# Patient Record
Sex: Female | Born: 1980 | Race: White | Hispanic: No | Smoking: Never smoker
Health system: Southern US, Community
[De-identification: ages and names within clinical notes are randomized; demographics above are authoritative.]

## PROBLEM LIST (undated history)

## (undated) DIAGNOSIS — F419 Anxiety disorder, unspecified: Secondary | ICD-10-CM

## (undated) DIAGNOSIS — F1020 Alcohol dependence, uncomplicated: Secondary | ICD-10-CM

## (undated) DIAGNOSIS — F319 Bipolar disorder, unspecified: Secondary | ICD-10-CM

## (undated) DIAGNOSIS — R569 Unspecified convulsions: Secondary | ICD-10-CM

## (undated) DIAGNOSIS — F4310 Post-traumatic stress disorder, unspecified: Secondary | ICD-10-CM

## (undated) DIAGNOSIS — F32A Depression, unspecified: Secondary | ICD-10-CM

## (undated) DIAGNOSIS — F329 Major depressive disorder, single episode, unspecified: Secondary | ICD-10-CM

## (undated) DIAGNOSIS — G44229 Chronic tension-type headache, not intractable: Secondary | ICD-10-CM

## (undated) HISTORY — PX: EXCISIONAL HEMORRHOIDECTOMY: SHX1541

## (undated) HISTORY — PX: OSTEOCHONDROMA EXCISION: SHX2137

## (undated) HISTORY — PX: REFRACTIVE SURGERY: SHX103

---

## 2013-12-31 ENCOUNTER — Inpatient Hospital Stay (HOSPITAL_COMMUNITY)
Admission: EM | Admit: 2013-12-31 | Discharge: 2014-01-02 | DRG: 641 | Disposition: A | Discharge status: Home / Self Care | Payer: Federal, State, Local not specified - PPO | Attending: Internal Medicine | Admitting: Internal Medicine

## 2013-12-31 ENCOUNTER — Encounter (HOSPITAL_COMMUNITY): Payer: Self-pay | Admitting: Oncology

## 2013-12-31 DIAGNOSIS — K219 Gastro-esophageal reflux disease without esophagitis: Secondary | ICD-10-CM | POA: Diagnosis present

## 2013-12-31 DIAGNOSIS — F1010 Alcohol abuse, uncomplicated: Secondary | ICD-10-CM | POA: Diagnosis present

## 2013-12-31 DIAGNOSIS — F10939 Alcohol use, unspecified with withdrawal, unspecified: Secondary | ICD-10-CM | POA: Diagnosis present

## 2013-12-31 DIAGNOSIS — E8729 Other acidosis: Secondary | ICD-10-CM

## 2013-12-31 DIAGNOSIS — E872 Acidosis, unspecified: Secondary | ICD-10-CM | POA: Diagnosis present

## 2013-12-31 DIAGNOSIS — F4310 Post-traumatic stress disorder, unspecified: Secondary | ICD-10-CM | POA: Diagnosis present

## 2013-12-31 DIAGNOSIS — G8929 Other chronic pain: Secondary | ICD-10-CM | POA: Diagnosis present

## 2013-12-31 DIAGNOSIS — G252 Other specified forms of tremor: Secondary | ICD-10-CM | POA: Diagnosis present

## 2013-12-31 DIAGNOSIS — F10239 Alcohol dependence with withdrawal, unspecified: Secondary | ICD-10-CM | POA: Diagnosis present

## 2013-12-31 DIAGNOSIS — F10980 Alcohol use, unspecified with alcohol-induced anxiety disorder: Secondary | ICD-10-CM | POA: Diagnosis present

## 2013-12-31 HISTORY — DX: Anxiety disorder, unspecified: F41.9

## 2013-12-31 HISTORY — DX: Post-traumatic stress disorder, unspecified: F43.10

## 2013-12-31 HISTORY — DX: Alcohol dependence, uncomplicated: F10.20

## 2013-12-31 LAB — CBC WITH DIFFERENTIAL/PLATELET
BASOS PCT: 1 % (ref 0–1)
Basophils Absolute: 0 10*3/uL (ref 0.0–0.1)
Eosinophils Absolute: 0 10*3/uL (ref 0.0–0.7)
Eosinophils Relative: 0 % (ref 0–5)
HCT: 40.6 % (ref 36.0–46.0)
HEMOGLOBIN: 14 g/dL (ref 12.0–15.0)
Lymphocytes Relative: 38 % (ref 12–46)
Lymphs Abs: 2.9 10*3/uL (ref 0.7–4.0)
MCH: 30.5 pg (ref 26.0–34.0)
MCHC: 34.5 g/dL (ref 30.0–36.0)
MCV: 88.5 fL (ref 78.0–100.0)
Monocytes Absolute: 0.5 10*3/uL (ref 0.1–1.0)
Monocytes Relative: 7 % (ref 3–12)
Neutro Abs: 4.2 10*3/uL (ref 1.7–7.7)
Neutrophils Relative %: 54 % (ref 43–77)
Platelets: 274 10*3/uL (ref 150–400)
RBC: 4.59 MIL/uL (ref 3.87–5.11)
RDW: 14.1 % (ref 11.5–15.5)
WBC: 7.7 10*3/uL (ref 4.0–10.5)

## 2013-12-31 LAB — URINE MICROSCOPIC-ADD ON

## 2013-12-31 LAB — BASIC METABOLIC PANEL
Anion gap: 30 — ABNORMAL HIGH (ref 5–15)
BUN: 18 mg/dL (ref 6–23)
CHLORIDE: 90 meq/L — AB (ref 96–112)
CO2: 16 mEq/L — ABNORMAL LOW (ref 19–32)
CREATININE: 0.51 mg/dL (ref 0.50–1.10)
Calcium: 9.8 mg/dL (ref 8.4–10.5)
GFR calc non Af Amer: >90 mL/min (ref >90)
Glucose, Bld: 82 mg/dL (ref 70–99)
Potassium: 4.7 mEq/L (ref 3.7–5.3)
Sodium: 136 mEq/L — ABNORMAL LOW (ref 137–147)

## 2013-12-31 LAB — RAPID URINE DRUG SCREEN, HOSP PERFORMED
AMPHETAMINES: NOT DETECTED
BENZODIAZEPINES: NOT DETECTED
Barbiturates: NOT DETECTED
Cocaine: NOT DETECTED
OPIATES: NOT DETECTED
Tetrahydrocannabinol: NOT DETECTED

## 2013-12-31 LAB — URINALYSIS, ROUTINE W REFLEX MICROSCOPIC
Bilirubin Urine: NEGATIVE
GLUCOSE, UA: NEGATIVE mg/dL
Ketones, ur: 40 mg/dL — AB
Leukocytes, UA: NEGATIVE
Nitrite: NEGATIVE
Protein, ur: >300 mg/dL — AB
SPECIFIC GRAVITY, URINE: 1.02 (ref 1.005–1.030)
UROBILINOGEN UA: 0.2 mg/dL (ref 0.0–1.0)
pH: 5 (ref 5.0–8.0)

## 2013-12-31 LAB — BLOOD GAS, ARTERIAL
Acid-base deficit: 6.1 mmol/L — ABNORMAL HIGH (ref 0.0–2.0)
Bicarbonate: 17 mEq/L — ABNORMAL LOW (ref 20.0–24.0)
DRAWN BY: 232811
FIO2: 0.21 %
O2 Saturation: 96.6 %
PCO2 ART: 28.3 mmHg — AB (ref 35.0–45.0)
PH ART: 7.398 (ref 7.350–7.450)
Patient temperature: 98.7
TCO2: 15.2 mmol/L (ref 0–100)
pO2, Arterial: 95.4 mmHg (ref 80.0–100.0)

## 2013-12-31 LAB — PREGNANCY, URINE: Preg Test, Ur: NEGATIVE

## 2013-12-31 LAB — I-STAT CG4 LACTIC ACID, ED: LACTIC ACID, VENOUS: 9.23 mmol/L — AB (ref 0.5–2.2)

## 2013-12-31 LAB — ETHANOL: Alcohol, Ethyl (B): 187 mg/dL — ABNORMAL HIGH (ref 0–11)

## 2013-12-31 MED ORDER — ONDANSETRON HCL 4 MG PO TABS: 4.0000 mg | ORAL_TABLET | Freq: Three times a day (TID) | ORAL | Status: DC | PRN

## 2013-12-31 MED ORDER — ALUM & MAG HYDROXIDE-SIMETH 200-200-20 MG/5ML PO SUSP: 30.0000 mL | ORAL | Status: DC | PRN

## 2013-12-31 MED ORDER — VITAMIN B-1 100 MG PO TABS
100.0000 mg | ORAL_TABLET | Freq: Every day | ORAL | Status: DC
  Administered 2013-12-31: 100 mg via ORAL
  Filled 2013-12-31: qty 1

## 2013-12-31 MED ORDER — SODIUM CHLORIDE 0.9 % IV BOLUS (SEPSIS)
1000.0000 mL | Freq: Once | INTRAVENOUS | Status: AC
  Administered 2013-12-31: 1000 mL via INTRAVENOUS

## 2013-12-31 MED ORDER — LORAZEPAM 1 MG PO TABS
1.0000 mg | ORAL_TABLET | Freq: Three times a day (TID) | ORAL | Status: DC | PRN
  Administered 2013-12-31: 1 mg via ORAL
  Filled 2013-12-31: qty 1

## 2013-12-31 MED ORDER — THIAMINE HCL 100 MG/ML IJ SOLN: 100.0000 mg | Freq: Every day | INTRAMUSCULAR | Status: DC

## 2013-12-31 MED ORDER — SODIUM CHLORIDE 0.9 % IV SOLN: INTRAVENOUS | Status: DC

## 2013-12-31 MED ORDER — NICOTINE 21 MG/24HR TD PT24: 21.0000 mg | MEDICATED_PATCH | Freq: Every day | TRANSDERMAL | Status: DC | PRN

## 2013-12-31 MED ORDER — LORAZEPAM 1 MG PO TABS: 0.0000 mg | ORAL_TABLET | Freq: Four times a day (QID) | ORAL | Status: DC

## 2013-12-31 MED ORDER — LORAZEPAM 1 MG PO TABS: 0.0000 mg | ORAL_TABLET | Freq: Two times a day (BID) | ORAL | Status: DC

## 2013-12-31 NOTE — ED Notes (Signed)
Per pt she is a recovering alcoholic and relapsed 2 weeks ago and has been drinking a fifth of vodka daily.  Pt denies any seizures with previous detox.  Pt reports that in the past she has had issues w/ tremors, high HR/BP and inability to ambulate.  Pt had her last drink today at 1400.  Pt is seeking help with detox.

## 2013-12-31 NOTE — ED Provider Notes (Signed)
CSN: [id number]     Arrival date & time 12/31/13  2044 History   First MD Initiated Contact with Patient 12/31/13 2107     Chief Complaint  Patient presents with  . Alcohol Problem    pt would like help w/ detox      HPI  Pt was seen at 2110. Per pt and her mother, c/o gradual onset and persistence of constant etoh abuse for the past 2 weeks. Pt states she has hx of alcohol abuse, but has been sober for the past 1 year. States she relapsed 2 weeks ago and has been drinking 1/5th of vodka per day and eating very little food. LD etoh approximately 1400 this afternoon. States she starts to withdraw from etoh approximately 4 hours after her last drink. Describes her withdrawal symptoms as "shaking," "nausea," "high HR/BP," "hallucinations," and "can't walk because I'm too shakey." States she is starting to feel all these symptoms now. Pt is requesting etoh detox. Denies hx of seizures related to her alcohol abuse. Denies CP/palpitations, no SOB/cough, no abd pain, no vomiting/diarrhea.      Past Medical History  Diagnosis Date  . Alcoholic   . Chronic back pain   . Anxiety   . Post traumatic stress disorder (PTSD)    Past Surgical History  Procedure Laterality Date  . Knee surgery Right     History  Substance Use Topics  . Smoking status: Never Smoker   . Smokeless tobacco: Not on file  . Alcohol Use: Yes     Comment: 1 fifth of vadka daily    Review of Systems ROS: Statement: All systems negative except as marked or noted in the HPI; Constitutional: Negative for fever and chills. +tremorous.; ; Eyes: Negative for eye pain, redness and discharge. ; ; ENMT: Negative for ear pain, hoarseness, nasal congestion, sinus pressure and sore throat. ; ; Cardiovascular: Negative for chest pain, palpitations, diaphoresis, dyspnea and peripheral edema. ; ; Respiratory: Negative for cough, wheezing and stridor. ; ; Gastrointestinal: +nausea. Negative for vomiting, diarrhea, abdominal pain, blood in  stool, hematemesis, jaundice and rectal bleeding. . ; ; Genitourinary: Negative for dysuria, flank pain and hematuria. ; ; Musculoskeletal: Negative for back pain and neck pain. Negative for swelling and trauma.; ; Skin: Negative for pruritus, rash, abrasions, blisters, bruising and skin lesion.; ; Neuro: Negative for headache, lightheadedness and neck stiffness. Negative for weakness, altered level of consciousness , altered mental status, extremity weakness, paresthesias, involuntary movement, seizure and syncope.; Psych:  +anxious. No SI, no SA, no HI, no hallucinations.   Allergies  Review of patient's allergies indicates no known allergies.  Home Medications   Prior to Admission medications   Medication Sig Start Date End Date Taking? Authorizing Provider  busPIRone (BUSPAR) 10 MG tablet Take 10 mg by mouth 3 (three) times daily.   Yes Historical Provider, MD  ibuprofen (ADVIL,MOTRIN) 200 MG tablet Take 400-600 mg by mouth every 6 (six) hours as needed for moderate pain.   Yes Historical Provider, MD  norethindrone-ethinyl estradiol (NECON,BREVICON,MODICON) 0.5-35 MG-MCG tablet Take 1 tablet by mouth daily.   Yes Historical Provider, MD  valACYclovir (VALTREX) 500 MG tablet Take 500 mg by mouth daily as needed (outbreaks).   Yes Historical Provider, MD   BP 130/86 mmHg  Pulse 113  Temp(Src) 98.6 F (37 C) (Oral)  Resp 16  Ht 5\' 4"  (1.626 m)  Wt 120 lb (54.432 kg)  BMI 20.59 kg/m2  SpO2 97%  LMP 12/17/2013 (Approximate) Physical Exam  2115: Physical examination:  Nursing notes reviewed; Vital signs and O2 SAT reviewed;  Constitutional: Well developed, Well nourished, Well hydrated, In no acute distress; Head:  Normocephalic, atraumatic; Eyes: EOMI, PERRL, No scleral icterus; ENMT: Mouth and pharynx normal, Mucous membranes moist; Neck: Supple, Full range of motion, No lymphadenopathy; Cardiovascular: Tachycardic rate and rhythm, No gallop; Respiratory: Breath sounds clear & equal  bilaterally, No rales, rhonchi, wheezes.  Speaking full sentences with ease, Normal respiratory effort/excursion; Chest: Nontender, Movement normal; Abdomen: Soft, Nontender, Nondistended, Normal bowel sounds; Genitourinary: No CVA tenderness; Extremities: Pulses normal, No tenderness, No edema, No calf edema or asymmetry.; Neuro: AA&Ox3, Major CN grossly intact.  Speech clear. No gross focal motor or sensory deficits in extremities.; Skin: Color normal, Warm, Dry.; Psych:  Anxious, shaking, tearful at times.     ED Course  Procedures     EKG Interpretation None      MDM  MDM Reviewed: nursing note and vitals Interpretation: labs   Results for orders placed or performed during the hospital encounter of 12/31/13  CBC with Differential  Result Value Ref Range   WBC 7.7 4.0 - 10.5 K/uL   RBC 4.59 3.87 - 5.11 MIL/uL   Hemoglobin 14.0 12.0 - 15.0 g/dL   HCT 16.1 09.6 - 04.5 %   MCV 88.5 78.0 - 100.0 fL   MCH 30.5 26.0 - 34.0 pg   MCHC 34.5 30.0 - 36.0 g/dL   RDW 40.9 81.1 - 91.4 %   Platelets 274 150 - 400 K/uL   Neutrophils Relative % 54 43 - 77 %   Neutro Abs 4.2 1.7 - 7.7 K/uL   Lymphocytes Relative 38 12 - 46 %   Lymphs Abs 2.9 0.7 - 4.0 K/uL   Monocytes Relative 7 3 - 12 %   Monocytes Absolute 0.5 0.1 - 1.0 K/uL   Eosinophils Relative 0 0 - 5 %   Eosinophils Absolute 0.0 0.0 - 0.7 K/uL   Basophils Relative 1 0 - 1 %   Basophils Absolute 0.0 0.0 - 0.1 K/uL  Basic metabolic panel  Result Value Ref Range   Sodium 136 (L) 137 - 147 mEq/L   Potassium 4.7 3.7 - 5.3 mEq/L   Chloride 90 (L) 96 - 112 mEq/L   CO2 16 (L) 19 - 32 mEq/L   Glucose, Bld 82 70 - 99 mg/dL   BUN 18 6 - 23 mg/dL   Creatinine, Ser 7.82 0.50 - 1.10 mg/dL   Calcium 9.8 8.4 - 95.6 mg/dL   GFR calc non Af Amer >90 >90 mL/min   GFR calc Af Amer >90 >90 mL/min   Anion gap 30 (H) 5 - 15  Urinalysis, Routine w reflex microscopic  Result Value Ref Range   Color, Urine YELLOW YELLOW   APPearance CLOUDY (A)  CLEAR   Specific Gravity, Urine 1.020 1.005 - 1.030   pH 5.0 5.0 - 8.0   Glucose, UA NEGATIVE NEGATIVE mg/dL   Hgb urine dipstick MODERATE (A) NEGATIVE   Bilirubin Urine NEGATIVE NEGATIVE   Ketones, ur 40 (A) NEGATIVE mg/dL   Protein, ur >213 (A) NEGATIVE mg/dL   Urobilinogen, UA 0.2 0.0 - 1.0 mg/dL   Nitrite NEGATIVE NEGATIVE   Leukocytes, UA NEGATIVE NEGATIVE  Pregnancy, urine  Result Value Ref Range   Preg Test, Ur NEGATIVE NEGATIVE  Urine rapid drug screen (hosp performed)  Result Value Ref Range   Opiates NONE DETECTED NONE DETECTED   Cocaine NONE DETECTED NONE DETECTED   Benzodiazepines NONE  DETECTED NONE DETECTED   Amphetamines NONE DETECTED NONE DETECTED   Tetrahydrocannabinol NONE DETECTED NONE DETECTED   Barbiturates NONE DETECTED NONE DETECTED  Ethanol  Result Value Ref Range   Alcohol, Ethyl (B) 187 (H) 0 - 11 mg/dL  Urine microscopic-add on  Result Value Ref Range   WBC, UA 3-6 <3 WBC/hpf   RBC / HPF 0-2 <3 RBC/hpf   Bacteria, UA RARE RARE   Casts GRANULAR CAST (A) NEGATIVE  Blood gas, arterial ([facility] ONLY)  Result Value Ref Range   FIO2 0.21 %   Delivery systems ROOM AIR    pH, Arterial 7.398 7.350 - 7.450   pCO2 arterial 28.3 (L) 35.0 - 45.0 mmHg   pO2, Arterial 95.4 80.0 - 100.0 mmHg   Bicarbonate 17.0 (L) 20.0 - 24.0 mEq/L   TCO2 15.2 0 - 100 mmol/L   Acid-base deficit 6.1 (H) 0.0 - 2.0 mmol/L   O2 Saturation 96.6 %   Patient temperature 98.7    Collection site LEFT BRACHIAL    Drawn by [id number]    Sample type ARTERIAL    Allens test (pass/fail) PASS PASS  Hepatic function panel  Result Value Ref Range   Total Protein 8.6 (H) 6.0 - 8.3 g/dL   Albumin 4.1 3.5 - 5.2 g/dL   AST 38 (H) 0 - 37 U/L   ALT 36 (H) 0 - 35 U/L   Alkaline Phosphatase 36 (L) 39 - 117 U/L   Total Bilirubin 0.4 0.3 - 1.2 mg/dL   Bilirubin, Direct <0.4<0.2 0.0 - 0.3 mg/dL   Indirect Bilirubin NOT CALCULATED 0.3 - 0.9 mg/dL  Lipase, blood  Result Value Ref Range   Lipase 44 11 -  59 U/L  Salicylate level  Result Value Ref Range   Salicylate Lvl <2.0 (L) 2.8 - 20.0 mg/dL  Acetaminophen level  Result Value Ref Range   Acetaminophen (Tylenol), Serum <15.0 10 - 30 ug/mL  I-Stat CG4 Lactic Acid, ED  Result Value Ref Range   Lactic Acid, Venous 9.23 (H) 0.5 - 2.2 mmol/L    2310:  TTS has evaluated pt and believes she will need inpt etoh detox, given her hx. Tachycardia improving from 130's to 110's after receiving ativan per CIWA protocol. Pt has also received thiamine. Labs have returned with elevated AG. Will medically admit. T/C to [facility] Dr. Allena KatzPatel, case discussed, including:  HPI, pertinent PM/SHx, VS/PE, dx testing, ED course and treatment:  Agreeable to come to ED for evaluation to admit.     Samuel JesterKathleen Dejay Kronk, DO 01/01/14 1336
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
## 2014-01-01 DIAGNOSIS — F10939 Alcohol use, unspecified with withdrawal, unspecified: Secondary | ICD-10-CM | POA: Diagnosis present

## 2014-01-01 DIAGNOSIS — G252 Other specified forms of tremor: Secondary | ICD-10-CM | POA: Diagnosis present

## 2014-01-01 DIAGNOSIS — F4310 Post-traumatic stress disorder, unspecified: Secondary | ICD-10-CM | POA: Diagnosis present

## 2014-01-01 DIAGNOSIS — F10239 Alcohol dependence with withdrawal, unspecified: Secondary | ICD-10-CM | POA: Diagnosis present

## 2014-01-01 DIAGNOSIS — F10980 Alcohol use, unspecified with alcohol-induced anxiety disorder: Secondary | ICD-10-CM | POA: Diagnosis present

## 2014-01-01 DIAGNOSIS — G8929 Other chronic pain: Secondary | ICD-10-CM | POA: Diagnosis present

## 2014-01-01 DIAGNOSIS — K219 Gastro-esophageal reflux disease without esophagitis: Secondary | ICD-10-CM | POA: Diagnosis present

## 2014-01-01 DIAGNOSIS — E872 Acidosis, unspecified: Secondary | ICD-10-CM | POA: Diagnosis present

## 2014-01-01 DIAGNOSIS — F1010 Alcohol abuse, uncomplicated: Secondary | ICD-10-CM

## 2014-01-01 LAB — CBC
HCT: 34.2 % — ABNORMAL LOW (ref 36.0–46.0)
Hemoglobin: 11.8 g/dL — ABNORMAL LOW (ref 12.0–15.0)
MCH: 30.6 pg (ref 26.0–34.0)
MCHC: 34.5 g/dL (ref 30.0–36.0)
MCV: 88.8 fL (ref 78.0–100.0)
Platelets: 202 10*3/uL (ref 150–400)
RBC: 3.85 MIL/uL — ABNORMAL LOW (ref 3.87–5.11)
RDW: 14.2 % (ref 11.5–15.5)
WBC: 7.2 10*3/uL (ref 4.0–10.5)

## 2014-01-01 LAB — HEPATIC FUNCTION PANEL
ALT: 36 U/L — ABNORMAL HIGH (ref 0–35)
AST: 38 U/L — AB (ref 0–37)
Albumin: 4.1 g/dL (ref 3.5–5.2)
Alkaline Phosphatase: 36 U/L — ABNORMAL LOW (ref 39–117)
BILIRUBIN TOTAL: 0.4 mg/dL (ref 0.3–1.2)
Bilirubin, Direct: <0.2 mg/dL (ref 0.0–0.3)
Total Protein: 8.6 g/dL — ABNORMAL HIGH (ref 6.0–8.3)

## 2014-01-01 LAB — COMPREHENSIVE METABOLIC PANEL
ALBUMIN: 3.2 g/dL — AB (ref 3.5–5.2)
ALK PHOS: 29 U/L — AB (ref 39–117)
ALT: 29 U/L (ref 0–35)
ANION GAP: 19 — AB (ref 5–15)
AST: 31 U/L (ref 0–37)
BILIRUBIN TOTAL: 0.6 mg/dL (ref 0.3–1.2)
BUN: 15 mg/dL (ref 6–23)
CALCIUM: 8.4 mg/dL (ref 8.4–10.5)
CO2: 20 meq/L (ref 19–32)
CREATININE: 0.46 mg/dL — AB (ref 0.50–1.10)
Chloride: 94 mEq/L — ABNORMAL LOW (ref 96–112)
GFR calc non Af Amer: >90 mL/min (ref >90)
Glucose, Bld: 73 mg/dL (ref 70–99)
Potassium: 4.1 mEq/L (ref 3.7–5.3)
SODIUM: 133 meq/L — AB (ref 137–147)
TOTAL PROTEIN: 6.7 g/dL (ref 6.0–8.3)

## 2014-01-01 LAB — PROTIME-INR
INR: 1.02 (ref 0.00–1.49)
Prothrombin Time: 13.5 seconds (ref 11.6–15.2)

## 2014-01-01 LAB — VOLATILES,BLD-ACETONE,ETHANOL,ISOPROP,METHANOL
Acetone, blood: NOT DETECTED
ETHANOL, BLOOD: 0.082
Isopropanol, blood: NOT DETECTED
Methanol, blood: NOT DETECTED

## 2014-01-01 LAB — SALICYLATE LEVEL: Salicylate Lvl: <2 mg/dL — ABNORMAL LOW (ref 2.8–20.0)

## 2014-01-01 LAB — LIPASE, BLOOD
Lipase: 44 U/L (ref 11–59)
Lipase: 57 U/L (ref 11–59)

## 2014-01-01 LAB — ACETAMINOPHEN LEVEL: Acetaminophen (Tylenol), Serum: <15 ug/mL (ref 10–30)

## 2014-01-01 LAB — OSMOLALITY: Osmolality: 338 mOsm/kg — ABNORMAL HIGH (ref 275–300)

## 2014-01-01 LAB — LACTIC ACID, PLASMA: Lactic Acid, Venous: 2.3 mmol/L — ABNORMAL HIGH (ref 0.5–2.2)

## 2014-01-01 MED ORDER — BUSPIRONE HCL 10 MG PO TABS
10.0000 mg | ORAL_TABLET | Freq: Three times a day (TID) | ORAL | Status: DC
  Administered 2014-01-01 – 2014-01-02 (×4): 10 mg via ORAL
  Filled 2014-01-01 (×5): qty 1

## 2014-01-01 MED ORDER — ONDANSETRON HCL 4 MG/2ML IJ SOLN: 4.0000 mg | Freq: Four times a day (QID) | INTRAMUSCULAR | Status: DC | PRN

## 2014-01-01 MED ORDER — VITAMIN B-1 100 MG PO TABS
100.0000 mg | ORAL_TABLET | Freq: Every day | ORAL | Status: DC
  Administered 2014-01-01 – 2014-01-02 (×2): 100 mg via ORAL
  Filled 2014-01-01 (×2): qty 1

## 2014-01-01 MED ORDER — ACETAMINOPHEN 650 MG RE SUPP: 650.0000 mg | Freq: Four times a day (QID) | RECTAL | Status: DC | PRN

## 2014-01-01 MED ORDER — SODIUM CHLORIDE 0.9 % IJ SOLN
3.0000 mL | Freq: Two times a day (BID) | INTRAMUSCULAR | Status: DC
  Administered 2014-01-01 – 2014-01-02 (×3): 3 mL via INTRAVENOUS

## 2014-01-01 MED ORDER — ONDANSETRON HCL 4 MG PO TABS: 4.0000 mg | ORAL_TABLET | Freq: Four times a day (QID) | ORAL | Status: DC | PRN

## 2014-01-01 MED ORDER — LORAZEPAM 2 MG/ML IJ SOLN: 0.0000 mg | Freq: Two times a day (BID) | INTRAMUSCULAR | Status: DC

## 2014-01-01 MED ORDER — PANTOPRAZOLE SODIUM 40 MG IV SOLR
40.0000 mg | Freq: Once | INTRAVENOUS | Status: AC
  Administered 2014-01-01: 40 mg via INTRAVENOUS
  Filled 2014-01-01: qty 40

## 2014-01-01 MED ORDER — SODIUM CHLORIDE 0.9 % IV BOLUS (SEPSIS)
1000.0000 mL | Freq: Once | INTRAVENOUS | Status: AC
  Administered 2014-01-01: 1000 mL via INTRAVENOUS

## 2014-01-01 MED ORDER — TRAMADOL HCL 50 MG PO TABS
50.0000 mg | ORAL_TABLET | Freq: Four times a day (QID) | ORAL | Status: DC | PRN
  Administered 2014-01-01: 50 mg via ORAL
  Filled 2014-01-01: qty 1

## 2014-01-01 MED ORDER — HEPARIN SODIUM (PORCINE) 5000 UNIT/ML IJ SOLN
5000.0000 [IU] | Freq: Three times a day (TID) | INTRAMUSCULAR | Status: DC
  Administered 2014-01-01 – 2014-01-02 (×4): 5000 [IU] via SUBCUTANEOUS
  Filled 2014-01-01 (×5): qty 1

## 2014-01-01 MED ORDER — FOLIC ACID 1 MG PO TABS
1.0000 mg | ORAL_TABLET | Freq: Every day | ORAL | Status: DC
  Administered 2014-01-01 – 2014-01-02 (×2): 1 mg via ORAL
  Filled 2014-01-01 (×2): qty 1

## 2014-01-01 MED ORDER — THIAMINE HCL 100 MG/ML IJ SOLN
Freq: Every day | INTRAVENOUS | Status: DC
  Administered 2014-01-01 – 2014-01-02 (×2): via INTRAVENOUS
  Filled 2014-01-01 (×2): qty 1000

## 2014-01-01 MED ORDER — LORAZEPAM 2 MG/ML IJ SOLN
1.0000 mg | Freq: Once | INTRAMUSCULAR | Status: AC
  Administered 2014-01-01: 1 mg via INTRAVENOUS

## 2014-01-01 MED ORDER — PANTOPRAZOLE SODIUM 40 MG PO TBEC
40.0000 mg | DELAYED_RELEASE_TABLET | Freq: Every day | ORAL | Status: DC
  Administered 2014-01-01 – 2014-01-02 (×2): 40 mg via ORAL
  Filled 2014-01-01 (×2): qty 1

## 2014-01-01 MED ORDER — ADULT MULTIVITAMIN W/MINERALS CH
1.0000 | ORAL_TABLET | Freq: Every day | ORAL | Status: DC
  Administered 2014-01-01 – 2014-01-02 (×2): 1 via ORAL
  Filled 2014-01-01 (×2): qty 1

## 2014-01-01 MED ORDER — ACETAMINOPHEN 325 MG PO TABS: 650.0000 mg | ORAL_TABLET | Freq: Four times a day (QID) | ORAL | Status: DC | PRN

## 2014-01-01 MED ORDER — LORAZEPAM 1 MG PO TABS: 1.0000 mg | ORAL_TABLET | Freq: Four times a day (QID) | ORAL | Status: DC | PRN

## 2014-01-01 MED ORDER — LORAZEPAM 2 MG/ML IJ SOLN
0.0000 mg | Freq: Four times a day (QID) | INTRAMUSCULAR | Status: DC
  Administered 2014-01-01 – 2014-01-02 (×3): 2 mg via INTRAVENOUS
  Filled 2014-01-01 (×5): qty 1

## 2014-01-01 MED ORDER — LORAZEPAM 2 MG/ML IJ SOLN
1.0000 mg | Freq: Four times a day (QID) | INTRAMUSCULAR | Status: DC | PRN
  Administered 2014-01-01 (×3): 1 mg via INTRAVENOUS
  Filled 2014-01-01 (×3): qty 1

## 2014-01-01 NOTE — Plan of Care (Signed)
Problem: Phase I Progression Outcomes Goal: Pain controlled with appropriate interventions Outcome: Completed/Met Date Met:  01/01/14     

## 2014-01-01 NOTE — H&P (Signed)
[facility] History and Physical  Patient: [name] Stewart  ZOX:[id number]  DOB: 05/24/1980  DOS: the patient was seen and examined on 01/01/2014 PCP: No primary care provider on file.  Chief Complaint: tremors and withdrawal  HPI: [name] Lollingrin Evon is a 33 y.o. female with Past medical history of alcohol abuse, anxiety, alcohol-induced pancreatitis history. The patient presented with alcohol withdrawal. She mentions that she was not taking any alcohol for last 1 year but she has recently moved and had a lot of stress and started drinking alcohol 2 weeks ago. She was drinking 1/5 of bottle of vodka on a daily basis. She denies any drug abuse or any smoking abuse. After drinking significant alcohol for a week she gradually decreased her alcohol intake and started having symptoms of withdrawal. She called her mother to visit her and in the meantime she was continuing to drink less alcohol. She started having symptoms of tremor and anxiety. She also complains of generalized abdominal pain as well as acid reflux. She had an episode of nausea as well as vomiting. She denies any shortness of breath cough or fever. She complains of some diarrhea and does not have any active bleeding. She denies any burning urination. She occasionally complains of dizziness.  The patient is coming from home And at her baseline independent for most of her ADL.  Review of Systems: as mentioned in the history of present illness.  A Comprehensive review of the other systems is negative.  Past Medical History  Diagnosis Date  . Alcoholic   . Chronic back pain   . Anxiety   . Post traumatic stress disorder (PTSD)    Past Surgical History  Procedure Laterality Date  . Knee surgery Right    Social History:  reports that she has never smoked. She does not have any smokeless tobacco history on file. She reports that she drinks alcohol. She reports that she does not use illicit drugs.  No Known Allergies  History reviewed. No  pertinent family history.  Prior to Admission medications   Medication Sig Start Date End Date Taking? Authorizing Provider  busPIRone (BUSPAR) 10 MG tablet Take 10 mg by mouth 3 (three) times daily.   Yes Historical Provider, MD  ibuprofen (ADVIL,MOTRIN) 200 MG tablet Take 400-600 mg by mouth every 6 (six) hours as needed for moderate pain.   Yes Historical Provider, MD  norethindrone-ethinyl estradiol (NECON,BREVICON,MODICON) 0.5-35 MG-MCG tablet Take 1 tablet by mouth daily.   Yes Historical Provider, MD  valACYclovir (VALTREX) 500 MG tablet Take 500 mg by mouth daily as needed (outbreaks).   Yes Historical Provider, MD    Physical Exam: Filed Vitals:   12/31/13 2127 01/01/14 0039 01/01/14 0052 01/01/14 0144  BP: 130/86 146/87 146/87 126/75  Pulse: 113 124 78 120  Temp:    98.5 F (36.9 C)  TempSrc:    Oral  Resp:  16  18  Height:    5\' 4"  (1.626 m)  Weight:    53.7 kg (118 lb 6.2 oz)  SpO2:  98%  98%    General: Alert, Awake and Oriented to Time, Place and Person. Appear in mild distress Eyes: PERRL ENT: Oral Mucosa clear moist. Neck: no JVD Cardiovascular: S1 and S2 Present, no Murmur, Peripheral Pulses Present Respiratory: Bilateral Air entry equal and Decreased, Clear to Auscultation, noCrackles, no wheezes Abdomen: Bowel Sound present, Soft and no tender Skin: no Rash Extremities: no Pedal edema, no calf tenderness Neurologic: Grossly no focal neuro deficit other than tremors  Labs on Admission:  CBC:  Recent Labs Lab 12/31/13 2137  WBC 7.7  NEUTROABS 4.2  HGB 14.0  HCT 40.6  MCV 88.5  PLT 274    CMP     Component Value Date/Time   NA 136* 12/31/2013 2137   K 4.7 12/31/2013 2137   CL 90* 12/31/2013 2137   CO2 16* 12/31/2013 2137   GLUCOSE 82 12/31/2013 2137   BUN 18 12/31/2013 2137   CREATININE 0.51 12/31/2013 2137   CALCIUM 9.8 12/31/2013 2137   PROT 8.6* 12/31/2013 2313   ALBUMIN 4.1 12/31/2013 2313   AST 38* 12/31/2013 2313   ALT 36*  12/31/2013 2313   ALKPHOS 36* 12/31/2013 2313   BILITOT 0.4 12/31/2013 2313   GFRNONAA >90 12/31/2013 2137   GFRAA >90 12/31/2013 2137     Recent Labs Lab 12/31/13 2313  LIPASE 44   No results for input(s): AMMONIA in the last 168 hours.  No results for input(s): CKTOTAL, CKMB, CKMBINDEX, TROPONINI in the last 168 hours. BNP (last 3 results) No results for input(s): PROBNP in the last 8760 hours.  Radiological Exams on Admission: No results found.  EKG: Independently reviewed. sinus tachycardia.  Assessment/Plan Principal Problem:   Metabolic acidosis Active Problems:   Alcohol withdrawal   Alcohol abuse   GERD (gastroesophageal reflux disease)   Coarse tremors   1. Metabolic acidosis The patient is presenting with complaints of alcohol withdrawal symptoms with tremors and anxiety. She requested detoxification in the ER. On further workup she was found to have metabolic acidosis with increased lactic acid level and sinus tachycardia. With this the patient is currently being admitted in the hospital on telemetry. I would continue hydrating her aggressively. Placing her on banana bag with thiamine and multivitamin as well as folic acid. Recheck lactic acid levels in the morning. Most likely etiology of her lactic acidosis is alcohol intake. Her LFTs are within normal limits suggesting no acute liver failure. Continue close monitoring.  2. Alcohol withdrawal. Placing the patient on CIWA protocol for alcohol withdrawal.  3. GERD. Protonix one dose IV and continuation of Protonix oral. Zofran as needed.  Advance goals of care discussion: Full code    DVT Prophylaxis: subcutaneous Heparin Nutrition: Liquid diet  Family Communication: Mother was present at bedside, opportunity was given to ask question and all questions were answered satisfactorily at the time of interview. Disposition: Admitted to inpatient in telemetry unit.  Author: Lynden OxfordPranav Patel, MD [facility] Pager: [phone] 01/01/2014,     If 7PM-7AM, please contact night-coverage [email or web address] Password [email or web address]

## 2014-01-01 NOTE — ED Notes (Signed)
Hospitalist at bedside 

## 2014-01-01 NOTE — Plan of Care (Signed)
Problem: Phase III Progression Outcomes Goal: Voiding independently Outcome: Completed/Met Date Met:  01/01/14     

## 2014-01-01 NOTE — Clinical Social Work Note (Signed)
   CSW met with pt and her mother at bedside.  Pt's mother flew in from [place] last night when pt was admitted to the ER  Pt recently moved to [place] from [place], got a new job and bought a new house.  Pt lives alone for the first time and stated that the stress of all the new things caused her fatigue and she did not work out which helps with her stress and anxiety.  Pt stated that she was taking medication for anxiety and stopped it when she began drinking about a week ago.  Pt likes her new job and home in [place] and wants to begin to work with a Social worker for her anxiety  CSW presented pt with a list of resources and outpatient treatment and pt agreed to call for an appointment before leaving the hospital  CSW will follow up if needed  .Dede Query, LCSW [facility] Clinical Social Worker - Weekend Coverage cell #: [phone]

## 2014-01-01 NOTE — Plan of Care (Signed)
Problem: Phase I Progression Outcomes Goal: Voiding-avoid urinary catheter unless indicated Outcome: Completed/Met Date Met:  01/01/14     

## 2014-01-01 NOTE — Plan of Care (Signed)
Problem: Phase III Progression Outcomes Goal: Foley discontinued Outcome: Not Applicable Date Met:  01/01/14     

## 2014-01-01 NOTE — Plan of Care (Signed)
Problem: Phase I Progression Outcomes Goal: OOB as tolerated unless otherwise ordered Outcome: Completed/Met Date Met:  01/01/14

## 2014-01-01 NOTE — Plan of Care (Signed)
Problem: Phase II Progression Outcomes Goal: Obtain order to discontinue catheter if appropriate Outcome: Not Applicable Date Met:  92/76/39

## 2014-01-01 NOTE — Progress Notes (Signed)
Pt admitted after midnight for alcohol withdrawal. Pt seen and examined at bedside this AM. She is hemodynamically stable with mild tremors on exam this AM. Will continue to keep on CIWA protocol. Repeat CMET and CBC in AM.  Debbora PrestoMAGICK-Lesbia Ottaway, MD  [facility] Pager [phone]  If 7PM-7AM, please contact night-coverage [email or web address] Password [email or web address]

## 2014-01-01 NOTE — Plan of Care (Signed)
Problem: Phase II Progression Outcomes Goal: Vital signs remain stable Outcome: Completed/Met Date Met:  01/01/14     

## 2014-01-01 NOTE — Progress Notes (Signed)
Patient reports feeling miserable. Feeling sweaty and having hallucinations. "Thought I was dreaming when I woke up and saw dolls all around in the room.". + tremors/restless. C/o numbness/itching on face. Paged MD oncall Dr Alvester MorinNewton and order received for additional ativan.

## 2014-01-01 NOTE — Plan of Care (Signed)
Problem: Phase II Progression Outcomes Goal: IV changed to normal saline lock Outcome: Completed/Met Date Met:  01/01/14     

## 2014-01-01 NOTE — Clinical Social Work Psychosocial (Signed)
  Clinical Social Work Department BRIEF PSYCHOSOCIAL ASSESSMENT 01/01/2014  Patient:  [name] Stewart, [name] Stewart     Account Number:  [id number]     Admit date:  12/31/2013  Clinical Social Worker:  Dede Query, CLINICAL SOCIAL WORKER  Date/Time:  01/01/2014 04:17 PM  Referred by:  Physician  Date Referred:  01/01/2014 Referred for  Substance Abuse   Other Referral:   Interview type:  Patient Other interview type:   and her mother Dorian Pod    PSYCHOSOCIAL DATA Living Status:  ALONE Admitted from facility:   Level of care:   Primary support name:  ellen barry Primary support relationship to patient:  PARENT Degree of support available:   high    CURRENT CONCERNS  Other Concerns:    SOCIAL WORK ASSESSMENT / PLAN CSW met with pt and her mother at bedside.  Pt discussed her moving from [place] to [place] about a month ago to a new job and also buying a new house.  Pt admits that she used to have problems with alcohol but had not done any drinking for a year and with the move, new job and new house her stress caused her anxiety and she stopped working out and began drinking.  Pt stated that she had been taking a medication for anxiety but when she began to drink she stopped her medication as well.  Pt stated that she loves her new job and home and she wants to "get myself straight" so I don't loose the house or the job.  Pt also went through a recent breakup with her boyfriend who she left in [place].  Pt's mother discussed her daughter's abilities and praised her for all that she does.  Pt's mother discussed having her own panic attacks in the past and stated that she "knows how they feel".  Pt is open to outpatient counseling for mental health and alcohol but stated that her anxiety is what she really needs help with. CSW provided pt with a list of outpatient treatment programs that she can attend when she is discharged.  Pt  and her mother stated that she would call these programs tomorrow so that she could  get an appointment upon discharge.  Pt's mother flew in from [place] and plans to stay for another day hoping her daughter will be discharged tomorrow.   Assessment/plan status:   Other assessment/ plan:   Information/referral to community resources:   provided pt with a list of outpatient substance/alcholol treatment services    PATIENT'S/FAMILY'S RESPONSE TO PLAN OF CARE: Pt was curled up in a ball when CSW first entered the room. Pt was open to talking about what was going on in her life and appeared to recognize her stressors and the fact that she does not have a support system in place here since she just moved here a month ago.  Pt appears open to seeking treatment and continuing her course of anxiety medication. Pt stated that she was doing too much and exhausted herself which left her unable to work out and this is usually her stress relief and helps with her anxiety.  Pt was sitting up and appeared more alert at end of interview.  Pt and her mother stated they appreciated the help with resources and would follow up with scheduling outpatient appointment tomorrow so that pt could have an appoiintment upon discharge. Marland Kitchen   Dede Query, LCSW [place] Worker - Weekend Coverage cell #: [phone]

## 2014-01-02 LAB — COMPREHENSIVE METABOLIC PANEL
ALT: 46 U/L — ABNORMAL HIGH (ref 0–35)
ANION GAP: 14 (ref 5–15)
AST: 54 U/L — ABNORMAL HIGH (ref 0–37)
Albumin: 3.5 g/dL (ref 3.5–5.2)
Alkaline Phosphatase: 40 U/L (ref 39–117)
BILIRUBIN TOTAL: 1 mg/dL (ref 0.3–1.2)
BUN: 16 mg/dL (ref 6–23)
CHLORIDE: 97 meq/L (ref 96–112)
CO2: 26 mEq/L (ref 19–32)
CREATININE: 0.51 mg/dL (ref 0.50–1.10)
Calcium: 9.7 mg/dL (ref 8.4–10.5)
GFR calc Af Amer: >90 mL/min (ref >90)
GLUCOSE: 127 mg/dL — AB (ref 70–99)
Potassium: 3.8 mEq/L (ref 3.7–5.3)
Sodium: 137 mEq/L (ref 137–147)
Total Protein: 7.5 g/dL (ref 6.0–8.3)

## 2014-01-02 LAB — CBC
HCT: 39.6 % (ref 36.0–46.0)
Hemoglobin: 13.3 g/dL (ref 12.0–15.0)
MCH: 30.4 pg (ref 26.0–34.0)
MCHC: 33.6 g/dL (ref 30.0–36.0)
MCV: 90.4 fL (ref 78.0–100.0)
Platelets: 203 10*3/uL (ref 150–400)
RBC: 4.38 MIL/uL (ref 3.87–5.11)
RDW: 13.8 % (ref 11.5–15.5)
WBC: 5.2 10*3/uL (ref 4.0–10.5)

## 2014-01-02 MED ORDER — LORAZEPAM 1 MG PO TABS: 1.0000 mg | ORAL_TABLET | Freq: Three times a day (TID) | ORAL | Status: DC

## 2014-01-02 MED ORDER — ONDANSETRON HCL 4 MG PO TABS: 4.0000 mg | ORAL_TABLET | Freq: Four times a day (QID) | ORAL | Status: DC | PRN

## 2014-01-02 MED ORDER — TRAMADOL HCL 50 MG PO TABS: 50.0000 mg | ORAL_TABLET | Freq: Four times a day (QID) | ORAL | Status: DC | PRN

## 2014-01-02 NOTE — Care Management Note (Signed)
    Page 1 of 1   01/02/2014     12:20:26 PM CARE MANAGEMENT NOTE 01/02/2014  Patient:  [name] Stewart Stewart,[name] Stewart   Account Number:  [id number]  Date Initiated:  01/02/2014  Documentation initiated by:  Lanier ClamMAHABIR,Kimani Hovis  Subjective/Objective Assessment:   33 Y/O F ADMITTED W/ETOH.ZO:XWRUHX:ETOH ABUSE.     Action/Plan:   FROM HOME.STATES SHE HAS HEALTH INSURANCE-BCBS.   Anticipated DC Date:  01/02/2014   Anticipated DC Plan:  HOME/SELF CARE  In-house referral  PCP / [facility]  Clinical Social Worker      DC Planning Services  CM consult      Choice offered to / List presented to:             Status of service:  Completed, signed off Medicare Important Message given?   (If response is "NO", the following Medicare IM given date fields will be blank) Date Medicare IM given:   Medicare IM given by:   Date Additional Medicare IM given:   Additional Medicare IM given by:    Discharge Disposition:  HOME/SELF CARE  Per UR Regulation:  Reviewed for med. necessity/level of care/duration of stay  If discussed at Long Length of Stay Meetings, dates discussed:    Comments:  01/02/14 Giacomo Valone RN,BSN NCM [phone] SPOKE TO PATIENT/MOTHER IN RM. STATED THAT SHE HAS HEALTH INSURANCE-BCBS-ENCOURAGED TO CONTACT ADMITTING TO INFORM OF HEALTH INSURANCE & TO PROVIDE THEM W/INFO NEEDED.MOTHER WILL F/U.PROVIDED W/PCP LISTING, $[facility] MED LIST.CSW FOLLOWING FOR OTPT RESOURCES. NO FURTHER D/C NEEDS.

## 2014-01-02 NOTE — Discharge Instructions (Signed)
Alcohol and Nutrition °Nutrition serves two purposes. It provides energy. It also maintains body structure and function. Food supplies energy. It also provides the building blocks needed to replace worn or damaged cells. Alcoholics often eat poorly. This limits their supply of essential nutrients. This affects energy supply and structure maintenance. Alcohol also affects the body's nutrients in: °· Digestion. °· Storage. °· Using and getting rid of waste products. °IMPAIRMENT OF NUTRIENT DIGESTION AND UTILIZATION  °· Once ingested, food must be broken down into small components (digested). Then it is available for energy. It helps maintain body structure and function. Digestion begins in the mouth. It continues in the stomach and intestines, with help from the pancreas. The nutrients from digested food are absorbed from the intestines into the blood. Then they are carried to the liver. The liver prepares nutrients for: °¨ Immediate use. °¨ Storage and future use. °· Alcohol inhibits the breakdown of nutrients into usable molecules. °¨ It decreases secretion of digestive enzymes from the pancreas. °· Alcohol impairs nutrient absorption by damaging the cells lining the stomach and intestines. °· It also interferes with moving some nutrients into the blood. °· In addition, nutritional deficiencies themselves may lead to further absorption problems. °¨ For example, folate deficiency changes the cells that line the small intestine. This impairs how water is absorbed. It also affects absorbed nutrients. These include glucose, sodium, and additional folate. °· Even if nutrients are digested and absorbed, alcohol can prevent them from being fully used. It changes their transport, storage, and excretion. Impaired utilization of nutrients by alcoholics is indicated by: °¨ Decreased liver stores of vitamins, such as vitamin A. °¨ Increased excretion of nutrients such as fat. °ALCOHOL AND ENERGY SUPPLY  °· Three basic  nutritional components found in food are: °¨ Carbohydrates. °¨ Proteins. °¨ Fats. °· These are used as energy. Some alcoholics take in as much as 50% of their total daily calories from alcohol. They often neglect important foods. °· Even when enough food is eaten, alcohol can impair the ways the body controls blood sugar (glucose) levels. It may either increase or decrease blood sugar. °¨ In non-diabetic alcoholics, increased blood sugar (hyperglycemia) is caused by poor insulin secretion. It is usually temporary. °¨ Decreased blood sugar (hypoglycemia) can cause serious injury even if this condition is short-lived. Low blood sugar can happen when a fasting or malnourished person drinks alcohol. When there is no food to supply energy, stored sugar is used up. The products of alcohol inhibit forming glucose from other compounds such as amino acids. As a result, alcohol causes the brain and other body tissue to lack glucose. It is needed for energy and function. °· Alcohol is an energy source. But how the body processes and uses the energy from alcohol is complex. Also, when alcohol is substituted for carbohydrates, subjects tend to lose weight. This indicates that they get less energy from alcohol than from food. °ALCOHOL - MAINTAINING CELL STRUCTURE AND FUNCTION  °Structure °Cells are made mostly of protein. So an adequate protein diet is important for maintaining cell structure. This is especially true if cells are being damaged. Research indicates that alcohol affects protein nutrition by causing impaired: °· Digestion of proteins to amino acids. °· Processing of amino acids by the small intestine and liver. °· Synthesis of proteins from amino acids. °· Protein secretion by the liver. °Function °Nutrients are essential for the body to function well. They provide the tools that the body needs to work well:  °·   Proteins. °· Vitamins. °· Minerals. °Alcohol can disrupt body function. It may cause nutrient  deficiencies. And it may interfere with the way nutrients are processed. °Vitamins °· Vitamins are essential to maintain growth and normal metabolism. They regulate many of the body`s processes. Chronic heavy drinking causes deficiencies in many vitamins. This is caused by eating less. And, in some cases, vitamins may be poorly absorbed. For example, alcohol inhibits fat absorption. It impairs how the vitamins A, E, and D are normally absorbed along with dietary fats. Not enough vitamin A may cause night blindness. Not enough vitamin D may cause softening of the bones. °· Some alcoholics lack vitamins A, C, D, E, K, and the B vitamins. These are all involved in wound healing and cell maintenance. In particular, because vitamin K is necessary for blood clotting, lacking that vitamin can cause delayed clotting. The result is excess bleeding. Lacking other vitamins involved in brain function may cause severe neurological damage. °Minerals °Deficiencies of minerals such as calcium, magnesium, iron, and zinc are common in alcoholics. The alcohol itself does not seem to affect how these minerals are absorbed. Rather, they seem to occur secondary to other alcohol-related problems, such as: °· Less calcium absorbed. °· Not enough magnesium. °· More urinary excretion. °· Vomiting. °· Diarrhea. °· Not enough iron due to gastrointestinal bleeding. °· Not enough zinc or losses related to other nutrient deficiencies. °· Mineral deficiencies can cause a variety of medical consequences. These range from calcium-related bone disease to zinc-related night blindness and skin lesions. °ALCOHOL, MALNUTRITION, AND MEDICAL COMPLICATIONS  °Liver Disease  °· Alcoholic liver damage is caused primarily by alcohol itself. But poor nutrition may increase the risk of alcohol-related liver damage. For example, nutrients normally found in the liver are known to be affected by drinking alcohol. These include carotenoids, which are the major  sources of vitamin A, and vitamin E compounds. Decreases in such nutrients may play some role in alcohol-related liver damage. °Pancreatitis °· Research suggests that malnutrition may increase the risk of developing alcoholic pancreatitis. Research suggests that a diet lacking in protein may increase alcohol's damaging effect on the pancreas. °Brain °· Nutritional deficiencies may have severe effects on brain function. These may be permanent. Specifically, thiamine deficiencies are often seen in alcoholics. They can cause severe neurological problems. These include: °¨ Impaired movement. °¨ Memory loss seen in Wernicke-Korsakoff syndrome. °Pregnancy °· Alcohol has toxic effects on fetal development. It causes alcohol-related birth defects. They include fetal alcohol syndrome. Alcohol itself is toxic to the fetus. Also, the nutritional deficiency can affect how the fetus develops. That may compound the risk of developmental damage. °· Nutritional needs during pregnancy are 10% to 30% greater than normal. Food intake can increase by as much as 140% to cover the needs of both mother and fetus. An alcoholic mother`s nutritional problems may adversely affect the nutrition of the fetus. And alcohol itself can also restrict nutrition flow to the fetus. °NUTRITIONAL STATUS OF ALCOHOLICS  °Techniques for assessing nutritional status include: °· Taking body measurements to estimate fat reserves. They include: °¨ Weight. °¨ Height. °¨ Mass. °¨ Skin fold thickness. °· Performing blood analysis to provide measurements of circulating: °¨ Proteins. °¨ Vitamins. °¨ Minerals. °· These techniques tend to be imprecise. For many nutrients, there is no clear "cut-off" point that would allow an accurate definition of deficiency. So assessing the nutritional status of alcoholics is limited by these techniques. Dietary status may provide information about the risk of developing nutritional problems.   Dietary status is assessed by: °¨ Taking  patients' dietary histories. °¨ Evaluating the amount and types of food they are eating. °· It is difficult to determine what exact amount of alcohol begins to have damaging effects on nutrition. In general, moderate drinkers have 2 drinks or less per day. They seem to be at little risk for nutritional problems. Various medical disorders begin to appear at greater levels. °· Research indicates that the majority of even the heaviest drinkers have few obvious nutritional deficiencies. Many alcoholics who are hospitalized for medical complications of their disease do have severe malnutrition. Alcoholics tend to eat poorly. Often they eat less than the amounts of food necessary to provide enough: °¨ Carbohydrates. °¨ Protein. °¨ Fat. °¨ Vitamins A and C. °¨ B vitamins. °¨ Minerals like calcium and iron. °Of major concern is alcohol's effect on digesting food and use of nutrients. It may shift a mildly malnourished person toward severe malnutrition. °Document Released: 11/07/2004 Document Revised: 04/07/2011 Document Reviewed: 04/23/2005 °ExitCare® Patient Information ©2015 [employer]. This information is not intended to replace advice given to you by your health care provider. Make sure you discuss any questions you have with your health care provider. ° °

## 2014-01-02 NOTE — Discharge Summary (Signed)
Physician Discharge Summary  [name] Stewart ZOX:[id number] DOB: 01/20/1981 DOA: 12/31/2013  PCP: No primary care provider on file.  Admit date: 12/31/2013 Discharge date: 01/02/2014  Recommendations for Outpatient Follow-up:  1. Pt will need to follow up with PCP in 2-3 weeks post discharge 2. Please obtain CMP to evaluate electrolytes and kidney function 3. Please also check CBC to evaluate Hg and Hct levels  Discharge Diagnoses:  Principal Problem:   Metabolic acidosis Active Problems:   Alcohol withdrawal   Alcohol abuse   GERD (gastroesophageal reflux disease)   Coarse tremors    Discharge Condition: Stable  Diet recommendation: Heart healthy diet discussed in details   History of present illness:  33 y.o. female with Past medical history of alcohol abuse, anxiety, alcohol-induced pancreatitis history. The patient presented with alcohol withdrawal. She was drinking 1/5 of bottle of vodka on a daily basis. She denies any drug abuse or any smoking abuse.  Hospital Course:  Principal Problem:   Metabolic acidosis - secondary to alcohol induced dehydration, starvation ketoacidosis  - resolved with IVF and oral intake  Active Problems:   Alcohol withdrawal - resolved    Alcohol abuse - pt willing to continue outpatient rehab    GERD (gastroesophageal reflux disease) - stable   Procedures/Studies:  None  Consultations:  None Antibiotics:  None   Discharge Exam: Filed Vitals:   01/02/14 0455  BP: 129/80  Pulse: 84  Temp: 98 F (36.7 C)  Resp: 20   Filed Vitals:   01/01/14 2040 01/01/14 2148 01/02/14 0455 01/02/14 0700  BP:  109/71 129/80   Pulse:  86 84   Temp:  98.1 F (36.7 C) 98 F (36.7 C)   TempSrc:  Oral Oral   Resp:  20 20   Height:      Weight:    52.1 kg (114 lb 13.8 oz)  SpO2: 96% 98% 99%     General: Pt is alert, follows commands appropriately, not in acute distress Cardiovascular: Regular rate and rhythm, S1/S2 +, no murmurs, no rubs, no  gallops Respiratory: Clear to auscultation bilaterally, no wheezing, no crackles, no rhonchi Abdominal: Soft, non tender, non distended, bowel sounds +, no guarding Extremities: no edema, no cyanosis, pulses palpable bilaterally DP and PT Neuro: Grossly nonfocal  Discharge Instructions  Discharge Instructions    Diet - low sodium heart healthy    Complete by:  As directed      Increase activity slowly    Complete by:  As directed             Medication List    TAKE these medications        busPIRone 10 MG tablet  Commonly known as:  BUSPAR  Take 10 mg by mouth 3 (three) times daily.     ibuprofen 200 MG tablet  Commonly known as:  ADVIL,MOTRIN  Take 400-600 mg by mouth every 6 (six) hours as needed for moderate pain.     LORazepam 1 MG tablet  Commonly known as:  ATIVAN  Take 1 tablet (1 mg total) by mouth every 8 (eight) hours.     norethindrone-ethinyl estradiol 0.5-35 MG-MCG tablet  Commonly known as:  NECON,BREVICON,MODICON  Take 1 tablet by mouth daily.     ondansetron 4 MG tablet  Commonly known as:  ZOFRAN  Take 1 tablet (4 mg total) by mouth every 6 (six) hours as needed for nausea.     traMADol 50 MG tablet  Commonly known as:  Janean SarkULTRAM  Take 1 tablet (50 mg total) by mouth every 6 (six) hours as needed for moderate pain.     valACYclovir 500 MG tablet  Commonly known as:  VALTREX  Take 500 mg by mouth daily as needed (outbreaks).          The results of significant diagnostics from this hospitalization (including imaging, microbiology, ancillary and laboratory) are listed below for reference.     Microbiology: No results found for this or any previous visit (from the past 240 hour(s)).   Labs: Basic Metabolic Panel:  Recent Labs Lab 12/31/13 2137 01/01/14 0507 01/02/14 0435  NA 136* 133* 137  K 4.7 4.1 3.8  CL 90* 94* 97  CO2 16* 20 26  GLUCOSE 82 73 127*  BUN 18 15 16   CREATININE 0.51 0.46* 0.51  CALCIUM 9.8 8.4 9.7   Liver  Function Tests:  Recent Labs Lab 12/31/13 2313 01/01/14 0507 01/02/14 0435  AST 38* 31 54*  ALT 36* 29 46*  ALKPHOS 36* 29* 40  BILITOT 0.4 0.6 1.0  PROT 8.6* 6.7 7.5  ALBUMIN 4.1 3.2* 3.5    Recent Labs Lab 12/31/13 2313 01/01/14 0507  LIPASE 44 57   No results for input(s): AMMONIA in the last 168 hours. CBC:  Recent Labs Lab 12/31/13 2137 01/01/14 0507 01/02/14 0435  WBC 7.7 7.2 5.2  NEUTROABS 4.2  --   --   HGB 14.0 11.8* 13.3  HCT 40.6 34.2* 39.6  MCV 88.5 88.8 90.4  PLT 274 202 203   Cardiac Enzymes: No results for input(s): CKTOTAL, CKMB, CKMBINDEX, TROPONINI in the last 168 hours. BNP: BNP (last 3 results) No results for input(s): PROBNP in the last 8760 hours. CBG: No results for input(s): GLUCAP in the last 168 hours.   SIGNED: Time coordinating discharge: Over 30 minutes  Debbora PrestoMAGICK-Kellianne Ek, MD  [facility] 01/02/2014, 11:18 AM Pager [phone]  If 7PM-7AM, please contact night-coverage [email or web address] Password [email or web address]

## 2014-06-16 ENCOUNTER — Encounter (HOSPITAL_COMMUNITY): Payer: Self-pay | Admitting: Emergency Medicine

## 2014-06-16 ENCOUNTER — Emergency Department (HOSPITAL_COMMUNITY)
Admission: EM | Admit: 2014-06-16 | Discharge: 2014-06-16 | Disposition: A | Discharge status: Home / Self Care | Payer: Federal, State, Local not specified - PPO | Attending: Emergency Medicine | Admitting: Emergency Medicine

## 2014-06-16 ENCOUNTER — Emergency Department (HOSPITAL_COMMUNITY): Payer: Federal, State, Local not specified - PPO

## 2014-06-16 DIAGNOSIS — F419 Anxiety disorder, unspecified: Secondary | ICD-10-CM | POA: Insufficient documentation

## 2014-06-16 DIAGNOSIS — Z793 Long term (current) use of hormonal contraceptives: Secondary | ICD-10-CM | POA: Diagnosis not present

## 2014-06-16 DIAGNOSIS — F4310 Post-traumatic stress disorder, unspecified: Secondary | ICD-10-CM | POA: Diagnosis not present

## 2014-06-16 DIAGNOSIS — R000 Tachycardia, unspecified: Secondary | ICD-10-CM | POA: Insufficient documentation

## 2014-06-16 DIAGNOSIS — F10129 Alcohol abuse with intoxication, unspecified: Secondary | ICD-10-CM | POA: Diagnosis present

## 2014-06-16 DIAGNOSIS — G8929 Other chronic pain: Secondary | ICD-10-CM | POA: Diagnosis not present

## 2014-06-16 DIAGNOSIS — F1010 Alcohol abuse, uncomplicated: Secondary | ICD-10-CM

## 2014-06-16 DIAGNOSIS — R079 Chest pain, unspecified: Secondary | ICD-10-CM | POA: Insufficient documentation

## 2014-06-16 DIAGNOSIS — Z79899 Other long term (current) drug therapy: Secondary | ICD-10-CM | POA: Insufficient documentation

## 2014-06-16 LAB — BASIC METABOLIC PANEL
ANION GAP: 12 (ref 5–15)
BUN: 14 mg/dL (ref 6–20)
CALCIUM: 8.5 mg/dL — AB (ref 8.9–10.3)
CO2: 23 mmol/L (ref 22–32)
CREATININE: 0.41 mg/dL — AB (ref 0.44–1.00)
Chloride: 103 mmol/L (ref 101–111)
GFR calc Af Amer: >60 mL/min (ref >60)
Glucose, Bld: 133 mg/dL — ABNORMAL HIGH (ref 65–99)
Potassium: 3.5 mmol/L (ref 3.5–5.1)
Sodium: 138 mmol/L (ref 135–145)

## 2014-06-16 LAB — CBC
HCT: 37.6 % (ref 36.0–46.0)
Hemoglobin: 12.8 g/dL (ref 12.0–15.0)
MCH: 30.8 pg (ref 26.0–34.0)
MCHC: 34 g/dL (ref 30.0–36.0)
MCV: 90.6 fL (ref 78.0–100.0)
PLATELETS: 270 10*3/uL (ref 150–400)
RBC: 4.15 MIL/uL (ref 3.87–5.11)
RDW: 16.4 % — ABNORMAL HIGH (ref 11.5–15.5)
WBC: 5.6 10*3/uL (ref 4.0–10.5)

## 2014-06-16 LAB — I-STAT TROPONIN, ED: Troponin i, poc: 0 ng/mL (ref 0.00–0.08)

## 2014-06-16 MED ORDER — CHLORDIAZEPOXIDE HCL 25 MG PO CAPS: ORAL_CAPSULE | ORAL | Status: DC

## 2014-06-16 MED ORDER — LORAZEPAM 2 MG/ML IJ SOLN
1.0000 mg | Freq: Once | INTRAMUSCULAR | Status: AC
  Administered 2014-06-16: 1 mg via INTRAMUSCULAR
  Filled 2014-06-16: qty 1

## 2014-06-16 NOTE — ED Notes (Signed)
Pt from home c/o  Central chest pain and requesting detox form alcohol.  She reports last drink was about 1  1/2 hours hours.  Pt reports that she feels as if she is having palpitations. Pt states I was trying to detox at home but " I couldn't"

## 2014-06-16 NOTE — ED Provider Notes (Signed)
CSN: [id number]     Arrival date & time 06/16/14  2040 History   First MD Initiated Contact with Patient 06/16/14 2210     Chief Complaint  Patient presents with  . Alcohol Intoxication  . Chest Pain     (Consider location/radiation/quality/duration/timing/severity/associated sxs/prior Treatment) HPI Comments: Patient here requesting detox from alcohol. Last drink was just prior to arrival. She admits to having between 4 or 5 shots of vodka. Denies any suicidal or homicidal ideations. Does note increased anxiety which has resulted in some chest tightness. Denies any anginal features to this. Has been in alcohol rehabilitation twice before in the past. Symptoms have been progressively worse over the past few days. She has had emesis 1 which was nonbilious or bloody. Denies any severe abdominal pain.  Patient is a 34 y.o. female presenting with intoxication and chest pain. The history is provided by the patient.  Alcohol Intoxication Associated symptoms include chest pain.  Chest Pain   Past Medical History  Diagnosis Date  . Alcoholic   . Chronic back pain   . Anxiety   . Post traumatic stress disorder (PTSD)    Past Surgical History  Procedure Laterality Date  . Knee surgery Right    No family history on file. History  Substance Use Topics  . Smoking status: Never Smoker   . Smokeless tobacco: Not on file  . Alcohol Use: Yes     Comment: 1 fifth of vadka daily   OB History    No data available     Review of Systems  Cardiovascular: Positive for chest pain.  All other systems reviewed and are negative.     Allergies  Naltrexone and Baclofen  Home Medications   Prior to Admission medications   Medication Sig Start Date End Date Taking? Authorizing Provider  busPIRone (BUSPAR) 10 MG tablet Take 10 mg by mouth 3 (three) times daily.   Yes Historical Provider, MD  busPIRone (BUSPAR) 15 MG tablet Take 15 mg by mouth 3 (three) times daily.   Yes Historical Provider,  MD  norethindrone-ethinyl estradiol (NECON,BREVICON,MODICON) 0.5-35 MG-MCG tablet Take 1 tablet by mouth daily.   Yes Historical Provider, MD  sertraline (ZOLOFT) 100 MG tablet Take 100 mg by mouth daily.   Yes Historical Provider, MD  traZODone (DESYREL) 100 MG tablet Take 100 mg by mouth at bedtime.   Yes Historical Provider, MD  ibuprofen (ADVIL,MOTRIN) 200 MG tablet Take 400-600 mg by mouth every 6 (six) hours as needed for moderate pain.    Historical Provider, MD  LORazepam (ATIVAN) 1 MG tablet Take 1 tablet (1 mg total) by mouth every 8 (eight) hours. Patient not taking: Reported on 06/16/2014 01/02/14   Dorothea OgleIskra M Myers, MD  ondansetron (ZOFRAN) 4 MG tablet Take 1 tablet (4 mg total) by mouth every 6 (six) hours as needed for nausea. Patient not taking: Reported on 06/16/2014 01/02/14   Dorothea OgleIskra M Myers, MD  traMADol (ULTRAM) 50 MG tablet Take 1 tablet (50 mg total) by mouth every 6 (six) hours as needed for moderate pain. Patient not taking: Reported on 06/16/2014 01/02/14   Dorothea OgleIskra M Myers, MD  valACYclovir (VALTREX) 500 MG tablet Take 500 mg by mouth daily as needed (outbreaks).    Historical Provider, MD   BP 135/87 mmHg  Pulse 120  Resp 20  SpO2 98% Physical Exam  Constitutional: She is oriented to person, place, and time. She appears well-developed and well-nourished.  Non-toxic appearance. No distress.  HENT:  Head: Normocephalic  and atraumatic.  Eyes: Conjunctivae, EOM and lids are normal. Pupils are equal, round, and reactive to light.  Neck: Normal range of motion. Neck supple. No tracheal deviation present. No thyroid mass present.  Cardiovascular: Regular rhythm and normal heart sounds.  Tachycardia present.  Exam reveals no gallop.   No murmur heard. Pulmonary/Chest: Effort normal and breath sounds normal. No stridor. No respiratory distress. She has no decreased breath sounds. She has no wheezes. She has no rhonchi. She has no rales.  Abdominal: Soft. Normal appearance and bowel  sounds are normal. She exhibits no distension. There is no tenderness. There is no rebound and no CVA tenderness.  Musculoskeletal: Normal range of motion. She exhibits no edema or tenderness.  Neurological: She is alert and oriented to person, place, and time. She has normal strength. No cranial nerve deficit or sensory deficit. GCS eye subscore is 4. GCS verbal subscore is 5. GCS motor subscore is 6.  Skin: Skin is warm and dry. No abrasion and no rash noted.  Psychiatric: Her speech is normal and behavior is normal. Her affect is blunt. She expresses no suicidal plans and no homicidal plans.  Nursing note and vitals reviewed.   ED Course  Procedures (including critical care time) Labs Review Labs Reviewed  CBC - Abnormal; Notable for the following:    RDW 16.4 (*)    All other components within normal limits  BASIC METABOLIC PANEL - Abnormal; Notable for the following:    Glucose, Bld 133 (*)    Creatinine, Ser 0.41 (*)    Calcium 8.5 (*)    All other components within normal limits  Rosezena SensorI-STAT TROPOININ, ED    Imaging Review Dg Chest Port 1 View  06/16/2014   CLINICAL DATA:  Chest pain.  Alcohol intoxication.  EXAM: PORTABLE CHEST - 1 VIEW  COMPARISON:  None.  FINDINGS: The cardiomediastinal contours are normal. The lungs are clear. Pulmonary vasculature is normal. No consolidation, pleural effusion, or pneumothorax. No acute osseous abnormalities are seen.  IMPRESSION: No acute pulmonary process.   Electronically Signed   By: Rubye OaksMelanie  Ehinger M.D.   On: 06/16/2014 21:32     EKG Interpretation   Date/Time:  Friday Jun 16 2014 20:49:27 EDT Ventricular Rate:  119 PR Interval:  145 QRS Duration: 76 QT Interval:  298 QTC Calculation: 419 R Axis:   75 Text Interpretation:  Sinus tachycardia Borderline T abnormalities,  inferior leads Baseline wander in lead(s) V5 Confirmed by Jolie Strohecker  MD,  Yoshiaki Kreuser ([id number]) on 06/16/2014 10:10:36 PM      MDM   Final diagnoses:  None    Ration  has no resting tremor at this time. She is not hallucinating. She is not going to active alcohol draw. Mild tachycardia noted likely from recent alcohol use. She was given Ativan 1 mg here IM. We given prescription for Librium as well as follow-up instructions. She is in the company of her neighbor who is an alcohol counselor and will be with her tonight. She has no acute psychiatric emergency at this time. Return precautions given    Lorre NickAnthony Olukemi Panchal, MD 06/16/14 2244

## 2014-06-16 NOTE — ED Notes (Signed)
Called patient to triage no answer

## 2014-06-16 NOTE — Discharge Instructions (Signed)
Alcohol Use Disorder °Alcohol use disorder is a mental disorder. It is not a one-time incident of heavy drinking. Alcohol use disorder is the excessive and uncontrollable use of alcohol over time that leads to problems with functioning in one or more areas of daily living. People with this disorder risk harming themselves and others when they drink to excess. Alcohol use disorder also can cause other mental disorders, such as mood and anxiety disorders, and serious physical problems. People with alcohol use disorder often misuse other drugs.  °Alcohol use disorder is common and widespread. Some people with this disorder drink alcohol to cope with or escape from negative life events. Others drink to relieve chronic pain or symptoms of mental illness. People with a family history of alcohol use disorder are at higher risk of losing control and using alcohol to excess.  °SYMPTOMS  °Signs and symptoms of alcohol use disorder may include the following:  °· Consumption of alcohol in larger amounts or over a longer period of time than intended. °· Multiple unsuccessful attempts to cut down or control alcohol use.   °· A great deal of time spent obtaining alcohol, using alcohol, or recovering from the effects of alcohol (hangover). °· A strong desire or urge to use alcohol (cravings).   °· Continued use of alcohol despite problems at work, school, or home because of alcohol use.   °· Continued use of alcohol despite problems in relationships because of alcohol use. °· Continued use of alcohol in situations when it is physically hazardous, such as driving a car. °· Continued use of alcohol despite awareness of a physical or psychological problem that is likely related to alcohol use. Physical problems related to alcohol use can involve the brain, heart, liver, stomach, and intestines. Psychological problems related to alcohol use include intoxication, depression, anxiety, psychosis, delirium, and dementia.   °· The need for  increased amounts of alcohol to achieve the same desired effect, or a decreased effect from the consumption of the same amount of alcohol (tolerance). °· Withdrawal symptoms upon reducing or stopping alcohol use, or alcohol use to reduce or avoid withdrawal symptoms. Withdrawal symptoms include: °¨ Racing heart. °¨ Hand tremor. °¨ Difficulty sleeping. °¨ Nausea. °¨ Vomiting. °¨ Hallucinations. °¨ Restlessness. °¨ Seizures. °DIAGNOSIS °Alcohol use disorder is diagnosed through an assessment by your health care provider. Your health care provider may start by asking three or four questions to screen for excessive or problematic alcohol use. To confirm a diagnosis of alcohol use disorder, at least two symptoms must be present within a 12-month period. The severity of alcohol use disorder depends on the number of symptoms: °· Mild--two or three. °· Moderate--four or five. °· Severe--six or more. °Your health care provider may perform a physical exam or use results from lab tests to see if you have physical problems resulting from alcohol use. Your health care provider may refer you to a mental health professional for evaluation. °TREATMENT  °Some people with alcohol use disorder are able to reduce their alcohol use to low-risk levels. Some people with alcohol use disorder need to quit drinking alcohol. When necessary, mental health professionals with specialized training in substance use treatment can help. Your health care provider can help you decide how severe your alcohol use disorder is and what type of treatment you need. The following forms of treatment are available:  °· Detoxification. Detoxification involves the use of prescription medicines to prevent alcohol withdrawal symptoms in the first week after quitting. This is important for people with a history of symptoms   of withdrawal and for heavy drinkers who are likely to have withdrawal symptoms. Alcohol withdrawal can be dangerous and, in severe cases, cause  death. Detoxification is usually provided in a hospital or in-patient substance use treatment facility. °· Counseling or talk therapy. Talk therapy is provided by substance use treatment counselors. It addresses the reasons people use alcohol and ways to keep them from drinking again. The goals of talk therapy are to help people with alcohol use disorder find healthy activities and ways to cope with life stress, to identify and avoid triggers for alcohol use, and to handle cravings, which can cause relapse. °· Medicines. Different medicines can help treat alcohol use disorder through the following actions: °¨ Decrease alcohol cravings. °¨ Decrease the positive reward response felt from alcohol use. °¨ Produce an uncomfortable physical reaction when alcohol is used (aversion therapy). °· Support groups. Support groups are run by people who have quit drinking. They provide emotional support, advice, and guidance. °These forms of treatment are often combined. Some people with alcohol use disorder benefit from intensive combination treatment provided by specialized substance use treatment centers. Both inpatient and outpatient treatment programs are available. °Document Released: 02/21/2004 Document Revised: 05/30/2013 Document Reviewed: 04/22/2012 °ExitCare® Patient Information ©2015 [employer]. This information is not intended to replace advice given to you by your health care provider. Make sure you discuss any questions you have with your health care provider. °Substance Abuse Treatment Programs ° °Intensive Outpatient Programs °[facility]     °[address]      °[place], [place]                   °[phone]      ° °[facility] °[address] °[place], [place] °[phone] ° °[facility]     °(Inpatient and outpatient)     °[address]           °[phone]   ° °[facility] °336-288-1484 (Suboxone and  Methadone) ° °[address]      °[place], [place] [address]      °336-882-2125      ° °[address] °[place], [place] °852-3033 ° °[facility] (Outpatient/Inpatient, [facility])    °(insurance only) [phone]      °       °[facility]Groups & Residential) °[place], [place] °336-389-1413 ° °   °[facility]     °[address]     °[place], [place]      °[phone]      ° °[facility] (for caregivers and family) °[address] °[place], [place] °336-299-4655 ° ° ° ° ° °Residential Treatment Programs °[facility]      °[address], [place], [place] [address]  °([phone]      ° °[facility] °[address] [place], [place] [address] °[phone] ° °[facility]        °336-248-8914      ° °[facility] °1-800-659-3381 ° °[facility]             °[address]                                         °[place], [place]                                                °  877-615-2722 or 336-784-9470                              ° °[facility] [place] °[address] °[place], [address] °1.877.941.8954 ° °[facility]    °[address]      °[place], [place]     °[phone]      ° °[facility] °[address] °[place], [place] °336-285-9073 ° °[facility]   °[address]     °[place], [place] [address]     °336-899-1550      °Admissions: 8am-3pm M-F ° °[facility] °[address] °, [place] °336-227-7417 ° °[facility]: Residential Program (90 Days)   °[place], [place]      °336-725-8389 or 800-758-6077    ° °[facility] °[place], [place] °(Walk in Hours over the weekend or by referral) ° °[facility] °[address], [place], [place] [address] °(336) [phone] ° °C[facility]is Mobile: [facility]:  1-877-626-1772 (for crisis response 24 hours a day) °[facility] Hotline:       1-800-256-2452 °Outpatient Psychiatry and Counseling ° °[facility]: Mobile Crisis Management 24 hours:  1-877-626-1772 ° °[facility] sliding scale fee and walk in schedule: M-F 8am-12pm/1pm-3pm °[address]  °[place], [place] [address] °[phone] ° °[facility] °[address] °[place], [place] [address] °336-703-9650 ° °[facility] ([facility] known [facility])- new patient walk-in appointments available Monday - Friday 8am -3pm.          °[address] °[place], [place] [address] °336-676-6840 or crisis line- 336-676-6905 ° °[facility] Intensive Outpatient Therapy Program °[address] °[place], [place] [address] °336-832-9804 ° °[facility]                  °Crisis Services      °[phone]      °[address]     °[place], [place] [address]                ° °[facility]   °[facility] °[phone] °[address] °[place], [place] [address] ° ° °[facility]          °[address]  °[place], [place] [address]       °(336) [phone] ° °[facility] °[address] °[place], [place] [address] °336-292-1510 ° °[facility]    °[address]    °[place], [place] [address]     °[phone]      ° °Parish McKinney, MD     °[address]     °[place] [address]     °336-282-1251     °  °[facility] °[address] °[place] [address] ° °[facility]     °[address]     °[place], [place]      °336-542-2076      ° °[facility] °Shamsher Ahluwalia, MD °[address] °[place], [place] [address] °[phone] ° °[facility]     °[address]     °[place], [place] [address]     °336-274-1237      ° °[facility] °[address] °[place],  [address] °336-854-4450 °Resources  for   Temporary Residential Assistance/Crisis Centers ° °DAY CENTERS °[facility] °M-F 8am-3pm   °[address] [place], [place] [address]   336-332-0824 °Services include: laundry, barbering, support groups, case management, phone  & computer access, showers, AA/NA mtgs, mental health/substance abuse nurse, job skills class, disability information, VA assistance, spiritual classes, etc.  ° °HOMELESS SHELTERS ° °[facility]     °[facility]   °[address], [place]     °336.271.5959       °       °[facility] (women and children)       °[address] °[place], [place] [address] °336-275-0820 °[email or web address] for application and process °Application Required ° °[facility]   °[address]    °[place] [address]     °[phone]       °             °[facility] °[address] °[place], [place] [address] °336.273.5572 °336-235-0363(schedule application appt.) °Application Required ° °[facility] (women only)    °[address]     °[place], [place] [address]     °336-884-1039      °Intake starts 6pm daily °Need valid ID, SSC, & Police report °[facility] [place] °[address] °[place], [place] °336-881-5420 °Application Required ° °[facility] (men only)     °[address]      °[place], [place]     °[phone]      ° °[facility] °(Pregnant women only) °[address] °[place], [place] °336-275-0206 ° °[facility]      °[address]      °[place], [place] [address]     °[phone]      °       °[facility] °[address] °[place], [place] °336-723-1848 °90 day commitment/SA/Application process ° °[facility](men only)     °[address]     °[place], [place]     °[phone]       °Check-in at 7pm     °       °[facility] °[address] °[place], [place]  [address] °336-248-6684 °Men/Women/Women and Children must be there by 7 pm ° °[facility] °[place], [place] °336-722-8721                ° °

## 2014-12-12 ENCOUNTER — Emergency Department (HOSPITAL_COMMUNITY)
Admission: EM | Admit: 2014-12-12 | Discharge: 2014-12-12 | Disposition: A | Discharge status: Home / Self Care | Payer: Federal, State, Local not specified - PPO | Attending: Emergency Medicine | Admitting: Emergency Medicine

## 2014-12-12 ENCOUNTER — Encounter (HOSPITAL_COMMUNITY): Payer: Self-pay | Admitting: Emergency Medicine

## 2014-12-12 DIAGNOSIS — F10120 Alcohol abuse with intoxication, uncomplicated: Secondary | ICD-10-CM | POA: Diagnosis not present

## 2014-12-12 DIAGNOSIS — F419 Anxiety disorder, unspecified: Secondary | ICD-10-CM | POA: Insufficient documentation

## 2014-12-12 DIAGNOSIS — G8929 Other chronic pain: Secondary | ICD-10-CM | POA: Insufficient documentation

## 2014-12-12 DIAGNOSIS — Z79899 Other long term (current) drug therapy: Secondary | ICD-10-CM | POA: Diagnosis not present

## 2014-12-12 DIAGNOSIS — F4310 Post-traumatic stress disorder, unspecified: Secondary | ICD-10-CM | POA: Insufficient documentation

## 2014-12-12 DIAGNOSIS — F10129 Alcohol abuse with intoxication, unspecified: Secondary | ICD-10-CM | POA: Diagnosis present

## 2014-12-12 DIAGNOSIS — F10920 Alcohol use, unspecified with intoxication, uncomplicated: Secondary | ICD-10-CM

## 2014-12-12 NOTE — ED Notes (Signed)
Per EMS: pt intoxicated, has had 1/2 bottle of vodka. Pt wants rehab, last rehab 8/25 and started drinking right after that.

## 2014-12-12 NOTE — Discharge Instructions (Signed)
Continue going to your AA meetings.  Try to refrain from drinking.  If you decide you would like to do rehab again, i have attached resources and contact information for you.   Emergency Department Resource Guide 1) Find a Doctor and Pay Out of Pocket Although you won't have to find out who is covered by your insurance plan, it is a good idea to ask around and get recommendations. You will then need to call the office and see if the doctor you have chosen will accept you as a new patient and what types of options they offer for patients who are self-pay. Some doctors offer discounts or will set up payment plans for their patients who do not have insurance, but you will need to ask so you aren't surprised when you get to your appointment.  2) Contact Your Local Health Department Not all health departments have doctors that can see patients for sick visits, but many do, so it is worth a call to see if yours does. If you don't know where your local health department is, you can check in your phone book. The CDC also has a tool to help you locate your state's health department, and many state websites also have listings of all of their local health departments.  3) Find a Walk-in Clinic If your illness is not likely to be very severe or complicated, you may want to try a walk in clinic. These are popping up all over the country in pharmacies, drugstores, and shopping centers. They're usually staffed by nurse practitioners or physician assistants that have been trained to treat common illnesses and complaints. They're usually fairly quick and inexpensive. However, if you have serious medical issues or chronic medical problems, these are probably not your best option.  No Primary Care Doctor: - Call Health Connect at  [phone] - they can help you locate a primary care doctor that  accepts your insurance, provides certain services, etc. - Physician Referral Service- [phone]  Chronic Pain  Problems: Organization         Address  Phone   Notes  [facility]  [phone] Patients need to be referred by their primary care doctor.   Medication Assistance: Organization         Address  Phone   Notes  [facility] [address] [place], [place] [address] [phone] --Must be a resident of [place] [facility] -- Must have NO insurance coverage whatsoever (no Medicaid/ Medicare, etc.) -- The pt. MUST have a primary care doctor that directs their care regularly and follows them in the community   MedAssist  [phone]   [employer]  [phone]    Agencies that provide inexpensive medical care: Organization         Address  Phone   Notes  [facility]  [phone]   [facility]    [phone]   [facility] [address] [place], [place] [address] [phone]   [facility] [address], [place] [phone]   [facility]anned [facility]    [phone]   [facility]    [phone]   [facility]  [address], [place] Phone:  [phone], Fax:  [phone] Hours of Operation:  9 am - 6 pm, M-F.  Also accepts Medicaid/Medicare and self-pay.  [facility]  [address], [place] Phone: [phone], Fax: [phone]. Hours of Operation:  8:30 am - 5:30 pm, M-F.  Also accepts Medicaid and self-pay.  °[facility] [place] [address], [place] Phone: [phone]   °[facility] [address], [place], [place] [phone], Ext. [phone] Mondays & Thursdays: 7-9 AM.  First 15 patients are seen on a first come, first serve basis. °  ° °Medicaid-accepting [place] Providers: ° °Organization         Address  Phone   Notes  °[facility] [address], [place] [phone] Also  accepts self-pay patients.  °[facility] [address], [place] ° [phone]   °[facility] [address], [place] [phone]   °[facility] [address] [place] [address], [place] [phone]   °Veita Bland [address], [place]  ° [phone] Only accepts [place] Access Medicaid patients after they have their name applied to their card.  ° °Self-Pay (no insurance) in [place]: ° °Organization         Address  Phone   Notes  °Sickle Cell Patients, [facility] [address], [place] [phone]   °[facility] [address], [place] [phone]   °[facility] [place] ° [address], [place] [phone]   °[facility]/Dr. Osei-Bonsu ° [address] [place] [address], [place] or [address], [place] [phone] Phone number for both [place] and [place] locations is the same.  °[facility] [address], [place] [phone]   °[facility] [address] [place] [address], [place] or [address] [phone] °([phone]) [phone]   °[facility] [address], [place] [phone], phone; [phone], fax Sees patients 1st and 3rd Saturday of every month.  Must not qualify for public or private insurance (i.e. Medicaid, Medicare, [place] Health Choice, Veterans' Benefits) • Household income should be no more than 200% of the poverty level •The clinic cannot treat you if you are pregnant or think you are pregnant • Sexually transmitted diseases are not treated at the clinic.  ° ° °Dental Care: °Organization         Address  Phone  Notes  °[place] [facility] [address], [place] [phone] Accepts children up to age 21 who are enrolled in Medicaid or [place] Health Choice; pregnant  women with a Medicaid card; and children who have applied for Medicaid or [place] Health Choice, but were declined, whose parents can pay a reduced fee at time of service.  °[place] [facility] [place]  [address], [place] [phone]) [phone] Accepts children up to age 21 who are enrolled in Medicaid or [place] Health Choice; pregnant women with a Medicaid card; and children who have applied for Medicaid or  Health Choice, but were declined, whose parents can pay a reduced fee at time of service.  °[facility] ° [address],  [phone] Patients are seen by appointment only. Walk-ins are not accepted. [facility] will see patients 18 years of age and older. °Monday - Tuesday (8am-5pm) °Most Wednesdays (8:30-5pm) °$30 per visit, cash only  °[facility]  [address], [facility] [phone] Patients are seen by appointment only. Walk-ins are not accepted. [place] will see patients 60 years of age and older. One Wednesday Evening (Monthly: Volunteer Based).  $30 per visit, cash only  [place]  [phone] for adults; Children under age 4, call Graduate Pediatric Dentistry at [phone]. Children aged 49-14, please call [phone] to request a pediatric application.  Dental services are provided in all areas of dental care including fillings, crowns and bridges, complete and partial dentures, implants, gum treatment, root canals, and extractions. Preventive care is also provided. Treatment is provided to both adults and children. Patients are selected via a lottery and there is often a waiting list.   [facility] [address] [place]  [phone] [email or web address]   [facility] [address] [place], [place] [phone], Ext. [phone] Second and Fourth Thursday of each month, opens at 6:30 AM; Clinic ends at 9 AM.  Patients are  seen on a first-come first-served basis, and a limited number are seen during each clinic.   [facility]  [address] Hillard Danker [place], [place] [phone]   Eligibility Requirements You must have lived in [place], [place], or [place] counties for at least the last three months.   You cannot be eligible for state or federal sponsored [employer], including [employer], [place], or [employer].   You generally cannot be eligible for healthcare insurance through your employer.    How to apply: Eligibility screenings are held every Tuesday and Wednesday afternoon from 1:00 pm until 4:00 pm. You do not need an appointment for the interview!  [facility] [address] [place], [place]   [place] Department  [place]  [phone]    Behavioral Health Resources in the Community: Intensive Outpatient Programs Organization         Address  Phone  Notes  [facility] [place]. [address], [place], [place] [phone]   [facility] [address], [place], [place]   [facility]: [facility] [address] [place] [address], [place], [place]   [place] [address],  [place], [place] or [phone]   Substance Abuse Resources Organization         Address  Phone  Notes  [facility] Drug Services  [phone]   [place]  [phone]   [facility] [place]   Chinita Pester  [phone]   Residential & Outpatient Substance Abuse Program  [phone]   Psychological Services Organization         Address  Phone  Notes  [facility] [place]  [place]  [phone]   [place] [address] [place] [phone] or [phone]    Mobile Crisis  Teams Organization         Address  Phone  Notes  [facility], Mobile Crisis Care Unit  [phone]   [facility]  [address] [place], [place]   [facility] [address] [place] [phone]    Self-Help/Support Groups Organization         Address  Phone             Notes  [facility]  [facility] [place] - variety of support groups  [place] Call for more information  Narcotics Anonymous (NA), [facility] [address], [employer] [place]  2 meetings at this location   Special educational needs teacher         Address  Phone  Notes  [facility] [place],    [place]  [phone]   [facility]  [address], [place] [address], [place], [place]   [place], [place] [phone] Admissions: 8am-3pm M-F  Incentives Substance [place] [address],    [place], [place] [phone]   [facility] [address] [place], [place], [place]   [facility] [address]  [place], [place]   [facility] - Intensive Outpatient [address], [place], [place]   [facility] [place]  [place], [place] [phone] or [phone]   [facility] ([facility]) [address] [place], [place] Accepts Medicaid  [facility] [place] [address],  [place] [phone] Substance Abuse/Addiction Treatment   [facility] Organization         Address  Phone  Notes  [facility]  [phone]   Domenic Schwab, PhD [address] Arlis Port[place], [place]   [phone] or [phone]   [place], [place] [phone]   [facility] [address], [place], [place] [phone]  Insurance/Medicaid/sponsorship through [facility] [address] [place] [address]                                    [place], [place] [phone] [place] [address], [place] [phone]    Dr. Adele Schilder  [phone]   [facility] Dept. 1) [address] [place] 2) [place] 3)  [address] [address], [place] [phone]  [phone]   [place] [phone] or [phone] (After Hours)

## 2014-12-12 NOTE — ED Provider Notes (Signed)
CSN: [id number]     Arrival date & time 12/12/14  0754 History   First MD Initiated Contact with Patient 12/12/14 [phone]     Chief Complaint  Patient presents with  . Alcohol Intoxication     (Consider location/radiation/quality/duration/timing/severity/associated sxs/prior Treatment) Patient is a 34 y.o. female presenting with intoxication. The history is provided by the patient and medical records.  Alcohol Intoxication  LEVEL V CAVEAT:  ALCOHOL INTOXICATION 34 y.o. F with hx of alcoholism, chronic back pain, anxiety, PTSD, presenting to the ED acutely intoxicated.  Patient reportedly drank 1/2 a bottle of vodka last night.  Friends that patient was drinking with was also in the ED last night for similar symptoms.  Patient states she feels nauseated, not currently vomiting.  Patient did complete rehab on 09/21/14 but states she started drinking again the next day after she got out.  Patient states she does not want rehab again at this time.  Past Medical History  Diagnosis Date  . Alcoholic (HCC)   . Chronic back pain   . Anxiety   . Post traumatic stress disorder (PTSD)    Past Surgical History  Procedure Laterality Date  . Knee surgery Right    History reviewed. No pertinent family history. Social History  Substance Use Topics  . Smoking status: Never Smoker   . Smokeless tobacco: None  . Alcohol Use: Yes     Comment: 1 fifth of vadka daily   OB History    No data available     Review of Systems  Unable to perform ROS: Other (alcohol intoxication)      Allergies  Naltrexone and Baclofen  Home Medications   Prior to Admission medications   Medication Sig Start Date End Date Taking? Authorizing Provider  busPIRone (BUSPAR) 10 MG tablet Take 10 mg by mouth 3 (three) times daily.    Historical Provider, MD  busPIRone (BUSPAR) 15 MG tablet Take 15 mg by mouth 3 (three) times daily.    Historical Provider, MD  chlordiazePOXIDE (LIBRIUM) 25 MG capsule  PO TID x  1D, then 25-50mg  PO BID X 1D, then 25-50mg  PO QD X 1D 06/16/14   Lorre Nick, MD  ibuprofen (ADVIL,MOTRIN) 200 MG tablet Take 400-600 mg by mouth every 6 (six) hours as needed for moderate pain.    Historical Provider, MD  LORazepam (ATIVAN) 1 MG tablet Take 1 tablet (1 mg total) by mouth every 8 (eight) hours. Patient not taking: Reported on 06/16/2014 01/02/14   Dorothea Ogle, MD  norethindrone-ethinyl estradiol (NECON,BREVICON,MODICON) 0.5-35 MG-MCG tablet Take 1 tablet by mouth daily.    Historical Provider, MD  ondansetron (ZOFRAN) 4 MG tablet Take 1 tablet (4 mg total) by mouth every 6 (six) hours as needed for nausea. Patient not taking: Reported on 06/16/2014 01/02/14   Dorothea Ogle, MD  sertraline (ZOLOFT) 100 MG tablet Take 100 mg by mouth daily.    Historical Provider, MD  traMADol (ULTRAM) 50 MG tablet Take 1 tablet (50 mg total) by mouth every 6 (six) hours as needed for moderate pain. Patient not taking: Reported on 06/16/2014 01/02/14   Dorothea Ogle, MD  traZODone (DESYREL) 100 MG tablet Take 100 mg by mouth at bedtime.    Historical Provider, MD  valACYclovir (VALTREX) 500 MG tablet Take 500 mg by mouth daily as needed (outbreaks).    Historical Provider, MD   BP 124/81 mmHg  Pulse 112  Temp(Src) 98.5 F (36.9 C) (Oral)  Resp 16  SpO2  96%   Physical Exam  Constitutional: She appears well-developed and well-nourished. No distress.  Appears intoxicated, arousable to verbal stimuli  HENT:  Head: Normocephalic and atraumatic.  Mouth/Throat: Oropharynx is clear and moist.  No signs of head or facial trauma  Eyes: Conjunctivae and EOM are normal. Pupils are equal, round, and reactive to light.  Neck: Normal range of motion. Neck supple.  Cardiovascular: Normal rate, regular rhythm and normal heart sounds.   Pulmonary/Chest: Effort normal and breath sounds normal. No respiratory distress. She has no wheezes.  Abdominal: Soft. Bowel sounds are normal. There is no tenderness. There  is no guarding.  Musculoskeletal: Normal range of motion. She exhibits no edema.  Neurological:  Sleeping but arousable to verbal and tactili stimuli; able to move her extremities when prompted; no tremors or seizure activity  Skin: Skin is warm and dry. She is not diaphoretic.  Psychiatric: She has a normal mood and affect.  Nursing note and vitals reviewed.   ED Course  Procedures (including critical care time) Labs Review Labs Reviewed - No data to display  Imaging Review No results found. I have personally reviewed and evaluated these images and lab results as part of my medical decision-making.   EKG Interpretation None      MDM   Final diagnoses:  Alcohol intoxication, uncomplicated (HCC)   34 y.o. F here acutely intoxicated.  Her exam is atraumatic.  She is arousable to verbal and tactile stimuli.  She is moving all 4 extremities well.  Pupils are dilated but reactive.  Patient admits to heavy alcohol use but denies drug use.  No active vomiting, tremors, or seizure activity currently.  Appears to be uncomplicated alcohol intoxication.  She states she does not have any family/friends to pick her up at this time.  Will continue to monitor and allow to sober.  VSS.  10:04 AM Patient continues sleeping, NAD.  VS remain stable.  Will continue to monitor.  1300-- patient awake, alert, and fully oriented.  She is sitting upright and talking with friend in room.  She has eaten full meal and fluids without difficulty.  Remains without tremors or seizure activity.  VSS. Patient stable for discharge.  I have given her resources for outpatient rehab if she changes her mind about undergoing detox once again.  She states she will follow-up with her AA group.  Discussed plan with patient, he/she acknowledged understanding and agreed with plan of care.  Return precautions given for new or worsening symptoms.  Garlon HatchetLisa M Donell Tomkins, PA-C 12/12/14 1323  Cathren LaineKevin Steinl, MD 12/13/14 [phone]

## 2014-12-12 NOTE — ED Notes (Signed)
Bed: WA04 Expected date:  Expected time:  Means of arrival:  Comments: EMS 39F ETOH

## 2014-12-13 ENCOUNTER — Emergency Department (HOSPITAL_COMMUNITY)
Admission: EM | Admit: 2014-12-13 | Discharge: 2014-12-14 | Disposition: A | Discharge status: Other Acute Inpatient Hospital | Payer: Federal, State, Local not specified - PPO | Attending: Emergency Medicine | Admitting: Emergency Medicine

## 2014-12-13 ENCOUNTER — Emergency Department (HOSPITAL_COMMUNITY): Payer: Federal, State, Local not specified - PPO

## 2014-12-13 ENCOUNTER — Encounter (HOSPITAL_COMMUNITY): Payer: Self-pay

## 2014-12-13 DIAGNOSIS — G8929 Other chronic pain: Secondary | ICD-10-CM | POA: Diagnosis not present

## 2014-12-13 DIAGNOSIS — S51811A Laceration without foreign body of right forearm, initial encounter: Secondary | ICD-10-CM | POA: Diagnosis present

## 2014-12-13 DIAGNOSIS — Y9389 Activity, other specified: Secondary | ICD-10-CM | POA: Diagnosis not present

## 2014-12-13 DIAGNOSIS — S0083XA Contusion of other part of head, initial encounter: Secondary | ICD-10-CM | POA: Diagnosis not present

## 2014-12-13 DIAGNOSIS — F10920 Alcohol use, unspecified with intoxication, uncomplicated: Secondary | ICD-10-CM

## 2014-12-13 DIAGNOSIS — Y998 Other external cause status: Secondary | ICD-10-CM | POA: Diagnosis not present

## 2014-12-13 DIAGNOSIS — Y92009 Unspecified place in unspecified non-institutional (private) residence as the place of occurrence of the external cause: Secondary | ICD-10-CM | POA: Diagnosis not present

## 2014-12-13 DIAGNOSIS — W01198A Fall on same level from slipping, tripping and stumbling with subsequent striking against other object, initial encounter: Secondary | ICD-10-CM | POA: Diagnosis not present

## 2014-12-13 DIAGNOSIS — IMO0002 Reserved for concepts with insufficient information to code with codable children: Secondary | ICD-10-CM

## 2014-12-13 DIAGNOSIS — F419 Anxiety disorder, unspecified: Secondary | ICD-10-CM | POA: Diagnosis not present

## 2014-12-13 DIAGNOSIS — F1020 Alcohol dependence, uncomplicated: Secondary | ICD-10-CM | POA: Diagnosis not present

## 2014-12-13 DIAGNOSIS — F10239 Alcohol dependence with withdrawal, unspecified: Secondary | ICD-10-CM | POA: Diagnosis present

## 2014-12-13 DIAGNOSIS — Z79899 Other long term (current) drug therapy: Secondary | ICD-10-CM | POA: Diagnosis not present

## 2014-12-13 DIAGNOSIS — Z793 Long term (current) use of hormonal contraceptives: Secondary | ICD-10-CM | POA: Diagnosis not present

## 2014-12-13 DIAGNOSIS — F10120 Alcohol abuse with intoxication, uncomplicated: Secondary | ICD-10-CM | POA: Insufficient documentation

## 2014-12-13 DIAGNOSIS — F10939 Alcohol use, unspecified with withdrawal, unspecified: Secondary | ICD-10-CM | POA: Diagnosis present

## 2014-12-13 DIAGNOSIS — Z3202 Encounter for pregnancy test, result negative: Secondary | ICD-10-CM | POA: Diagnosis not present

## 2014-12-13 LAB — BASIC METABOLIC PANEL
Anion gap: 15 (ref 5–15)
BUN: 11 mg/dL (ref 6–20)
CALCIUM: 8 mg/dL — AB (ref 8.9–10.3)
CO2: 24 mmol/L (ref 22–32)
CREATININE: 0.55 mg/dL (ref 0.44–1.00)
Chloride: 104 mmol/L (ref 101–111)
Glucose, Bld: 100 mg/dL — ABNORMAL HIGH (ref 65–99)
Potassium: 3.7 mmol/L (ref 3.5–5.1)
SODIUM: 143 mmol/L (ref 135–145)

## 2014-12-13 LAB — CBC WITH DIFFERENTIAL/PLATELET
BASOS PCT: 1 %
Basophils Absolute: 0.1 10*3/uL (ref 0.0–0.1)
EOS ABS: 0 10*3/uL (ref 0.0–0.7)
Eosinophils Relative: 0 %
HCT: 39.2 % (ref 36.0–46.0)
HEMOGLOBIN: 13.2 g/dL (ref 12.0–15.0)
Lymphocytes Relative: 29 %
Lymphs Abs: 1.4 10*3/uL (ref 0.7–4.0)
MCH: 30.6 pg (ref 26.0–34.0)
MCHC: 33.7 g/dL (ref 30.0–36.0)
MCV: 91 fL (ref 78.0–100.0)
MONOS PCT: 6 %
Monocytes Absolute: 0.3 10*3/uL (ref 0.1–1.0)
NEUTROS PCT: 64 %
Neutro Abs: 3.1 10*3/uL (ref 1.7–7.7)
Platelets: 163 10*3/uL (ref 150–400)
RBC: 4.31 MIL/uL (ref 3.87–5.11)
RDW: 14 % (ref 11.5–15.5)
WBC: 4.9 10*3/uL (ref 4.0–10.5)

## 2014-12-13 LAB — RAPID URINE DRUG SCREEN, HOSP PERFORMED
Amphetamines: NOT DETECTED
Barbiturates: NOT DETECTED
Benzodiazepines: NOT DETECTED
Cocaine: NOT DETECTED
OPIATES: NOT DETECTED
Tetrahydrocannabinol: NOT DETECTED

## 2014-12-13 LAB — SALICYLATE LEVEL

## 2014-12-13 LAB — ETHANOL: ALCOHOL ETHYL (B): 416 mg/dL — AB (ref <5)

## 2014-12-13 LAB — ACETAMINOPHEN LEVEL

## 2014-12-13 LAB — PREGNANCY, URINE: Preg Test, Ur: NEGATIVE

## 2014-12-13 MED ORDER — LURASIDONE HCL 40 MG PO TABS
40.0000 mg | ORAL_TABLET | Freq: Every day | ORAL | Status: DC
  Administered 2014-12-14: 40 mg via ORAL
  Filled 2014-12-13 (×3): qty 1

## 2014-12-13 MED ORDER — LOPERAMIDE HCL 2 MG PO CAPS: 2.0000 mg | ORAL_CAPSULE | ORAL | Status: DC | PRN

## 2014-12-13 MED ORDER — VILAZODONE HCL 20 MG PO TABS
20.0000 mg | ORAL_TABLET | Freq: Every day | ORAL | Status: DC
  Administered 2014-12-13 – 2014-12-14 (×2): 20 mg via ORAL
  Filled 2014-12-13 (×2): qty 1

## 2014-12-13 MED ORDER — CHLORDIAZEPOXIDE HCL 25 MG PO CAPS: 25.0000 mg | ORAL_CAPSULE | Freq: Every day | ORAL | Status: DC

## 2014-12-13 MED ORDER — ACETAMINOPHEN 325 MG PO TABS: 650.0000 mg | ORAL_TABLET | ORAL | Status: DC | PRN

## 2014-12-13 MED ORDER — HYDROXYZINE HCL 25 MG PO TABS
25.0000 mg | ORAL_TABLET | Freq: Four times a day (QID) | ORAL | Status: DC | PRN
  Administered 2014-12-13 – 2014-12-14 (×3): 25 mg via ORAL
  Filled 2014-12-13 (×3): qty 1

## 2014-12-13 MED ORDER — CHLORDIAZEPOXIDE HCL 25 MG PO CAPS: 25.0000 mg | ORAL_CAPSULE | ORAL | Status: DC

## 2014-12-13 MED ORDER — THIAMINE HCL 100 MG/ML IJ SOLN: 100.0000 mg | Freq: Once | INTRAMUSCULAR | Status: DC

## 2014-12-13 MED ORDER — LORAZEPAM 1 MG PO TABS
2.0000 mg | ORAL_TABLET | Freq: Once | ORAL | Status: AC
  Administered 2014-12-13: 2 mg via ORAL
  Filled 2014-12-13: qty 2

## 2014-12-13 MED ORDER — TETANUS-DIPHTH-ACELL PERTUSSIS 5-2.5-18.5 LF-MCG/0.5 IM SUSP
0.5000 mL | Freq: Once | INTRAMUSCULAR | Status: AC
  Administered 2014-12-13: 0.5 mL via INTRAMUSCULAR

## 2014-12-13 MED ORDER — VALACYCLOVIR HCL 500 MG PO TABS
500.0000 mg | ORAL_TABLET | Freq: Every day | ORAL | Status: DC | PRN
  Filled 2014-12-13: qty 1

## 2014-12-13 MED ORDER — CHLORDIAZEPOXIDE HCL 25 MG PO CAPS
25.0000 mg | ORAL_CAPSULE | Freq: Three times a day (TID) | ORAL | Status: DC
  Administered 2014-12-14: 25 mg via ORAL
  Filled 2014-12-13: qty 1

## 2014-12-13 MED ORDER — ONDANSETRON 4 MG PO TBDP
4.0000 mg | ORAL_TABLET | Freq: Four times a day (QID) | ORAL | Status: DC | PRN
  Administered 2014-12-13 (×2): 4 mg via ORAL
  Filled 2014-12-13 (×3): qty 1

## 2014-12-13 MED ORDER — IBUPROFEN 200 MG PO TABS
600.0000 mg | ORAL_TABLET | Freq: Three times a day (TID) | ORAL | Status: DC | PRN
  Administered 2014-12-13 – 2014-12-14 (×2): 600 mg via ORAL
  Filled 2014-12-13 (×2): qty 3

## 2014-12-13 MED ORDER — TRAZODONE HCL 100 MG PO TABS: 100.0000 mg | ORAL_TABLET | Freq: Every evening | ORAL | Status: DC | PRN

## 2014-12-13 MED ORDER — LIDOCAINE-EPINEPHRINE 2 %-1:100000 IJ SOLN
20.0000 mL | Freq: Once | INTRAMUSCULAR | Status: AC
  Administered 2014-12-13: 5 mL

## 2014-12-13 MED ORDER — CHLORDIAZEPOXIDE HCL 25 MG PO CAPS
25.0000 mg | ORAL_CAPSULE | Freq: Four times a day (QID) | ORAL | Status: DC | PRN
  Administered 2014-12-14: 25 mg via ORAL
  Filled 2014-12-13: qty 1

## 2014-12-13 MED ORDER — PROMETHAZINE HCL 25 MG RE SUPP
25.0000 mg | Freq: Four times a day (QID) | RECTAL | Status: DC | PRN
  Administered 2014-12-13 – 2014-12-14 (×2): 25 mg via RECTAL
  Filled 2014-12-13 (×3): qty 1

## 2014-12-13 MED ORDER — VITAMIN B-1 100 MG PO TABS
100.0000 mg | ORAL_TABLET | Freq: Every day | ORAL | Status: DC
  Administered 2014-12-13 – 2014-12-14 (×2): 100 mg via ORAL
  Filled 2014-12-13 (×2): qty 1

## 2014-12-13 MED ORDER — ADULT MULTIVITAMIN W/MINERALS CH
1.0000 | ORAL_TABLET | Freq: Every day | ORAL | Status: DC
  Administered 2014-12-13 – 2014-12-14 (×2): 1 via ORAL
  Filled 2014-12-13 (×2): qty 1

## 2014-12-13 MED ORDER — CLINDAMYCIN HCL 150 MG PO CAPS: 300.0000 mg | ORAL_CAPSULE | Freq: Four times a day (QID) | ORAL | Status: DC

## 2014-12-13 MED ORDER — CHLORDIAZEPOXIDE HCL 25 MG PO CAPS
ORAL_CAPSULE | ORAL | Status: DC
Start: 2014-12-13 — End: 2014-12-17

## 2014-12-13 MED ORDER — CHLORDIAZEPOXIDE HCL 25 MG PO CAPS
25.0000 mg | ORAL_CAPSULE | Freq: Four times a day (QID) | ORAL | Status: DC
  Administered 2014-12-13 (×4): 25 mg via ORAL
  Filled 2014-12-13 (×4): qty 1

## 2014-12-13 NOTE — ED Notes (Addendum)
[facility].  Pt fell forward into toilet, breaking toilet, causing right forearm laceration.  Pt also has forehead contusion. Unknown if loss of consciousness.
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
## 2014-12-13 NOTE — ED Notes (Signed)
Patient continues to have multiple episodes of vomiting.  EDP contacted.
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
## 2014-12-13 NOTE — ED Notes (Signed)
Bed: [id number] Expected date:  Expected time:  Means of arrival:  Comments: EMS Laceration arm laceration + LOC after fall

## 2014-12-13 NOTE — ED Notes (Signed)
Informed [facility] charge RN of pt status.  Will update after 1800 vs.

## 2014-12-13 NOTE — ED Notes (Signed)
Notified Dr Corlis LeakMackuen in reference to 2 consecutive CIWA's over 10.  Ativan 2 mgs ordered.

## 2014-12-13 NOTE — BH Assessment (Addendum)
[facility] Assessment Progress Note  Per Thedore MinsMojeed Akintayo, MD, this pt requires psychiatric hospitalization at this time.  Berneice Heinrichina Tate, RN, [id number]  Pt has signed Voluntary Admission and Consent for Treatment, as well as Consent to Release Information to her parents and to [facility], her outpatient provider, and a notification call has been placed.  [facility]  Pt's nurse, Carlisle BeersLuann, has been notified, and agrees to send original paperwork along with pt via [facility] [place], and to call report to [phone].  Doylene Canninghomas Raetta Agostinelli, MA Triage Specialist [phone]

## 2014-12-13 NOTE — ED Notes (Signed)
Suture cart at bedside 

## 2014-12-13 NOTE — ED Notes (Signed)
Report given to Luann,RN.

## 2014-12-13 NOTE — ED Notes (Signed)
[facility] RN Brandon, [facility].  RN to spPort Royaleak with [facility] in [facility] to CIWA score and return call.
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
## 2014-12-13 NOTE — ED Notes (Signed)
Pt resting at present, no vomiting noted at present.

## 2014-12-13 NOTE — ED Notes (Signed)
Pt transported to xray 

## 2014-12-13 NOTE — Discharge Instructions (Signed)
Go to your doctor or to urgent care in 10 days to have sutures removed.  Alcohol Intoxication Alcohol intoxication occurs when the amount of alcohol that a person has consumed impairs his or her ability to mentally and physically function. Alcohol directly impairs the normal chemical activity of the brain. Drinking large amounts of alcohol can lead to changes in mental function and behavior, and it can cause many physical effects that can be harmful.  Alcohol intoxication can range in severity from mild to very severe. Various factors can affect the level of intoxication that occurs, such as the person's age, gender, weight, frequency of alcohol consumption, and the presence of other medical conditions (such as diabetes, seizures, or heart conditions). Dangerous levels of alcohol intoxication may occur when people drink large amounts of alcohol in a short period (binge drinking). Alcohol can also be especially dangerous when combined with certain prescription medicines or "recreational" drugs. SIGNS AND SYMPTOMS Some common signs and symptoms of mild alcohol intoxication include:  Loss of coordination.  Changes in mood and behavior.  Impaired judgment.  Slurred speech. As alcohol intoxication progresses to more severe levels, other signs and symptoms will appear. These may include:  Vomiting.  Confusion and impaired memory.  Slowed breathing.  Seizures.  Loss of consciousness. DIAGNOSIS  Your health care provider will take a medical history and perform a physical exam. You will be asked about the amount and type of alcohol you have consumed. Blood tests will be done to measure the concentration of alcohol in your blood. In many places, your blood alcohol level must be lower than 80 mg/dL (4.09%) to legally drive. However, many dangerous effects of alcohol can occur at much lower levels.  TREATMENT  People with alcohol intoxication often do not require treatment. Most of the effects of  alcohol intoxication are temporary, and they go away as the alcohol naturally leaves the body. Your health care provider will monitor your condition until you are stable enough to go home. Fluids are sometimes given through an IV access tube to help prevent dehydration.  HOME CARE INSTRUCTIONS  Do not drive after drinking alcohol.  Stay hydrated. Drink enough water and fluids to keep your urine clear or pale yellow. Avoid caffeine.   Only take over-the-counter or prescription medicines as directed by your health care provider.  SEEK MEDICAL CARE IF:   You have persistent vomiting.   You do not feel better after a few days.  You have frequent alcohol intoxication. Your health care provider can help determine if you should see a substance use treatment counselor. SEEK IMMEDIATE MEDICAL CARE IF:   You become shaky or tremble when you try to stop drinking.   You shake uncontrollably (seizure).   You throw up (vomit) blood. This may be bright red or may look like black coffee grounds.   You have blood in your stool. This may be bright red or may appear as a black, tarry, bad smelling stool.   You become lightheaded or faint.  MAKE SURE YOU:   Understand these instructions.  Will watch your condition.  Will get help right away if you are not doing well or get worse.   This information is not intended to replace advice given to you by your health care provider. Make sure you discuss any questions you have with your health care provider.   Document Released: 10/23/2004 Document Revised: 09/15/2012 Document Reviewed: 06/18/2012 Elsevier Interactive Patient Education 2016 Elsevier Inc. Laceration Care, Adult A laceration is  a cut that goes through all of the layers of the skin and into the tissue that is right under the skin. Some lacerations heal on their own. Others need to be closed with stitches (sutures), staples, skin adhesive strips, or skin glue. Proper laceration care  minimizes the risk of infection and helps the laceration to heal better. HOW TO CARE FOR YOUR LACERATION If sutures or staples were used:  Keep the wound clean and dry.  If you were given a bandage (dressing), you should change it at least one time per day or as told by your health care provider. You should also change it if it becomes wet or dirty.  Keep the wound completely dry for the first 24 hours or as told by your health care provider. After that time, you may shower or bathe. However, make sure that the wound is not soaked in water until after the sutures or staples have been removed.  Clean the wound one time each day or as told by your health care provider:  Wash the wound with soap and water.  Rinse the wound with water to remove all soap.  Pat the wound dry with a clean towel. Do not rub the wound.  After cleaning the wound, apply a thin layer of antibiotic ointmentas told by your health care provider. This will help to prevent infection and keep the dressing from sticking to the wound.  Have the sutures or staples removed as told by your health care provider. If skin adhesive strips were used:  Keep the wound clean and dry.  If you were given a bandage (dressing), you should change it at least one time per day or as told by your health care provider. You should also change it if it becomes dirty or wet.  Do not get the skin adhesive strips wet. You may shower or bathe, but be careful to keep the wound dry.  If the wound gets wet, pat it dry with a clean towel. Do not rub the wound.  Skin adhesive strips fall off on their own. You may trim the strips as the wound heals. Do not remove skin adhesive strips that are still stuck to the wound. They will fall off in time. If skin glue was used:  Try to keep the wound dry, but you may briefly wet it in the shower or bath. Do not soak the wound in water, such as by swimming.  After you have showered or bathed, gently pat the  wound dry with a clean towel. Do not rub the wound.  Do not do any activities that will make you sweat heavily until the skin glue has fallen off on its own.  Do not apply liquid, cream, or ointment medicine to the wound while the skin glue is in place. Using those may loosen the film before the wound has healed.  If you were given a bandage (dressing), you should change it at least one time per day or as told by your health care provider. You should also change it if it becomes dirty or wet.  If a dressing is placed over the wound, be careful not to apply tape directly over the skin glue. Doing that may cause the glue to be pulled off before the wound has healed.  Do not pick at the glue. The skin glue usually remains in place for 5-10 days, then it falls off of the skin. General Instructions  Take over-the-counter and prescription medicines only as told by  your health care provider.  If you were prescribed an antibiotic medicine or ointment, take or apply it as told by your doctor. Do not stop using it even if your condition improves.  To help prevent scarring, make sure to cover your wound with sunscreen whenever you are outside after stitches are removed, after adhesive strips are removed, or when glue remains in place and the wound is healed. Make sure to wear a sunscreen of at least 30 SPF.  Do not scratch or pick at the wound.  Keep all follow-up visits as told by your health care provider. This is important.  Check your wound every day for signs of infection. Watch for:  Redness, swelling, or pain.  Fluid, blood, or pus.  Raise (elevate) the injured area above the level of your heart while you are sitting or lying down, if possible. SEEK MEDICAL CARE IF:  You received a tetanus shot and you have swelling, severe pain, redness, or bleeding at the injection site.  You have a fever.  A wound that was closed breaks open.  You notice a bad smell coming from your wound or your  dressing.  You notice something coming out of the wound, such as wood or glass.  Your pain is not controlled with medicine.  You have increased redness, swelling, or pain at the site of your wound.  You have fluid, blood, or pus coming from your wound.  You notice a change in the color of your skin near your wound.  You need to change the dressing frequently due to fluid, blood, or pus draining from the wound.  You develop a new rash.  You develop numbness around the wound. SEEK IMMEDIATE MEDICAL CARE IF:  You develop severe swelling around the wound.  Your pain suddenly increases and is severe.  You develop painful lumps near the wound or on skin that is anywhere on your body.  You have a red streak going away from your wound.  The wound is on your hand or foot and you cannot properly move a finger or toe.  The wound is on your hand or foot and you notice that your fingers or toes look pale or bluish.   This information is not intended to replace advice given to you by your health care provider. Make sure you discuss any questions you have with your health care provider.   Document Released: 01/13/2005 Document Revised: 05/30/2014 Document Reviewed: 01/09/2014 Elsevier Interactive Patient Education 2016 [employer] Substance Abuse Treatment Programs  Intensive Outpatient Programs [facility]     [address]      [place], [place]                   [phone]       [facility] [address] [place], [place] [phone]  [facility]     (Inpatient and outpatient)     [address]           [phone]    [facility] [phone] (Suboxone and Methadone)  [address]      [place], [place] [id number]      [phone]       [address] [place], [place] [phone]  [facility] (Outpatient/Inpatient, [facility])    (insurance only)  [phone]             [facility] (Groups & Residential) [place], [place] [phone]  [facility]     [address]     [place], [place]      [phone]       [facility] (for caregivers and family) [phone] Laurell Josephs[address] [place], [place] [phone]      Residential Treatment Programs [facility]      [address], [place], [place] [address]  [phone]       [facility] [address], [place], [place] [address] [phone]  [facility] [place]        [phone]       [facility] [phone]  [facility] ([facility]             [address]                                         [place], [place]                                                [phone] or [phone]                               [facility] [place] [address] [place], [address] [phone]  [facility]    [address]      [place], [place]     [phone]       [facility] [address] [place], [place] [phone]  [facility]   [address] [place], [place] [address]     [phone]      Admissions: 8am-3pm M-F  [facility] ([facility]) [address] [place], [place] [phone]  [facility]: Residential Program [phone] Days)   [place], [place]      [phone] or [phone]     [facility]: [facility] [place], [place] (Walk in Hours over the weekend or by referral)  [facility] [address] [place], [place], [place] [address] [phone]  Crisis Mobile: [facility]:  [phone] (for crisis response 24 hours a day) [facility] Hotline:      [phone] Outpatient Psychiatry and Counseling  [facility]: Mobile Crisis Management 24 hours:  [phone]  [facility] [employer] sliding scale fee and walk in schedule: M-F  8am-12pm/1pm-3pm [address]  [place], [place] [address] [phone]  [facility] [address] [place], [place] [address] [phone]  [facility])- new patient walk-in appointments available Monday - Friday 8am -3pm.          [address] [place], [place] [address] [phone] or crisis line- [phone]  [facility]/ Intensive Outpatient Therapy Program [address] [place], [place] [address] [phone]  [facility]                  Crisis Services      [phone] [address]     [place], [place] [address]                 [facility]   [facility] [phone]. [address]  [place], [place] [address]   [facility] of Care          [address] Bea Laura  [place], [place] [address]       [phone]  [facility] [address] [place], [place] [address] [phone]  [facility]    [address]    [place], [place] [address]     [phone]       Andee Poles, MD     [address]     [place] [address]     [phone]       [facility] [address] [place] [address]  [facility]     [address] [place], [place]      [phone]       [facility] Eulogio Ditch, MD [address] [place], [place] [address] [phone]  [facility]     [address]     [place], [place] [address]     [phone]       [facility] [address] [place], [place] [address] [phone] Resources for Temporary Residential Assistance/Crisis Centers  [facility]) M-F 8am-3pm   [address] [place], [place] [address]   [phone] Services include: laundry, barbering, support  groups, case management, phone  & computer access, showers, AA/NA mtgs, mental health/substance abuse nurse, job skills class, disability information, VA assistance, spiritual classes, etc.   HOMELESS SHELTERS  [facility]     [facility]   [address] [place]     [phone]              [employer] (women and children)       [address] [place], [place] [address] [phone] [email or web address] for application and process Application Required  [facility]   [address]    [place] [address]     [phone]                    [facility] [address] [place], [place] [address] [phone] application appt.) Application Required  [facility] (women only)    [address]     [place], [place] [address]     [phone]      Intake starts 6pm daily Need valid ID, SSC, & Police report [employer] [address] [place], [place] [phone] Application Required  [employer] (men only)     [address]      [place], [place]     [phone]       [facility] (Pregnant women only) [address] [place], [place] [phone]  [facility]      [address]      [place], [place] [address]     [phone]             [facility] [address] [place], [place] [phone] 90 day commitment/SA/Application process  [facility](men only)     [address]     [place], [place]     [phone]       Check-in at [employer] [facility] [place]  [address] [place], [place] [address] [phone] Men/Women/Women and Children must be there by 7 pm  [facility] [place], [place] [phone]

## 2014-12-13 NOTE — ED Notes (Signed)
[facility] called for transport request.

## 2014-12-13 NOTE — ED Notes (Signed)
Spoke with [employer] Tori. States reassess pt after 11pm to [facility]  Pending [facility] at 11pm.

## 2014-12-13 NOTE — ED Notes (Signed)
Encouraged soft food on meal tray.  Will assess if pt can tolerate solids and will update [facility].

## 2014-12-13 NOTE — ED Provider Notes (Signed)
CSN: [id number]     Arrival date & time 12/13/14  0159 History  By signing my name below, I, Tanda Rockers, attest that this documentation has been prepared under the direction and in the presence of Gilda Crease, MD. Electronically Signed: Tanda Rockers, ED Scribe. 12/13/2014. 2:08 AM.  Chief Complaint  Patient presents with  . Fall  . Alcohol Intoxication   The history is provided by the patient. No language interpreter was used.     HPI Comments: [name] Stewart is a 34 y.o. female brought in by ambulance, who presents to the Emergency Department complaining of frontal headache and right forearm laceration s/p ground level fall from alcohol intoxication. Pt fell forward onto the toilet, hitting her head and causing the toilet to break and cut her right forearm. She notes pain to her right forearm as well. Pt cannot say if she had LOC. She denies any other associated symptoms.    Past Medical History  Diagnosis Date  . Alcoholic (HCC)   . Chronic back pain   . Anxiety   . Post traumatic stress disorder (PTSD)    Past Surgical History  Procedure Laterality Date  . Knee surgery Right    No family history on file. Social History  Substance Use Topics  . Smoking status: Never Smoker   . Smokeless tobacco: None  . Alcohol Use: Yes     Comment: 1 fifth of vadka daily   OB History    No data available     Review of Systems  Musculoskeletal: Positive for arthralgias.  Skin: Positive for wound (Right forearm laceration).  Neurological: Positive for headaches.  All other systems reviewed and are negative.     Allergies  Naltrexone and Baclofen  Home Medications   Prior to Admission medications   Medication Sig Start Date End Date Taking? Authorizing Provider  ibuprofen (ADVIL,MOTRIN) 200 MG tablet Take 400-600 mg by mouth every 6 (six) hours as needed for moderate pain.   Yes Historical Provider, MD  lurasidone (LATUDA) 40 MG TABS tablet Take 40 mg by mouth daily  with breakfast.   Yes Historical Provider, MD  multivitamin-iron-minerals-folic acid (CENTRUM) chewable tablet Chew 1 tablet by mouth daily.   Yes Historical Provider, MD  naltrexone (DEPADE) 50 MG tablet Take 25 mg by mouth daily.   Yes Historical Provider, MD  norethindrone-ethinyl estradiol (NECON,BREVICON,MODICON) 0.5-35 MG-MCG tablet Take 1 tablet by mouth daily.   Yes Historical Provider, MD  valACYclovir (VALTREX) 500 MG tablet Take 500 mg by mouth daily as needed (outbreaks).   Yes Historical Provider, MD  Vilazodone HCl (VIIBRYD) 20 MG TABS Take 20 mg by mouth daily.   Yes Historical Provider, MD  chlordiazePOXIDE (LIBRIUM) 25 MG capsule  PO TID x 1D, then 25-50mg  PO BID X 1D, then 25-50mg  PO QD X 1D 12/13/14   Gilda Crease, MD  clindamycin (CLEOCIN) 150 MG capsule Take 2 capsules (300 mg total) by mouth 4 (four) times daily. 12/13/14   Gilda Crease, MD   Triage VItals: BP 127/75 mmHg  Pulse 105  Temp(Src) 98.9 F (37.2 C) (Oral)  Resp 18  SpO2 94%   Physical Exam  Constitutional: She is oriented to person, place, and time. She appears well-developed and well-nourished. No distress.  Intoxicated but appropriate Awakes easily and answers questions  HENT:  Head: Normocephalic.  Right Ear: Hearing normal.  Left Ear: Hearing normal.  Nose: Nose normal.  Mouth/Throat: Oropharynx is clear and moist and mucous membranes are normal.  Contusion right central forehead  Eyes: Conjunctivae and EOM are normal. Pupils are equal, round, and reactive to light.  Neck: Normal range of motion. Neck supple.  Cardiovascular: Regular rhythm, S1 normal and S2 normal.  Exam reveals no gallop and no friction rub.   No murmur heard. Pulmonary/Chest: Effort normal and breath sounds normal. No respiratory distress. She exhibits no tenderness.  Abdominal: Soft. Normal appearance and bowel sounds are normal. There is no hepatosplenomegaly. There is no tenderness. There is no rebound,  no guarding, no tenderness at McBurney's point and negative Murphy's sign. No hernia.  Musculoskeletal: Normal range of motion.  Neurological: She is alert and oriented to person, place, and time. She has normal strength. No cranial nerve deficit or sensory deficit. Coordination normal. GCS eye subscore is 4. GCS verbal subscore is 5. GCS motor subscore is 6.  Skin: Skin is warm and dry. Laceration (R forearm) noted. No rash noted. No cyanosis.     Psychiatric: She has a normal mood and affect. Her speech is normal and behavior is normal. Thought content normal.  Nursing note and vitals reviewed.   ED Course  Procedures (including critical care time)  LACERATION REPAIR Performed by: Gilda Crease. Authorized by: Gilda Crease Consent: Verbal consent obtained. Risks and benefits: risks, benefits and alternatives were discussed Consent given by: patient Patient identity confirmed: provided demographic data Prepped and Draped in normal sterile fashion Wound explored  Laceration Location: right forearm  Laceration Length: 3cm  No Foreign Bodies seen or palpated  Anesthesia: local infiltration  Local anesthetic: lidocaine 2% with epinephrine  Anesthetic total: 4 ml  Irrigation method: syringe Amount of cleaning: standard  Skin closure: sutures  Number of sutures: 5  Technique: simple interrupted  Patient tolerance: Patient tolerated the procedure well with no immediate complications.   DIAGNOSTIC STUDIES: Oxygen Saturation is 94% on RA, adequate by my interpretation.    COORDINATION OF CARE: 2:07 AM-Discussed treatment plan which includes CT C Spine, CT Head, DG R Forearm, CBC, BMP and EtOH with pt at bedside and pt agreed to plan.   Labs Review Labs Reviewed  BASIC METABOLIC PANEL - Abnormal; Notable for the following:    Glucose, Bld 100 (*)    Calcium 8.0 (*)    All other components within normal limits  ETHANOL - Abnormal; Notable for the  following:    Alcohol, Ethyl (B) 416 (*)    All other components within normal limits  CBC WITH DIFFERENTIAL/PLATELET    Imaging Review Dg Forearm Right  12/13/2014  CLINICAL DATA:  Status post syncope and fall in bathroom. Laceration to the right forearm from toilet seat. Initial encounter. EXAM: RIGHT FOREARM - 2 VIEW COMPARISON:  None. FINDINGS: Dorsal soft tissue swelling is noted along the mid forearm. No radiopaque foreign bodies are seen. There is no evidence of fracture or dislocation. The radius and ulna appear intact. The elbow joint is incompletely assessed, but appears grossly unremarkable. The carpal rows appear grossly intact, and demonstrate normal alignment. IMPRESSION: No evidence of fracture or dislocation. No radiopaque foreign bodies seen. Electronically Signed   By: Roanna Raider M.D.   On: 12/13/2014 03:10   Ct Head Wo Contrast  12/13/2014  CLINICAL DATA:  Status post fall in bathroom at home, with loss of consciousness. Mid forehead swelling. Concern for cervical spine injury. Initial encounter. EXAM: CT HEAD WITHOUT CONTRAST CT CERVICAL SPINE WITHOUT CONTRAST TECHNIQUE: Multidetector CT imaging of the head and cervical spine was performed following the  standard protocol without intravenous contrast. Multiplanar CT image reconstructions of the cervical spine were also generated. COMPARISON:  None. FINDINGS: CT HEAD FINDINGS There is no evidence of acute infarction, mass lesion, or intra- or extra-axial hemorrhage on CT. The posterior fossa, including the cerebellum, brainstem and fourth ventricle, is within normal limits. The third and lateral ventricles, and basal ganglia are unremarkable in appearance. The cerebral hemispheres are symmetric in appearance, with normal gray-white differentiation. No mass effect or midline shift is seen. There is no evidence of fracture; visualized osseous structures are unremarkable in appearance. The orbits are within normal limits. The  paranasal sinuses and mastoid air cells are well-aerated. Mild soft tissue swelling is noted overlying the right frontal calvarium. Metallic piercings are noted at both ears. CT CERVICAL SPINE FINDINGS There is no evidence of fracture or subluxation. Vertebral bodies demonstrate normal height and alignment. Intervertebral disc spaces are preserved. Prevertebral soft tissues are within normal limits. The visualized neural foramina are grossly unremarkable. The thyroid gland is unremarkable in appearance. The visualized lung apices are clear. No significant soft tissue abnormalities are seen. IMPRESSION: 1. No evidence of traumatic intracranial injury or fracture. 2. No evidence of fracture or subluxation along the cervical spine. 3. Mild soft tissue swelling noted overlying the right frontal calvarium. Electronically Signed   By: Roanna Raider M.D.   On: 12/13/2014 03:38   Ct Cervical Spine Wo Contrast  12/13/2014  CLINICAL DATA:  Status post fall in bathroom at home, with loss of consciousness. Mid forehead swelling. Concern for cervical spine injury. Initial encounter. EXAM: CT HEAD WITHOUT CONTRAST CT CERVICAL SPINE WITHOUT CONTRAST TECHNIQUE: Multidetector CT imaging of the head and cervical spine was performed following the standard protocol without intravenous contrast. Multiplanar CT image reconstructions of the cervical spine were also generated. COMPARISON:  None. FINDINGS: CT HEAD FINDINGS There is no evidence of acute infarction, mass lesion, or intra- or extra-axial hemorrhage on CT. The posterior fossa, including the cerebellum, brainstem and fourth ventricle, is within normal limits. The third and lateral ventricles, and basal ganglia are unremarkable in appearance. The cerebral hemispheres are symmetric in appearance, with normal gray-white differentiation. No mass effect or midline shift is seen. There is no evidence of fracture; visualized osseous structures are unremarkable in appearance. The  orbits are within normal limits. The paranasal sinuses and mastoid air cells are well-aerated. Mild soft tissue swelling is noted overlying the right frontal calvarium. Metallic piercings are noted at both ears. CT CERVICAL SPINE FINDINGS There is no evidence of fracture or subluxation. Vertebral bodies demonstrate normal height and alignment. Intervertebral disc spaces are preserved. Prevertebral soft tissues are within normal limits. The visualized neural foramina are grossly unremarkable. The thyroid gland is unremarkable in appearance. The visualized lung apices are clear. No significant soft tissue abnormalities are seen. IMPRESSION: 1. No evidence of traumatic intracranial injury or fracture. 2. No evidence of fracture or subluxation along the cervical spine. 3. Mild soft tissue swelling noted overlying the right frontal calvarium. Electronically Signed   By: Roanna Raider M.D.   On: 12/13/2014 03:38   I have personally reviewed and evaluated these images and lab results as part of my medical decision-making.   EKG Interpretation None      MDM   Final diagnoses:  Alcohol intoxication, uncomplicated (HCC)  Laceration    Presents to the ER for evaluation of injuries from a fall. Patient fell at home due to alcohol intoxication. She apparently hit her head on a toilet hard  enough to fracture the toilet. CT head and cervical spine were performed and are negative for acute injury. Patient complains of headache as well as right forearm pain. She does have a laceration on her forearm. X-ray was negative. Sutures were placed, will be removed in 10 days. Patient was seen in the ER earlier for alcohol intoxication and was given resources. She was given additional resources in a Librium taper, will be discharged when sober.     Gilda Creasehristopher J Pollina, MD 12/13/14 [phone]

## 2014-12-13 NOTE — ED Notes (Signed)
[facility] due to multiple admits.  [facility] am[facility] patient informed.  L arm laceration redressed.  Sutures intact without drainage or edema.

## 2014-12-13 NOTE — ED Notes (Signed)
Pt AAO x 3, continues to be nauseated, no vomiting at present.  Pending [facility] transfer by [facility] and report.  Monitoring for safety, Q 15 min checks in effect.
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
## 2014-12-13 NOTE — ED Notes (Signed)
Alert and reports that vomiting has subsided.  Gatorade ice chips given.  Heat pack given for lower back pain.

## 2014-12-13 NOTE — ED Notes (Signed)
Tolerated a few bites of solids and scheduled medications.

## 2014-12-13 NOTE — ED Notes (Signed)
Writer wrapped laceration with AB pad, and gauze.

## 2014-12-13 NOTE — ED Notes (Signed)
Cords removed from patients room-patient changed into paper scrubs and belongings removed from room-room in eye site of nurses station

## 2014-12-14 ENCOUNTER — Encounter (HOSPITAL_COMMUNITY): Payer: Self-pay | Admitting: *Deleted

## 2014-12-14 ENCOUNTER — Inpatient Hospital Stay (HOSPITAL_COMMUNITY)
Admission: AD | Admit: 2014-12-14 | Discharge: 2014-12-17 | DRG: 885 | Disposition: A | Discharge status: Home / Self Care | Payer: Federal, State, Local not specified - Other | Source: Intra-hospital | Attending: Psychiatry | Admitting: Psychiatry

## 2014-12-14 DIAGNOSIS — F314 Bipolar disorder, current episode depressed, severe, without psychotic features: Secondary | ICD-10-CM | POA: Diagnosis not present

## 2014-12-14 DIAGNOSIS — S51811A Laceration without foreign body of right forearm, initial encounter: Secondary | ICD-10-CM | POA: Diagnosis not present

## 2014-12-14 DIAGNOSIS — F329 Major depressive disorder, single episode, unspecified: Secondary | ICD-10-CM | POA: Diagnosis present

## 2014-12-14 DIAGNOSIS — F1020 Alcohol dependence, uncomplicated: Secondary | ICD-10-CM

## 2014-12-14 DIAGNOSIS — F4310 Post-traumatic stress disorder, unspecified: Secondary | ICD-10-CM | POA: Diagnosis not present

## 2014-12-14 MED ORDER — HYDROXYZINE HCL 25 MG PO TABS
25.0000 mg | ORAL_TABLET | Freq: Four times a day (QID) | ORAL | Status: AC | PRN
  Administered 2014-12-14 – 2014-12-16 (×5): 25 mg via ORAL
  Filled 2014-12-14 (×5): qty 1

## 2014-12-14 MED ORDER — PRAZOSIN HCL 5 MG PO CAPS
5.0000 mg | ORAL_CAPSULE | Freq: Every day | ORAL | Status: DC
  Filled 2014-12-14: qty 1

## 2014-12-14 MED ORDER — PRAZOSIN HCL 2 MG PO CAPS
2.0000 mg | ORAL_CAPSULE | Freq: Every day | ORAL | Status: DC
  Filled 2014-12-14: qty 2
  Filled 2014-12-14 (×3): qty 1

## 2014-12-14 MED ORDER — CHLORDIAZEPOXIDE HCL 25 MG PO CAPS
25.0000 mg | ORAL_CAPSULE | Freq: Four times a day (QID) | ORAL | Status: AC
  Administered 2014-12-14 – 2014-12-15 (×3): 25 mg via ORAL
  Filled 2014-12-14 (×3): qty 1

## 2014-12-14 MED ORDER — CHLORDIAZEPOXIDE HCL 25 MG PO CAPS
25.0000 mg | ORAL_CAPSULE | Freq: Four times a day (QID) | ORAL | Status: AC | PRN
  Administered 2014-12-14: 25 mg via ORAL
  Filled 2014-12-14: qty 1

## 2014-12-14 MED ORDER — VALACYCLOVIR HCL 500 MG PO TABS
500.0000 mg | ORAL_TABLET | Freq: Every day | ORAL | Status: DC | PRN
  Filled 2014-12-14: qty 1

## 2014-12-14 MED ORDER — ONDANSETRON 4 MG PO TBDP
4.0000 mg | ORAL_TABLET | Freq: Four times a day (QID) | ORAL | Status: AC | PRN
  Administered 2014-12-14: 4 mg via ORAL
  Filled 2014-12-14: qty 1

## 2014-12-14 MED ORDER — ADULT MULTIVITAMIN W/MINERALS CH
1.0000 | ORAL_TABLET | Freq: Every day | ORAL | Status: DC
  Administered 2014-12-15 – 2014-12-17 (×3): 1 via ORAL
  Filled 2014-12-14 (×6): qty 1

## 2014-12-14 MED ORDER — VITAMIN B-1 100 MG PO TABS
100.0000 mg | ORAL_TABLET | Freq: Every day | ORAL | Status: DC
  Administered 2014-12-15 – 2014-12-17 (×3): 100 mg via ORAL
  Filled 2014-12-14 (×6): qty 1

## 2014-12-14 MED ORDER — INFLUENZA VAC SPLIT QUAD 0.5 ML IM SUSY
0.5000 mL | PREFILLED_SYRINGE | INTRAMUSCULAR | Status: AC
  Administered 2014-12-15: 0.5 mL via INTRAMUSCULAR
  Filled 2014-12-14: qty 0.5

## 2014-12-14 MED ORDER — LOPERAMIDE HCL 2 MG PO CAPS
2.0000 mg | ORAL_CAPSULE | ORAL | Status: AC | PRN
  Administered 2014-12-16 (×3): 2 mg via ORAL
  Administered 2014-12-16: 4 mg via ORAL
  Filled 2014-12-14: qty 1
  Filled 2014-12-14: qty 2
  Filled 2014-12-14 (×2): qty 1

## 2014-12-14 MED ORDER — CHLORDIAZEPOXIDE HCL 25 MG PO CAPS
25.0000 mg | ORAL_CAPSULE | Freq: Three times a day (TID) | ORAL | Status: AC
  Administered 2014-12-15 – 2014-12-16 (×3): 25 mg via ORAL
  Filled 2014-12-14 (×4): qty 1

## 2014-12-14 MED ORDER — TRAZODONE HCL 100 MG PO TABS
100.0000 mg | ORAL_TABLET | Freq: Every evening | ORAL | Status: DC | PRN
  Administered 2014-12-14: 100 mg via ORAL
  Filled 2014-12-14: qty 1

## 2014-12-14 MED ORDER — VILAZODONE HCL 20 MG PO TABS
20.0000 mg | ORAL_TABLET | Freq: Every day | ORAL | Status: DC
  Administered 2014-12-14 – 2014-12-17 (×4): 20 mg via ORAL
  Filled 2014-12-14 (×5): qty 1
  Filled 2014-12-14 (×2): qty 7

## 2014-12-14 MED ORDER — CHLORDIAZEPOXIDE HCL 25 MG PO CAPS: 25.0000 mg | ORAL_CAPSULE | Freq: Every day | ORAL | Status: DC

## 2014-12-14 MED ORDER — LURASIDONE HCL 40 MG PO TABS
40.0000 mg | ORAL_TABLET | Freq: Every day | ORAL | Status: DC
  Administered 2014-12-15 – 2014-12-16 (×2): 40 mg via ORAL
  Filled 2014-12-14: qty 1
  Filled 2014-12-14: qty 7
  Filled 2014-12-14 (×3): qty 1
  Filled 2014-12-14: qty 7

## 2014-12-14 MED ORDER — LURASIDONE HCL 40 MG PO TABS: 40.0000 mg | ORAL_TABLET | Freq: Every day | ORAL | Status: DC

## 2014-12-14 MED ORDER — PROMETHAZINE HCL 25 MG RE SUPP: 25.0000 mg | Freq: Four times a day (QID) | RECTAL | Status: DC | PRN

## 2014-12-14 MED ORDER — CHLORDIAZEPOXIDE HCL 25 MG PO CAPS
25.0000 mg | ORAL_CAPSULE | ORAL | Status: AC
  Administered 2014-12-16 – 2014-12-17 (×2): 25 mg via ORAL
  Filled 2014-12-14 (×2): qty 1

## 2014-12-14 NOTE — ED Notes (Signed)
Dressing changed on r arm after pt's shower. Stitches intact and without erythema/drainage.
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
## 2014-12-14 NOTE — ED Notes (Signed)
[facility] called for transport. 

## 2014-12-14 NOTE — Tx Team (Signed)
Initial Interdisciplinary Treatment Plan   PATIENT STRESSORS: Financial difficulties Legal issue Substance abuse   PATIENT STRENGTHS: Average or above average intelligence Capable of independent living Communication skills Motivation for treatment/growth Supportive family/friends   PROBLEM LIST: Problem List/Patient Goals Date to be addressed Date deferred Reason deferred Estimated date of resolution  Chronic relapsing alcohol 12/14/2014     Alcohol use 12/14/2014     Hx of suicide attempts 12/14/2014     Legal charges 11/17/20160                                    DISCHARGE CRITERIA:  Improved stabilization in mood, thinking, and/or behavior Need for constant or close observation no longer present Withdrawal symptoms are absent or subacute and managed without 24-hour nursing intervention  PRELIMINARY DISCHARGE PLAN: Attend 12-step recovery group Return to previous living arrangement  PATIENT/FAMIILY INVOLVEMENT: This treatment plan has been presented to and reviewed with the patient, [name] Stewart.  The patient and family have been given the opportunity to ask questions and make suggestions.  Cranford MonBeaudry, Zan Orlick Evans 12/14/2014, 3:41 PM

## 2014-12-14 NOTE — [facility] Suicide Risk Assessment (Signed)
[facility]   Nursing information obtained from:    Demographic factors:    Current Mental Status:    Loss Factors:    Historical Factors:    Risk Reduction Factors:    Total Time spent with patient: 45 minutes Principal Problem: Bipolar disorder with severe depression (HCC) Diagnosis:   Patient Active Problem List   Diagnosis Date Noted  . PTSD (post-traumatic stress disorder) [F43.10] 12/14/2014  . Bipolar disorder with severe depression (HCC) [F31.4] 12/14/2014  . Alcohol use disorder, severe, dependence (HCC) [F10.20] 12/13/2014  . Metabolic acidosis [E87.2] 01/01/2014  . Alcohol withdrawal (HCC) [F10.239] 01/01/2014  . Alcohol abuse [F10.10] 01/01/2014  . GERD (gastroesophageal reflux disease) [K21.9] 01/01/2014  . Coarse tremors [G25.2] 01/01/2014     Continued Clinical Symptoms:  Alcohol Use Disorder Identification Test Final Score (AUDIT): 38 The "Alcohol Use Disorders Identification Test", Guidelines for Use in Primary Care, Second Edition.  World Science writerHealth Organization [facility](WHO). Score between 0-7:  no or low risk or alcohol related problems. Score between 8-15:  moderate risk of alcohol related problems. Score between 16-19:  high risk of alcohol related problems. Score 20 or above:  warrants further diagnostic evaluation for alcohol dependence and treatment.   CLINICAL FACTORS:   Bipolar Disorder:   Bipolar II Alcohol/Substance Abuse/Dependencies  Psychiatric Specialty Exam: Physical Exam  ROS  Blood pressure 90/69, pulse 117, temperature 98.7 F (37.1 C), temperature source Oral, resp. rate 20, height 5\' 4"  (1.626 m), weight 59.875 kg (132 lb).Body mass index is 22.65 kg/(m^2).   COGNITIVE FEATURES THAT CONTRIBUTE TO RISK:  Closed-mindedness, Polarized thinking and Thought constriction (tunnel vision)    SUICIDE RISK:   Mild:  Suicidal ideation of limited frequency, intensity, duration, and specificity.  There are no identifiable plans,  no associated intent, mild dysphoria and related symptoms, good self-control (both objective and subjective assessment), few other risk factors, and identifiable protective factors, including available and accessible social support.  PLAN OF CARE: see admission H and P  Medical Decision Making:  Review of Psycho-Social Stressors (1), Review or order clinical lab tests (1), Review of Medication Regimen & Side Effects (2) and Review of New Medication or Change in Dosage (2)  I certify that inpatient services furnished can reasonably be expected to improve the patient's condition.   Laden Fieldhouse A 12/14/2014, 5:26 PM

## 2014-12-14 NOTE — ED Notes (Signed)
[name] Pt was anxious and cooperative upon discharge. Belongings signed for and given to [employer]. Pt in NAD.

## 2014-12-14 NOTE — ED Notes (Signed)
[name] Stewart is in the shower. She reports feeling somewhat better -- was able to keep down breakfast and take meds. Pt calm and cooperative. Will continue to monitor for needs/safety.

## 2014-12-14 NOTE — Progress Notes (Signed)
[facility] Group Notes:  (Nursing/MHT/Case Management/Adjunct)  Date:  12/14/2014  Time:  2100  Type of Therapy:  wrap up group  Participation Level:  Active  Participation Quality:  Appropriate, Attentive, Sharing and Supportive  Affect:  Depressed  Cognitive:  Appropriate  Insight:  Lacking  Engagement in Group:  Engaged  Modes of Intervention:  Clarification, Education and Support  Summary of Progress/Problems:  Shelah LewandowskySquires, Dinesh Ulysse Carol 12/14/2014, 10:27 PM

## 2014-12-14 NOTE — BH Assessment (Signed)
[facility]  Per Thurman CoyerEric Kaplan, RN, [facility], pt's room assignment has been changed to 307-2.  She can be transferred after 12:00.  Pt's nurse, Clydie BraunKaren, has been notified.  Doylene Canninghomas Tehya Leath, MA Triage Specialist [phone]

## 2014-12-15 MED ORDER — IBUPROFEN 600 MG PO TABS
600.0000 mg | ORAL_TABLET | Freq: Four times a day (QID) | ORAL | Status: DC | PRN
  Administered 2014-12-15 – 2014-12-17 (×5): 600 mg via ORAL
  Filled 2014-12-15 (×3): qty 1
  Filled 2014-12-15: qty 3
  Filled 2014-12-15: qty 1

## 2014-12-15 MED ORDER — PRAZOSIN HCL 2 MG PO CAPS
4.0000 mg | ORAL_CAPSULE | Freq: Every day | ORAL | Status: DC
  Administered 2014-12-15 – 2014-12-16 (×2): 4 mg via ORAL
  Filled 2014-12-15: qty 4
  Filled 2014-12-15: qty 14
  Filled 2014-12-15: qty 2
  Filled 2014-12-15: qty 14
  Filled 2014-12-15 (×2): qty 2

## 2014-12-15 MED ORDER — TRAZODONE HCL 50 MG PO TABS
50.0000 mg | ORAL_TABLET | Freq: Every evening | ORAL | Status: DC | PRN
  Administered 2014-12-15 – 2014-12-16 (×2): 50 mg via ORAL
  Filled 2014-12-15: qty 1
  Filled 2014-12-15: qty 7
  Filled 2014-12-15: qty 1

## 2014-12-15 NOTE — Progress Notes (Signed)
  [facility] Discharge [facility] :  Will you be returning to the same living situation after discharge:  Yes,  patient plans to return home At discharge, do you have transportation home?: Yes,  patient plans to call a taxi  Do you have the ability to pay for your medications: Yes,  patient will be provided with prescriptions with discharge  Release of information consent forms completed and in the chart;  Patient's signature needed at discharge.  Patient to Follow up at: Follow-up Information    Follow up with [facility].   Why:  Please resume IOP groups on Monday 11/21.   Contact information:   [address],  [place], [place] [address] Phone: [phone]      Next level of care provider has [facility] to [facility] Link:no  Patient denies SI/HI: Yes,  denies    Safety Planning and Suicide Prevention discussed: Yes,  with patient  Have you used any form of tobacco in the last 30 days? (Cigarettes, Smokeless Tobacco, Cigars, and/or Pipes): No  Has patient been referred to the Quitline?: N/A patient is not a smoker  Pax Reasoner, Belenda CruiseKristin L 12/15/2014, 3:50 PM

## 2014-12-15 NOTE — Progress Notes (Signed)
Writer spoke with patient 1:1 and she reports having had a better day today. She has been up in the dayroom interacting appropriately with peers. She plans to attend IOP upon discharge and would like to eventually become an AA sponsor once she gets further through her sobriety. Writer encouraged her to follow through with her plans. She currently denies si/hi/a/v hallucinations. Safety maintained on unit with 15 min checks.

## 2014-12-15 NOTE — [facility] Group Notes (Signed)
[facility] LCSW Group Therapy 12/15/2014 1:15 PM Type of Therapy: Group Therapy Participation Level: Active  Participation Quality: Attentive, Sharing and Supportive  Affect: Anxious, Appropriate  Cognitive: Alert and Oriented  Insight: Developing/Improving and Engaged  Engagement in Therapy: Developing/Improving and Engaged  Modes of Intervention: Clarification, Confrontation, Discussion, Education, Exploration, Limit-setting, Orientation, Problem-solving, Rapport Building, Dance movement psychotherapisteality Testing, Socialization and Support  Summary of Progress/Problems: The topic for today was feelings about relapse. Pt discussed what relapse prevention is to them and identified triggers that they are on the path to relapse. Pt processed their feeling towards relapse and was able to relate to peers. Pt discussed coping skills that can be used for relapse prevention. Patient identified drinking as her relapse behavior and her warning signs as irritability, disturbances in her daily routine, and family concern. Patient described the incident that led to her admission with group. CSW and other group members provided patient with emotional support and encouragement.    [name] BruinKristin Keierra Stewart, MSW, [employer]CSWA Clinical Social Worker [facility] [phone]
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
## 2014-12-15 NOTE — BHH Group Notes (Signed)
   [facility] Note  12/15/2014  8:45 AM   Participation Quality: Alert, Appropriate and Oriented  Mood/Affect: Anxious  Depression Rating: 5  Anxiety Rating: 10  Thoughts of Suicide: Pt denies SI/HI  Will you contract for safety? Yes  Current AVH: Pt denies  Plan for Discharge/Comments: Pt attended discharge planning group and actively participated in group. CSW provided pt with today's workbook. Patient reported feeling tired this morning. She appeared anxious. She plans to return home and is hopeful to continue services with the [facility].   Transportation Means: Pt reports access to transportation  Supports: No supports mentioned at this time  Samuella BruinKristin Maggie Senseney, MSW, [employer] IncLCSWA Clinical Social Worker [facility] [phone]

## 2014-12-15 NOTE — Plan of Care (Signed)
Problem: Alteration in mood & ability to function due to Goal: LTG-Pt reports reduction in suicidal thoughts (Patient reports reduction in suicidal thoughts and is able to verbalize a safety plan for whenever patient is feeling suicidal)  Outcome: Progressing Patient currently denies suicidal ideations.     

## 2014-12-15 NOTE — Plan of Care (Signed)
Problem: Diagnosis: Increased Risk For Suicide Attempt Goal: STG-Patient Will Attend All Groups On The Unit Outcome: Progressing Pt attended evening group on 12/14/14     

## 2014-12-15 NOTE — Plan of Care (Signed)
Problem: Alteration in mood & ability to function due to Goal: STG-Patient will attend groups Outcome: Progressing Patient attended evening AA group.
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
## 2014-12-15 NOTE — Tx Team (Signed)
Interdisciplinary Treatment Plan Update (Adult) Date: 12/15/2014   Time Reviewed: 9:30 AM  Progress in Treatment: Attending groups: Yes Participating in groups: Yes Taking medication as prescribed: Yes Tolerating medication: Yes Family/Significant other contact made: No, patient has declined collateral contact Patient understands diagnosis: Yes Discussing patient identified problems/goals with staff: Yes Medical problems stabilized or resolved: Yes Denies suicidal/homicidal ideation: Yes Issues/concerns per patient self-inventory: Yes Other:  New problem(s) identified: N/A  Discharge Plan or Barriers: Home with outpatient at the [facility].     Reason for Continuation of Hospitalization:  Depression Anxiety Medication Stabilization   Comments: N/A  Estimated length of stay: Discharge anticipated for 11/20.   Patient is a 34 year old Caucasian female admitted for alcohol abuse and depression after falling while intoxicated. Patient lives in [place] with roommates. She has had previous treatment for alcohol abuse over the past 4 years. Stressors include: relapsing 1.5 weeks ago, strained family relationships, unemployement. Patient has identified supports as: church, [employer], parents. She plans to return home to follow up with the [facility]. Patient will benefit from crisis stabilization, medication evaluation, group therapy, and psycho education in addition to case management for discharge planning. Patient and CSW reviewed pt's identified goals and treatment plan. Pt verbalized understanding and agreed to treatment plan.   Review of initial/current patient goals per problem list:  1. Goal(s): Patient will participate in aftercare plan   Met: Yes   Target date: 3-5 days post admission date   As evidenced by: Patient will participate within aftercare plan AEB aftercare provider and housing plan at discharge being identified.   11/18: Goal met. Patient plans  to return home with outpatient services.    2. Goal (s): Patient will exhibit decreased depressive symptoms and suicidal ideations.   Met: Adequate for discharge per MD   Target date: 3-5 days post admission date   As evidenced by: Patient will utilize self rating of depression at 3 or below and demonstrate decreased signs of depression or be deemed stable for discharge by MD.   11/18: Adequate for discharge. Patient reports feeling safe for discharge anticipated for 11/20, denies SI.    3. Goal(s): Patient will demonstrate decreased signs and symptoms of anxiety.   Met: Adequate for discharge per MD   Target date: 3-5 days post admission date   As evidenced by: Patient will utilize self rating of anxiety at 3 or below and demonstrated decreased signs of anxiety, or be deemed stable for discharge by MD   11/18: Adequate for discharge. Patient reports feeling safe for discharge anticipated for 11/20.   4. Goal(s): Patient will demonstrate decreased signs of withdrawal due to substance abuse   Met: Adequate for discharge per MD   Target date: 3-5 days post admission date   As evidenced by: Patient will produce a CIWA/COWS score of 0, have stable vitals signs, and no symptoms of withdrawal   11/18: Adequate for discharge per MD. Patient experiencing anxiety and tremor but reports feeling safe for discharge anticipated for 11/20.    Attendees: Patient:    Family:    Physician: Dr. Parke Poisson; Dr. Sabra Heck 12/15/2014 9:30 AM  Nursing: Festus Aloe, Mayra Neer, Chong Sicilian [facility] 12/15/2014 9:30 AM  Clinical Social Worker: Tod Persia Riffey LCSWA 12/15/2014 9:30 AM  Other: Peri Maris, LCSW; [place], LCSW  12/15/2014 9:30 AM  Other:  12/15/2014 9:30 AM  Other: Lars Pinks, Case Manager 12/15/2014 9:30 AM  Other: Catalina Pizza, NP 12/15/2014 9:30 AM  Other:  Scribe for Treatment Team:  Tilden Fossa, MSW,  [place]

## 2014-12-15 NOTE — BHH Suicide Risk Assessment (Signed)
[facility] INPATIENT:  Family/Significant Other Suicide Prevention Education  Suicide Prevention Education:  Patient Refusal for Family/Significant Other Suicide Prevention Education: The patient [name] [place] has refused to provide written consent for family/significant other to be provided Family/Significant Other Suicide Prevention Education during admission and/or prior to discharge.  Physician notified. SPE reviewed with patient and brochure provided. Patient encouraged to return to hospital if having suicidal thoughts, patient verbalized his/her understanding and has no further questions at this time.   Iwalani [place], [place]boKristin L 12/15/2014, 12:52 PM

## 2014-12-15 NOTE — Progress Notes (Signed)
D: Per patient self inventory form pt reports she slept good last night with the use of sleep medication. She reports a fair appetite, low energy , poor concentration. She reports depression 5/10, hopelessness 3/10, anxiety 10/10- all on 0-10 scale, 10 being the worse. Pt denies SI/HI. Denies AVH. Pt c/o generalized pain 7/10. Pt reports her goal is "feeling better and making sure I can Sunday or Monday for a job interview." Pt appropriate on approach. Observed talking on hallway phone.  A:special checks q 15 mins in place for safety. Medication administered per MD order (see eMAR) Encouragement and support provided.  R:safety maintained. Complaint with medication regimen. Will continue to monitor.

## 2014-12-15 NOTE — Progress Notes (Signed)
D: Pt has anxious affect and mood.  Upon initial shift assessment, pt's main complaint was anxiety.  She reports her goal is to "sleep."  She reports she is "eating better" and "trying to drink plenty of fluids."  Pt denies SI/HI, denies hallucinations, denies pain, reports withdrawal symptoms of anxiety and tremor.  Pt has been visible in milieu interacting with peers and staff appropriately.  Pt attended evening group.   A: Introduced self to pt.  Met with pt 1:1 and provided support and encouragement.  Actively listened to pt.  PO fluids encouraged and provided.  Pt's blood pressure has been running low (see flow sheet), even after increased fluid intake.  Her Prazosin 87m PO was held.  Fall prevention techniques reviewed with pt since pt is hypotensive and reported feeling "light-headed" when standing.  Pt verbalized understanding.  PRN medication administered for anxiety, sleep, and nausea. R: Pt is compliant with medications.  Pt verbally contracts for safety.  Will continue to monitor and assess.

## 2014-12-15 NOTE — Progress Notes (Signed)
Pt attended AA group this evening.  

## 2014-12-16 MED ORDER — ACETAMINOPHEN 325 MG PO TABS
650.0000 mg | ORAL_TABLET | Freq: Once | ORAL | Status: AC
  Administered 2014-12-16: 650 mg via ORAL
  Filled 2014-12-16 (×2): qty 2

## 2014-12-16 NOTE — Plan of Care (Signed)
Problem: Consults Goal: Depression Patient Education See Patient Education Module for education specifics.  Outcome: Progressing Nurse discussed depression/coping skills with patient.        

## 2014-12-16 NOTE — Progress Notes (Signed)
D:  Patient's self inventory sheet, patient has fair sleep, sleep medication is helpful.  Fair appetite, normal energy level, good concentration.  Rated depression 3, hopeless 1, anxiety 8.  Withdrawals, diarrhea, chilling, cramping in past 24 hours.  Denied SI.  Physical problems, dry eyes, belly button infected.  Physical pain, worst pain #6 in past 24 hours, back and arm.  Pain medication not helpful.  Goal is to continue feeling better with stomach and BP.  Plans to work with RN.  Stomach pain.  Does have discharge plans.  No problems anticipated after discharge. A:  Medications administered per MD orders.  Emotional support and encouragement given patient. R:  Denied SI and HI, contracts for safety.  Denied A/V hallucinations.  Safety maintained with 15 minute checks.

## 2014-12-16 NOTE — Progress Notes (Signed)
[facility] MD Progress Note  12/16/2014 11:36 AM [name] Stewart  MRN:  [id number]   Subjective:  " I feel not myself today..."  Objective:  [name] Stewart is awake and alert, found attending group session. Denies suicidal or homicidal ideation. Patient interacts well with staff and others. Patient reports he is medication compliant without mediation side effects. States her depression 2/10. Patient states "I feeling not myself today" states that her blood pressure is low and thinks is due to the librium. Reports eating something yesterday that made her stomach upset." Reports good appetite other wise and resting well. Patient report she is excited regarding discharge. Support, encouragement and reassurance was provided.   Principal Problem: Bipolar disorder with severe depression (HCC) Diagnosis:   Patient Active Problem List   Diagnosis Date Noted  . PTSD (post-traumatic stress disorder) [F43.10] 12/14/2014  . Bipolar disorder with severe depression (HCC) [F31.4] 12/14/2014  . Alcohol use disorder, severe, dependence (HCC) [F10.20] 12/13/2014  . Metabolic acidosis [E87.2] 01/01/2014  . Alcohol withdrawal (HCC) [F10.239] 01/01/2014  . Alcohol abuse [F10.10] 01/01/2014  . GERD (gastroesophageal reflux disease) [K21.9] 01/01/2014  . Coarse tremors [G25.2] 01/01/2014   Total Time spent with patient: 45 minutes  Past Psychiatric History: see admission H and P  Past Medical History:  Past Medical History  Diagnosis Date  . Alcoholic (HCC)   . Chronic back pain   . Anxiety   . Post traumatic stress disorder (PTSD)     Past Surgical History  Procedure Laterality Date  . Knee surgery Right    Family History: History reviewed. No pertinent family history. Family Psychiatric  History: see admission H and P Social History:  History  Alcohol Use  . Yes    Comment: currently 1 fifth of vadka daily; Patient has a history of 1x Heroin use and has also used THC in the past.     History  Drug Use No     Social History   Social History  . Marital Status: Single    Spouse Name: N/A  . Number of Children: N/A  . Years of Education: N/A   Social History Main Topics  . Smoking status: Never Smoker   . Smokeless tobacco: None  . 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  . Drug Use: No  . Sexual Activity: Not Asked   Other Topics Concern  . None   Social History Narrative   Additional Social History:                   Sleep: Fair  Appetite:  Fair  Current Medications: Current Facility-Administered Medications  Medication Dose Route Frequency Provider Last Rate Last Dose  . chlordiazePOXIDE (LIBRIUM) capsule 25 mg  25 mg Oral Q6H PRN Earney Navy, NP   25 mg at 12/14/14 1508  . chlordiazePOXIDE (LIBRIUM) capsule 25 mg  25 mg Oral BH-qamhs Earney Navy, NP       Followed by  . [START ON 12/18/2014] chlordiazePOXIDE (LIBRIUM) capsule 25 mg  25 mg Oral Daily Earney Navy, NP      . hydrOXYzine (ATARAX/VISTARIL) tablet 25 mg  25 mg Oral Q6H PRN Earney Navy, NP   25 mg at 12/15/14 2002  . ibuprofen (ADVIL,MOTRIN) tablet 600 mg  600 mg Oral Q6H PRN Worthy Flank, NP   600 mg at 12/16/14 0855  .  loperamide (IMODIUM) capsule 2-4 mg  2-4 mg Oral PRN Earney Navy, NP   2 mg at 12/16/14 0824  . lurasidone (LATUDA) tablet 40 mg  40 mg Oral Q breakfast Rachael Fee, MD   40 mg at 12/16/14 0747  . multivitamin with minerals tablet 1 tablet  1 tablet Oral Daily Earney Navy, NP   1 tablet at 12/16/14 0747  . ondansetron (ZOFRAN-ODT) disintegrating tablet 4 mg  4 mg Oral Q6H PRN Earney Navy, NP   4 mg at 12/14/14 1953  . prazosin (MINIPRESS) capsule 4 mg  4 mg Oral QHS Rachael Fee, MD   4 mg at 12/15/14 2141  . promethazine (PHENERGAN) suppository 25 mg  25 mg Rectal Q6H PRN Earney Navy, NP      . thiamine (VITAMIN B-1) tablet 100 mg   100 mg Oral Daily Earney Navy, NP   100 mg at 12/16/14 0747  . traZODone (DESYREL) tablet 50 mg  50 mg Oral QHS PRN Rachael Fee, MD   50 mg at 12/15/14 2247  . valACYclovir (VALTREX) tablet 500 mg  500 mg Oral Daily PRN Earney Navy, NP      . Vilazodone HCl TABS 20 mg  20 mg Oral Daily Earney Navy, NP   20 mg at 12/16/14 0747    Lab Results: No results found for this or any previous visit (from the past 48 hour(s)).  Physical Findings: AIMS: Facial and Oral Movements Muscles of Facial Expression: None, normal Lips and Perioral Area: None, normal Jaw: None, normal Tongue: None, normal,Extremity Movements Upper (arms, wrists, hands, fingers): None, normal Lower (legs, knees, ankles, toes): None, normal, Trunk Movements Neck, shoulders, hips: None, normal, Overall Severity Severity of abnormal movements (highest score from questions above): None, normal Incapacitation due to abnormal movements: None, normal Patient's awareness of abnormal movements (rate only patient's report): No Awareness, Dental Status Current problems with teeth and/or dentures?: No Does patient usually wear dentures?: No  CIWA:  CIWA-Ar Total: 1 COWS:     Musculoskeletal: Strength & Muscle Tone: within normal limits Gait & Station: normal Patient leans: normal  Psychiatric Specialty Exam: Review of Systems  Constitutional: Negative.   HENT: Negative.   Eyes: Negative.   Respiratory: Negative.   Cardiovascular: Negative.   Gastrointestinal: Negative.   Genitourinary: Negative.   Musculoskeletal: Negative.        Arm pain 8/10  Skin: Negative.        Right are with laceration from a fall, reports six/sutures.   Neurological: Negative.   Endo/Heme/Allergies: Negative.   Psychiatric/Behavioral: Positive for substance abuse. The patient is nervous/anxious.   All other systems reviewed and are negative.   Blood pressure 83/53, pulse 138, temperature 98.6 F (37 C), temperature  source Oral, resp. rate 16, height  (1.626 m), weight 59.875 kg (132 lb).Body mass index is 22.65 kg/(m^2).  General Appearance: Fairly Groomed  Patent attorney::  Fair  Speech:  Clear and Coherent  Volume:  Decreased  Mood:  Anxious and sad dissapointed on herself worried  Affect:  sad anxious worried  Thought Process:  Coherent and Goal Directed  Orientation:  Full (Time, Place, and Person)  Thought Content:  Hallucinations: None and symptoms events worries concerns  Suicidal Thoughts:  No  Homicidal Thoughts:  No  Memory:  Immediate;   Fair Recent;   Fair Remote;   Fair  Judgement:  Fair  Insight:  Present and Shallow  Psychomotor Activity:  Restlessness  Concentration:  Fair  Recall:  FiservFair  Fund of Knowledge:Fair  Language: Fair  Akathisia:  No  Handed:  Right  AIMS (if indicated):     Assets:  Desire for Improvement Housing Social Support Talents/Skills Vocational/Educational  ADL's:  Intact  Cognition: WNL  Sleep:  Number of Hours: 6   I agree with current treatment plan  Treatment Plan Summary: Daily contact with patient to assess and evaluate symptoms and progress in treatment and Medication management Supportive approach/coping skills Alcohol dependence; continue the Librium detox protocol/work a relapse prevention plan mood instability; continue the Latuda 40 mg daily Depression; continue the Viibryd 20 mg daily Help process the negative feelings associated to the relapse Use CBT/mindfulness She wants to be abe to be D/C on Sunday so she can go home take care of things and be ready for the job interview on Tuesday  Oneta Rackanika N Lewis FNP-BC 12/16/2014, 11:36 AM Agree with NP Progress Note, as above  Nehemiah MassedFernando Cobos, MD

## 2014-12-16 NOTE — [facility] Group Notes (Signed)

## 2014-12-16 NOTE — [facility] Group Notes (Signed)
[facility] Group Notes:  (Clinical Social Work)   11/25/2014     10:00-11:00AM  Summary of Progress/Problems:   In today's process group a decisional balance exercise was used to explore in depth the perceived benefits and costs of alcohol and drugs, as well as the  benefits and costs of replacing these with healthy coping skills.  Patients listed healthy and unhealthy coping techniques, determining with CSW guidance that unhealthy coping techniques work initially, but eventually become harmful.  Motivational Interviewing and the whiteboard were utilized for the exercises.  The patient expressed that the unhealthy coping she often uses include drinking, isolation, avoiding people and problems, reckless spending, binging, and poor self-care.  She does healthy things for herself like exercise, volunteering at church, attending AA meetings, and having a therapist.  Type of Therapy:  Group Therapy - Process   Participation Level:  Active  Participation Quality:  Attentive and Sharing  Affect:  Depressed and Flat  Cognitive:  Appropriate  Insight:  Engaged  Engagement in Therapy:  Engaged  Modes of Intervention:  Education, Motivational Interviewing  Ambrose MantleMareida Grossman-Orr, LCSW 12/16/2014, 12:33 PM

## 2014-12-16 NOTE — Progress Notes (Signed)
Patient did attend the evening speaker AA meeting.  

## 2014-12-17 MED ORDER — TRAZODONE HCL 50 MG PO TABS: 50.0000 mg | ORAL_TABLET | Freq: Every evening | ORAL | Status: DC | PRN

## 2014-12-17 MED ORDER — LURASIDONE HCL 40 MG PO TABS: 40.0000 mg | ORAL_TABLET | Freq: Every day | ORAL | Status: DC

## 2014-12-17 MED ORDER — VALACYCLOVIR HCL 500 MG PO TABS: 500.0000 mg | ORAL_TABLET | Freq: Every day | ORAL | Status: DC | PRN

## 2014-12-17 MED ORDER — VILAZODONE HCL 20 MG PO TABS: 20.0000 mg | ORAL_TABLET | Freq: Every day | ORAL | Status: DC

## 2014-12-17 MED ORDER — PRAZOSIN HCL 2 MG PO CAPS: 4.0000 mg | ORAL_CAPSULE | Freq: Every day | ORAL | Status: DC
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
## 2014-12-17 NOTE — [facility] Suicide Risk Assessment (Addendum)
[facility] Discharge Suicide Risk Assessment   Demographic Factors:  34 year old divorced female, lives with roommate, currently unemployed   Total Time spent with patient: 30 minutes  Musculoskeletal: Strength & Muscle Tone: within normal limits Gait & Station: normal Patient leans: N/A  Psychiatric Specialty Exam: Physical Exam  ROS  Blood pressure 85/56, pulse 130, temperature 97.4 F (36.3 C), temperature source Oral, resp. rate 16, height  (1.626 m), weight 132 lb (59.875 kg).Body mass index is 22.65 kg/(m^2).  General Appearance: Well Groomed  Eye Contact::  Good  Speech:  Normal Rate409  Volume:  Normal  Mood:  mood improved, minimizes depression  Affect:  Appropriate, somewhat anxious   Thought Process:  Linear  Orientation:  Full (Time, Place, and Person)  Thought Content:  no hallucinations, no delusions, not internally preoccupied   Suicidal Thoughts:  No denies any self injurious or suicidal ideations   Homicidal Thoughts:  No  Memory:  recent and remote grossly intact   Judgement:  Other:  improved   Insight:  improved   Psychomotor Activity:  Normal  Concentration:  Good  Recall:  Good  Fund of Knowledge:Good  Language: Good  Akathisia:  Negative  Handed:  Right  AIMS (if indicated):     Assets:  Communication Skills Desire for Improvement Resilience  Sleep:  Number of Hours: 6  Cognition: WNL  ADL's:  Intact   Have you used any form of tobacco in the last 30 days? (Cigarettes, Smokeless Tobacco, Cigars, and/or Pipes): No  Has this patient used any form of tobacco in the last 30 days? 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  Loss Factors: Recent job loss, recent DUI   Historical Factors: States she has been diagnosed with Bipolar Disorder, history of alcohol dependence . States she has history of PTSD .  Risk Reduction Factors:   Sense of responsibility to family, Living with another person, especially a relative, Positive social support and Positive coping skills or problem solving skills  Continued Clinical Symptoms:  As noted, currently improved compared to admission- some residual anxiety, but in general feeling better, denies SI or HI, and future oriented   Cognitive Features That Contribute To Risk:  No gross cognitive deficits noted upon discharge. Is alert , attentive, and oriented x 3   Suicide Risk:  Mild:  Suicidal ideation of limited frequency, intensity, duration, and specificity.  There are no identifiable plans, no associated intent, mild dysphoria and related symptoms, good self-control (both objective and subjective assessment), few other risk factors, and identifiable protective factors, including available and accessible social support.  Principal Problem: Bipolar disorder with severe depression [facility]) Discharge Diagnoses:  Patient Active Problem List   Diagnosis Date Noted  . PTSD (post-traumatic stress disorder) [F43.10] 12/14/2014  . Bipolar disorder with severe depression (HCC) [F31.4] 12/14/2014  . Alcohol use disorder, severe, dependence (HCC) [F10.20] 12/13/2014  . Metabolic acidosis [E87.2] 01/01/2014  .  Alcohol withdrawal (HCC) [F10.239] 01/01/2014  . Alcohol abuse [F10.10] 01/01/2014  . GERD (gastroesophageal reflux disease) [K21.9] 01/01/2014  . Coarse tremors [G25.2] 01/01/2014    Follow-up Information     Follow up with [facility].   Why:  Please resume IOP groups on Monday 11/21.   Contact information:   [address],  [place], [place] [address] Phone: [phone]      Plan Of Care/Follow-up recommendations:  Activity:  as tolerated  Diet:  regular Tests:  NA Other:  see below   Is patient on multiple antipsychotic therapies at discharge:  No   Has Patient had three or more failed trials of antipsychotic monotherapy by history:  No  Recommended Plan for Multiple Antipsychotic Therapies: NA  Patient states she is leaving in good spirits. Plans to return home. Plans to follow up at [facility] IOP as above. Has established PCP, Dr. Leavy CellaBoyd , for medical issues as needed , and to follow up on her forearm wound / suture removal . Plans to work on abstinence, relapse prevention efforts, plans to go to AA regularly, has a sponsor.  Jermeka Schlotterbeck 12/17/2014, 1:02 PM

## 2014-12-17 NOTE — [facility] Group Notes (Signed)
[facility] Group Notes:  (Nursing/MHT/Case Management/Adjunct)  Date:  12/17/2014  Time:  1;45pm  Type of Therapy:  Nurse Education  Participation Level:  Active  Participation Quality:  Appropriate  Affect:  Anxious and Appropriate  Cognitive:  Alert and Appropriate  Insight:  Appropriate and Improving  Engagement in Group:  Developing/Improving  Modes of Intervention:  Education and Problem-solving  Summary of Progress/Problems: Group discussion on healthy support systems, pt identified her mom and her church as her support system.  Jimmey Ralpherez, Freddi Forster M 12/17/2014, 2:59 PM

## 2014-12-17 NOTE — Progress Notes (Signed)
Discharge instructions/medications/follow up appointments discussed with pt. Prescriptions given, samples given, and patients belongings returned to pt Dsg change to right forearm, sutures are intact. Pt verbalizes having a job interview on Tuesday.  Pt verbalizes understanding.  Pt denies SI/HI/AVH.

## 2014-12-17 NOTE — Progress Notes (Signed)
Writer has observed patient up in the dayroom interacting with peers and has c/o severe abdominal pain d/t her menstrual cycle but remains in dayroom with peers after receiving medications. She has requested several prn medications this evening for complaints of anxiety, diarrhea, pain and insomnia which she received. She attended AA meeting. Support given and safety maintained on unit with 15 min checks.

## 2014-12-17 NOTE — [facility] Group Notes (Signed)
[facility] Group Notes:  (Clinical Social Work)  12/17/2014  10:00-11:00AM  Summary of Progress/Problems:   The main focus of today's process group was to   1)  discuss the importance of adding supports  2)  define health supports versus unhealthy supports  3)  identify the patient's current unhealthy supports and plan how to handle them  4)  Identify the patient's current healthy supports and plan what to add.  An emphasis was placed on using counselor, doctor, therapy groups, 12-step groups, and problem-specific support groups to expand supports.    The patient expressed full comprehension of the concepts presented, and agreed that there is a need to add more supports.  The patient stated she has learned a lot in therapy, shared quite a few things throughout group that were helpful to others.  She has asked her parents to go to [facility] and they are learning some things to help them cope with her illness.  Additionally, she has asked them to back off and allow her to deal with the consequences of her actions instead of coming to her rescue.  She also talked about her decision to kick out of her home a person she had allowed to stay there who was bringing alcohol into the home.  Type of Therapy:  Process Group with Motivational Interviewing  Participation Level:  Active  Participation Quality:  Attentive, Sharing and Supportive  Affect:  Blunted  Cognitive:  Appropriate and Oriented  Insight:  Engaged  Engagement in Therapy:  Engaged  Modes of Intervention:   Education, Support and Processing, Activity  Ambrose MantleMareida Grossman-Orr, LCSW 12/17/2014

## 2014-12-17 NOTE — Plan of Care (Signed)
Problem: Alteration in mood & ability to function due to Goal: STG-Pt will be introduced to the 12-step program of recovery (Patient will be introduced to the 12-step program of recovery and disease concept of addiction)  Outcome: Progressing Patient attended AA meeting on 12/16/14.

## 2015-01-18 ENCOUNTER — Other Ambulatory Visit (HOSPITAL_COMMUNITY): Payer: Self-pay | Admitting: Psychiatry

## 2015-01-31 ENCOUNTER — Inpatient Hospital Stay (HOSPITAL_COMMUNITY): Payer: Federal, State, Local not specified - PPO

## 2015-01-31 ENCOUNTER — Encounter (HOSPITAL_COMMUNITY): Payer: Self-pay | Admitting: Emergency Medicine

## 2015-01-31 ENCOUNTER — Inpatient Hospital Stay (HOSPITAL_COMMUNITY)
Admission: EM | Admit: 2015-01-31 | Discharge: 2015-02-01 | DRG: 897 | Disposition: A | Discharge status: Home / Self Care | Payer: Self-pay | Attending: Student in an Organized Health Care Education/Training Program | Admitting: Student in an Organized Health Care Education/Training Program

## 2015-01-31 DIAGNOSIS — F419 Anxiety disorder, unspecified: Secondary | ICD-10-CM | POA: Diagnosis present

## 2015-01-31 DIAGNOSIS — F1010 Alcohol abuse, uncomplicated: Secondary | ICD-10-CM

## 2015-01-31 DIAGNOSIS — F10939 Alcohol use, unspecified with withdrawal, unspecified: Secondary | ICD-10-CM | POA: Diagnosis present

## 2015-01-31 DIAGNOSIS — Y908 Blood alcohol level of 240 mg/100 ml or more: Secondary | ICD-10-CM | POA: Diagnosis present

## 2015-01-31 DIAGNOSIS — F429 Obsessive-compulsive disorder, unspecified: Secondary | ICD-10-CM | POA: Diagnosis present

## 2015-01-31 DIAGNOSIS — Z23 Encounter for immunization: Secondary | ICD-10-CM

## 2015-01-31 DIAGNOSIS — F10239 Alcohol dependence with withdrawal, unspecified: Principal | ICD-10-CM | POA: Diagnosis present

## 2015-01-31 DIAGNOSIS — E872 Acidosis, unspecified: Secondary | ICD-10-CM | POA: Diagnosis present

## 2015-01-31 DIAGNOSIS — F314 Bipolar disorder, current episode depressed, severe, without psychotic features: Secondary | ICD-10-CM | POA: Diagnosis present

## 2015-01-31 DIAGNOSIS — F1020 Alcohol dependence, uncomplicated: Secondary | ICD-10-CM | POA: Diagnosis present

## 2015-01-31 DIAGNOSIS — R000 Tachycardia, unspecified: Secondary | ICD-10-CM | POA: Diagnosis present

## 2015-01-31 DIAGNOSIS — F4310 Post-traumatic stress disorder, unspecified: Secondary | ICD-10-CM | POA: Diagnosis present

## 2015-01-31 HISTORY — DX: Bipolar disorder, unspecified: F31.9

## 2015-01-31 HISTORY — DX: Major depressive disorder, single episode, unspecified: F32.9

## 2015-01-31 HISTORY — DX: Depression, unspecified: F32.A

## 2015-01-31 HISTORY — DX: Chronic tension-type headache, not intractable: G44.229

## 2015-01-31 HISTORY — DX: Unspecified convulsions: R56.9

## 2015-01-31 LAB — CBC WITH DIFFERENTIAL/PLATELET
BASOS PCT: 1 %
Basophils Absolute: 0 10*3/uL (ref 0.0–0.1)
Eosinophils Absolute: 0 10*3/uL (ref 0.0–0.7)
Eosinophils Relative: 0 %
HEMATOCRIT: 43.5 % (ref 36.0–46.0)
HEMOGLOBIN: 14.8 g/dL (ref 12.0–15.0)
Lymphocytes Relative: 51 %
Lymphs Abs: 3.5 10*3/uL (ref 0.7–4.0)
MCH: 29.7 pg (ref 26.0–34.0)
MCHC: 34 g/dL (ref 30.0–36.0)
MCV: 87.3 fL (ref 78.0–100.0)
MONOS PCT: 8 %
Monocytes Absolute: 0.6 10*3/uL (ref 0.1–1.0)
NEUTROS ABS: 2.7 10*3/uL (ref 1.7–7.7)
NEUTROS PCT: 40 %
Platelets: 315 10*3/uL (ref 150–400)
RBC: 4.98 MIL/uL (ref 3.87–5.11)
RDW: 12.7 % (ref 11.5–15.5)
WBC: 6.8 10*3/uL (ref 4.0–10.5)

## 2015-01-31 LAB — COMPREHENSIVE METABOLIC PANEL
ALBUMIN: 4 g/dL (ref 3.5–5.0)
ALK PHOS: 27 U/L — AB (ref 38–126)
ALT: 46 U/L (ref 14–54)
AST: 44 U/L — ABNORMAL HIGH (ref 15–41)
Anion gap: 16 — ABNORMAL HIGH (ref 5–15)
BILIRUBIN TOTAL: 0.9 mg/dL (ref 0.3–1.2)
BUN: 13 mg/dL (ref 6–20)
CALCIUM: 8.6 mg/dL — AB (ref 8.9–10.3)
CO2: 21 mmol/L — AB (ref 22–32)
CREATININE: 0.63 mg/dL (ref 0.44–1.00)
Chloride: 104 mmol/L (ref 101–111)
GFR calc Af Amer: >60 mL/min (ref >60)
GFR calc non Af Amer: >60 mL/min (ref >60)
GLUCOSE: 120 mg/dL — AB (ref 65–99)
Potassium: 3.9 mmol/L (ref 3.5–5.1)
SODIUM: 141 mmol/L (ref 135–145)
Total Protein: 7.6 g/dL (ref 6.5–8.1)

## 2015-01-31 LAB — RAPID URINE DRUG SCREEN, HOSP PERFORMED
AMPHETAMINES: NOT DETECTED
BARBITURATES: NOT DETECTED
BENZODIAZEPINES: NOT DETECTED
COCAINE: NOT DETECTED
Opiates: NOT DETECTED
TETRAHYDROCANNABINOL: NOT DETECTED

## 2015-01-31 LAB — PROTIME-INR
INR: 1.12 (ref 0.00–1.49)
Prothrombin Time: 14.6 seconds (ref 11.6–15.2)

## 2015-01-31 LAB — ETHANOL: Alcohol, Ethyl (B): 236 mg/dL — ABNORMAL HIGH (ref <5)

## 2015-01-31 LAB — I-STAT BETA HCG BLOOD, ED (MC, WL, AP ONLY): I-stat hCG, quantitative: <5 m[IU]/mL (ref <5)

## 2015-01-31 MED ORDER — DEXTROSE-NACL 5-0.45 % IV SOLN
INTRAVENOUS | Status: AC
  Administered 2015-01-31: 14:00:00 via INTRAVENOUS

## 2015-01-31 MED ORDER — ONDANSETRON HCL 4 MG PO TABS: 4.0000 mg | ORAL_TABLET | Freq: Four times a day (QID) | ORAL | Status: DC | PRN

## 2015-01-31 MED ORDER — SODIUM CHLORIDE 0.9 % IV BOLUS (SEPSIS)
1000.0000 mL | Freq: Once | INTRAVENOUS | Status: AC
  Administered 2015-01-31: 1000 mL via INTRAVENOUS

## 2015-01-31 MED ORDER — ENOXAPARIN SODIUM 40 MG/0.4ML ~~LOC~~ SOLN
40.0000 mg | SUBCUTANEOUS | Status: DC
  Administered 2015-01-31: 40 mg via SUBCUTANEOUS
  Filled 2015-01-31: qty 0.4

## 2015-01-31 MED ORDER — LORAZEPAM 1 MG PO TABS: 0.0000 mg | ORAL_TABLET | Freq: Four times a day (QID) | ORAL | Status: DC

## 2015-01-31 MED ORDER — LURASIDONE HCL 80 MG PO TABS
80.0000 mg | ORAL_TABLET | ORAL | Status: DC
  Administered 2015-02-01: 80 mg via ORAL
  Filled 2015-01-31 (×3): qty 1

## 2015-01-31 MED ORDER — LORAZEPAM 2 MG/ML IJ SOLN
2.0000 mg | Freq: Once | INTRAMUSCULAR | Status: AC
  Administered 2015-01-31: 2 mg via INTRAVENOUS
  Filled 2015-01-31: qty 1

## 2015-01-31 MED ORDER — ONDANSETRON HCL 4 MG/2ML IJ SOLN: 4.0000 mg | Freq: Four times a day (QID) | INTRAMUSCULAR | Status: DC | PRN

## 2015-01-31 MED ORDER — LORAZEPAM 2 MG/ML IJ SOLN
0.0000 mg | Freq: Two times a day (BID) | INTRAMUSCULAR | Status: DC
Start: 2015-02-02 — End: 2015-01-31

## 2015-01-31 MED ORDER — FOLIC ACID 1 MG PO TABS
1.0000 mg | ORAL_TABLET | Freq: Every day | ORAL | Status: DC
  Administered 2015-01-31 – 2015-02-01 (×2): 1 mg via ORAL
  Filled 2015-01-31 (×2): qty 1

## 2015-01-31 MED ORDER — LURASIDONE HCL 80 MG PO TABS
80.0000 mg | ORAL_TABLET | Freq: Every day | ORAL | Status: DC
  Filled 2015-01-31 (×2): qty 2

## 2015-01-31 MED ORDER — LORAZEPAM 2 MG/ML IJ SOLN
1.0000 mg | Freq: Four times a day (QID) | INTRAMUSCULAR | Status: DC | PRN
  Administered 2015-01-31 – 2015-02-01 (×2): 1 mg via INTRAVENOUS
  Filled 2015-01-31 (×3): qty 1

## 2015-01-31 MED ORDER — SODIUM CHLORIDE 0.9 % IJ SOLN
3.0000 mL | Freq: Two times a day (BID) | INTRAMUSCULAR | Status: DC
Start: 2015-01-31 — End: 2015-02-01
  Administered 2015-01-31 (×2): 3 mL via INTRAVENOUS

## 2015-01-31 MED ORDER — CHLORDIAZEPOXIDE HCL 5 MG PO CAPS: 10.0000 mg | ORAL_CAPSULE | Freq: Four times a day (QID) | ORAL | Status: DC | PRN

## 2015-01-31 MED ORDER — LORAZEPAM 2 MG/ML IJ SOLN
1.0000 mg | Freq: Once | INTRAMUSCULAR | Status: AC
  Administered 2015-01-31: 1 mg via INTRAVENOUS
  Filled 2015-01-31: qty 1

## 2015-01-31 MED ORDER — CHLORDIAZEPOXIDE HCL 5 MG PO CAPS
50.0000 mg | ORAL_CAPSULE | Freq: Once | ORAL | Status: AC
  Administered 2015-01-31: 50 mg via ORAL
  Filled 2015-01-31: qty 10

## 2015-01-31 MED ORDER — VITAMIN B-1 100 MG PO TABS
100.0000 mg | ORAL_TABLET | Freq: Every day | ORAL | Status: DC
  Administered 2015-01-31 – 2015-02-01 (×2): 100 mg via ORAL
  Filled 2015-01-31 (×2): qty 1

## 2015-01-31 MED ORDER — PNEUMOCOCCAL VAC POLYVALENT 25 MCG/0.5ML IJ INJ
0.5000 mL | INJECTION | INTRAMUSCULAR | Status: AC
  Administered 2015-02-01: 0.5 mL via INTRAMUSCULAR
  Filled 2015-01-31: qty 0.5

## 2015-01-31 MED ORDER — ADULT MULTIVITAMIN W/MINERALS CH
1.0000 | ORAL_TABLET | Freq: Every day | ORAL | Status: DC
  Administered 2015-01-31 – 2015-02-01 (×2): 1 via ORAL
  Filled 2015-01-31 (×2): qty 1

## 2015-01-31 MED ORDER — LORAZEPAM 2 MG/ML IJ SOLN
0.0000 mg | Freq: Four times a day (QID) | INTRAMUSCULAR | Status: DC
  Administered 2015-01-31: 2 mg via INTRAVENOUS
  Filled 2015-01-31: qty 1

## 2015-01-31 MED ORDER — PRAZOSIN HCL 2 MG PO CAPS: 4.0000 mg | ORAL_CAPSULE | Freq: Every day | ORAL | Status: DC

## 2015-01-31 MED ORDER — VILAZODONE HCL 20 MG PO TABS: 40.0000 mg | ORAL_TABLET | Freq: Every day | ORAL | Status: DC

## 2015-01-31 MED ORDER — VALACYCLOVIR HCL 500 MG PO TABS: 500.0000 mg | ORAL_TABLET | Freq: Every day | ORAL | Status: DC | PRN

## 2015-01-31 MED ORDER — VILAZODONE HCL 40 MG PO TABS
40.0000 mg | ORAL_TABLET | Freq: Every day | ORAL | Status: DC
  Administered 2015-01-31 – 2015-02-01 (×2): 40 mg via ORAL
  Filled 2015-01-31 (×2): qty 1

## 2015-01-31 MED ORDER — PRAZOSIN HCL 2 MG PO CAPS
3.0000 mg | ORAL_CAPSULE | Freq: Every day | ORAL | Status: DC
  Administered 2015-02-01: 3 mg via ORAL
  Filled 2015-01-31 (×3): qty 1

## 2015-01-31 MED ORDER — LORAZEPAM 1 MG PO TABS: 1.0000 mg | ORAL_TABLET | Freq: Four times a day (QID) | ORAL | Status: DC | PRN

## 2015-01-31 MED ORDER — LORAZEPAM 1 MG PO TABS: 0.0000 mg | ORAL_TABLET | Freq: Two times a day (BID) | ORAL | Status: DC

## 2015-01-31 MED ORDER — CHLORDIAZEPOXIDE HCL 5 MG PO CAPS
50.0000 mg | ORAL_CAPSULE | Freq: Two times a day (BID) | ORAL | Status: DC
  Administered 2015-02-01: 50 mg via ORAL
  Filled 2015-01-31: qty 10

## 2015-01-31 NOTE — ED Provider Notes (Signed)
CSN: [id number]     Arrival date & time 01/31/15  4098 History   First MD Initiated Contact with Patient 01/31/15 0848     Chief Complaint  Patient presents with  . Detox      (Consider location/radiation/quality/duration/timing/severity/associated sxs/prior Treatment) HPI   Blood pressure 121/88, pulse 104, temperature 97.9 F (36.6 C), temperature source Oral, resp. rate 19, SpO2 96 %.  [name] Stewart is a 35 y.o. female requesting detox from alcohol. Prior history of seizures from withdrawals, no hallucinations. States her last drink is unknown, she detoxed a month ago but has been drinking since Friday. Denies suicidal ideation, homicidal ideation, drug use. States she has a history of bipolar and has been off of her psych meds for several weeks. Denies fever, chills, chest pain, shortness of breath, abdominal pain. On review of systems she endorses nausea with global headache.  Past Medical History  Diagnosis Date  . Alcoholic (HCC)   . Chronic back pain   . Anxiety   . Post traumatic stress disorder (PTSD)    Past Surgical History  Procedure Laterality Date  . Knee surgery Right    No family history on file. Social History  Substance Use Topics  . Smoking status: Never Smoker   . Smokeless tobacco: None  . 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   OB History    No data available     Review of Systems  10 systems reviewed and found to be negative, except as noted in the HPI.   Allergies  Naltrexone and Baclofen  Home Medications   Prior to Admission medications   Medication Sig Start Date End Date Taking? Authorizing Provider  lurasidone (LATUDA) 40 MG TABS tablet Take 1 tablet (40 mg total) by mouth daily with breakfast. Patient taking differently: Take 80 mg by mouth daily with breakfast.  12/17/14  Yes Oneta Rack, NP  multivitamin-iron-minerals-folic acid  (CENTRUM) chewable tablet Chew 1 tablet by mouth daily.   Yes Historical Provider, MD  naltrexone (DEPADE) 50 MG tablet Take 25 mg by mouth daily.   Yes Historical Provider, MD  prazosin (MINIPRESS) 1 MG capsule Take 3 mg by mouth at bedtime.   Yes Historical Provider, MD  Vilazodone HCl (VIIBRYD) 20 MG TABS Take 1 tablet (20 mg total) by mouth daily. Patient taking differently: Take 40 mg by mouth daily.  12/17/14  Yes Oneta Rack, NP  prazosin (MINIPRESS) 2 MG capsule Take 2 capsules (4 mg total) by mouth at bedtime. Patient not taking: Reported on 01/31/2015 12/17/14   Oneta Rack, NP  traZODone (DESYREL) 50 MG tablet Take 1 tablet (50 mg total) by mouth at bedtime as needed for sleep. Patient not taking: Reported on 01/31/2015 12/17/14   Oneta Rack, NP  valACYclovir (VALTREX) 500 MG tablet Take 1 tablet (500 mg total) by mouth daily as needed (outbreaks). Patient not taking: Reported on 01/31/2015 12/17/14   Oneta Rack, NP   BP 123/94 mmHg  Pulse 113  Temp(Src) 97.9 F (36.6 C) (Oral)  Resp 16  SpO2 97% Physical Exam  Constitutional: She is oriented to person, place, and time. She appears well-developed and well-nourished. No distress.  Clammy, wretching  HENT:  Head: Normocephalic.  Eyes: Conjunctivae and EOM are normal.  Cardiovascular: Normal rate.   Pulmonary/Chest: Effort normal. No stridor.  Musculoskeletal: Normal range  of motion.  Neurological: She is alert and oriented to person, place, and time.  Positive hand tremors  Psychiatric: She has a normal mood and affect.  Nursing note and vitals reviewed.   ED Course  Procedures (including critical care time) Labs Review Labs Reviewed  COMPREHENSIVE METABOLIC PANEL - Abnormal; Notable for the following:    CO2 21 (*)    Glucose, Bld 120 (*)    Calcium 8.6 (*)    AST 44 (*)    Alkaline Phosphatase 27 (*)    Anion gap 16 (*)    All other components within normal limits  ETHANOL - Abnormal; Notable for the  following:    Alcohol, Ethyl (B) 236 (*)    All other components within normal limits  CBC WITH DIFFERENTIAL/PLATELET  URINE RAPID DRUG SCREEN, HOSP PERFORMED  I-STAT BETA HCG BLOOD, ED ([facility], [facility], [facility] ONLY)    Imaging Review No results found. I have personally reviewed and evaluated these images and lab results as part of my medical decision-making.   EKG Interpretation None      MDM   Final diagnoses:  Alcohol abuse    Filed Vitals:   01/31/15 1054 01/31/15 1100 01/31/15 1145 01/31/15 1224  BP:  123/94 108/84 108/84  Pulse: 113  101   Temp:      TempSrc:      Resp: 16  16 16   SpO2: 97%  96% 100%    Medications  prazosin (MINIPRESS) capsule 4 mg (not administered)  valACYclovir (VALTREX) tablet 500 mg (not administered)  lurasidone (LATUDA) tablet 40 mg (not administered)  LORazepam (ATIVAN) tablet 0-4 mg (0 mg Oral Duplicate 01/31/15 1150)    Followed by  LORazepam (ATIVAN) tablet 0-4 mg (not administered)  sodium chloride 0.9 % bolus 1,000 mL (0 mLs Intravenous Stopped 01/31/15 1131)  LORazepam (ATIVAN) injection 2 mg (2 mg Intravenous Given 01/31/15 0935)    [name] Stewart is 35 y.o. female presenting with alcohol withdrawal, requesting alcohol detox. [facility], she started drinking again last weekend. Patient improved with Ativan injection will switch her over to by mouth and today as a pins. TTS consult pending.  Patient is medically cleared for psychiatric evaluation will be transferred to the psych ED. TTS consulted, home meds and psych standard holding orders placed.   TTS states that patient can chart to follow with her outpatient resources, patient's is still tachycardic in the 120s, I think should a outpatient Librium taper be effective for her. She will need inpatient alcohol detox.  Unassigned admission to internal medicine teaching service case discussed with resident Dr. Beckie Saltsivet who accepts admission.  [name] Emeryicole Norely Schlick,  PA-C 01/31/15 1256  Leta BaptistEmily Roe Nguyen, MD 02/03/15 [phone]

## 2015-01-31 NOTE — ED Notes (Signed)
Patient comes from home states she has been drinking ETOH since Friday. Patient [facility] and would like to do the same. Patient states she thinks she has had 6 bottles of wine and 2 bottles of vodka. Patient complains of headache.

## 2015-01-31 NOTE — ED Notes (Signed)
TTS in process 

## 2015-01-31 NOTE — ED Notes (Signed)
TTS at bedside. Patient resting denies any headache at this time.

## 2015-01-31 NOTE — ED Notes (Signed)
Report called to pod C.   

## 2015-01-31 NOTE — ED Notes (Signed)
Admitting team to bedside

## 2015-01-31 NOTE — H&P (Signed)
Date: 01/31/2015               Patient Name:  [name] Stewart MRN: [id number]  DOB: Jul 25, 1980 Age / Sex: 35 y.o., female   PCP: No primary care provider on file.         Medical Service: Internal Medicine Teaching Service         Attending Physician: Dr. Axel Filler, MD    First Contact: Dr. Loleta Chance Pager: [phone]  Second Contact: Dr. Albin Felling Pager: [phone]       After Hours (After 5p/  First Contact Pager: [phone]  weekends / holidays): Second Contact Pager: [phone]   Chief Complaint: Alcohol detox  History of Present Illness: [name] Stewart is a 35 year old female with five-year history of alcohol abuse, PTSD, bipolar disorder and OCD who presents for detoxification after her most recent relapse over the past five days. She began drinking at age 66 and reports that it has been a problem since that time. She reports that prior to Friday of last week, she had been sober for 30 days as part of an intensive outpatient rehab program at the [facility] mandated after she received a DUI charge. She began drinking again on Friday due to inability to cope with "a lot going on" and estimates that she has since consumed 6 bottles of wine and 2 fifths of vodka, with her last drink being last night. She presented voluntarily to the ED today for detox assistance as she has had problems with nausea, headache, shakiness, audiovisual hallucinations and confusion when relapsing in the past. She denies history of seizure or delirium tremens during withdrawal. She reports that she currently feels sweaty and nauseous, has a headache and feels confused, noting that the nurses were "trying to decorate me like a Christmas tree."    [name] Stewart has not been taking her prazosin, lurasidone or vilazodone for the past several days and notes that she stopped taking them when she began drinking. She also reports that she has not eaten in several days.   In the emergency room, [name] Stewart was tachycardic to 120,  normotensive at 121/88, and satting normally on room air. Her EKG was notable only for sinus tachycardia, no ischemic changes. Blood alcohol level was 236 mg/dL. Urine drug screen was negative for all substances. CBC was normal, AST was 44, ALT was 46. B-hCG was negative.   Of note, [name] Stewart reports that she has tried to detox unsuccessfully in both outpatient and inpatient programs in the past. After this admission, she plans to return to her intensive outpatient rehab program, which she says has worked well for her because it is also where she receives psychiatric care and therapy for her PTSD, bipolar disorder and OCD.     Meds: Current Facility-Administered Medications  Medication Dose Route Frequency Provider Last Rate Last Dose  . LORazepam (ATIVAN) tablet 0-4 mg  0-4 mg Oral 4 times per day Elmyra Ricks Pisciotta, PA-C   0 mg at 01/31/15 1150   Followed by  . [START ON 02/02/2015] LORazepam (ATIVAN) tablet 0-4 mg  0-4 mg Oral Q12H Nicole Pisciotta, PA-C      . [START ON 02/01/2015] lurasidone (LATUDA) tablet 40 mg  40 mg Oral Q breakfast Nicole Pisciotta, PA-C      . prazosin (MINIPRESS) capsule 4 mg  4 mg Oral QHS Nicole Pisciotta, PA-C      . valACYclovir (VALTREX) tablet 500 mg  500 mg Oral Daily PRN Elmyra Ricks  Pisciotta, PA-C       Current Outpatient Prescriptions  Medication Sig Dispense Refill  . lurasidone (LATUDA) 40 MG TABS tablet Take 1 tablet (40 mg total) by mouth daily with breakfast. (Patient taking differently: Take 80 mg by mouth daily with breakfast. ) 30 tablet 0  . multivitamin-iron-minerals-folic acid (CENTRUM) chewable tablet Chew 1 tablet by mouth daily.    . naltrexone (DEPADE) 50 MG tablet Take 25 mg by mouth daily.    . prazosin (MINIPRESS) 1 MG capsule Take 3 mg by mouth at bedtime.    . Vilazodone HCl (VIIBRYD) 20 MG TABS Take 1 tablet (20 mg total) by mouth daily. (Patient taking differently: Take 40 mg by mouth daily. ) 7 tablet 0  . prazosin (MINIPRESS) 2 MG capsule Take  2 capsules (4 mg total) by mouth at bedtime. (Patient not taking: Reported on 01/31/2015) 30 capsule 0  . traZODone (DESYREL) 50 MG tablet Take 1 tablet (50 mg total) by mouth at bedtime as needed for sleep. (Patient not taking: Reported on 01/31/2015) 30 tablet 0  . valACYclovir (VALTREX) 500 MG tablet Take 1 tablet (500 mg total) by mouth daily as needed (outbreaks). (Patient not taking: Reported on 01/31/2015) 30 tablet 0    Allergies: Allergies as of 01/31/2015 - Review Complete 01/31/2015  Allergen Reaction Noted  . Naltrexone Other (See Comments) 06/16/2014  . Baclofen Rash 06/16/2014   Past Medical History  Diagnosis Date  . Alcoholic ([place])   . Chronic back pain   . Anxiety   . Post traumatic stress disorder (PTSD)    Past Surgical History  Procedure Laterality Date  . Knee surgery Right    Family History  Problem Relation Age of Onset  . Anxiety disorder Mother   . Anxiety disorder Sister   . Anxiety disorder Maternal Grandmother   . Anxiety disorder Father   . Alcohol abuse Father   . Alcohol abuse Brother   . Alcohol abuse Paternal Uncle    Social History   Social History  . Marital Status: Single    Spouse Name: N/A  . Number of Children: N/A  . Years of Education: N/A   Occupational History  . Not on file.   Social History Main Topics  . Smoking status: Never Smoker   . Smokeless tobacco: Not on file  . 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  . Drug Use: No  . Sexual Activity: Not on file   Other Topics Concern  . Not on file   Social History Narrative   Reports she lives with a sober roommate.     Review of Systems: Constitutional: Positive for malaise and fatigue. Negative for fever, chills, weight loss.   HEENT: Negative for congestion, blurred vision/double vision.  Cardio: Positive for racing heartbeat. Pulm: Negative for cough, shortness of  breath. Abd: Negative for abdominal pain, constipation or diarrhea  MSK: Positive for myalgias.  Skin: Negative for itching, rash.  Neuro: Positive for headaches. Negative for dizziness, seizrues, LOC.  Psych: Positive for depression and substance abuse. Negative for hallucinations or suicidal ideation.    Physical Exam: Blood pressure 106/86, pulse 113, temperature 97.9 F (36.6 C), temperature source Oral, resp. rate 17, SpO2 97 %. General: Initially sleeping in bed with covers over face. Drowsy, well-nourished, no acute distress.  HEENT: normocephalic, atraumatic, EOMI, sclera clear Cardio: tachycardic with  regular rhythm, no murmurs, rubs or gallops Pulm: clear to auscultation bilaterally  Abd: soft, non-tender, non-distended, positive bowel sounds Ext: warm and well perfused, no evidence of pedal or pretibial edema  Lymph: no cervical or supraclavicular lymphadenopathy  Skin: no rashes  Neuro/Psych: initially drowsy, later more alert and able to better participate in history. Some confusion, saying "nurses decorated me like a Christmas tree"    Lab results: CBC w/ diff within normal limits.  CMP notable for CO2 21, Glucose 120, Calcium 8.6, anion gap 16. Liver function testing: total protein 7.6, albumin 4.0, AST 44, ALT 46, alk phos 27.  b-hCG negative.  Ethanol level: 236 mg/dL.  Urine rapid drug screen: negative for all substances.   Imaging results:  None.   Other results: EKG: sinus tachycardia.   Assessment & Plan by Problem: Active Problems:   Alcohol withdrawal ([place])  Ms. Feng is a 35 year old female with five year history of alcohol abuse who presents for detoxification assistance after her most recent relapse starting Friday. She was previously receiving intensive outpatient treatment at [facility] and plans to return to this program after discharge, as she believes it has been very helpful to her. She reports that she has withdrawn many times in the past and  has never experienced seizures or delirium tremens. During this admission, we will detox her with a Librium taper and CIWA scoring with Ativan as necessary.   Alcohol withdrawal: actively withdrawing - Librium 50 mg BID with plan to taper over about one week - CIWA assessment every 6 hours per protocol  - Lorazepam 1 mg PO q6 hours prn for CIWA >8 or withdrawal symptoms  - Cardiac monitoring and neuro checks q4 hours - Thiamine 165m PO once daily, multivitamin  - Continue naltrexone upon discharge   Alcohol use disorder: Per above. No evidence of cirrhosis on exam.  - Abdominal ultrasound  - HIV and HCV screen pending - INR pending   PTSD, OCD, bipolar disorder: patient receives psychiatric treatment at [facility] Despite being off her meds for the past several days, she does not appear to be manic and she denies suicidal ideation.   - Hold home trazodone during withdrawal as it may increase seizure threshold  - Continue home lurasidone 80 mg once daily  - Continue home vilazodone HCl 40 mg once daily  - Continue home prazosin 3 mg daily at bedtime   Anion gap metabolic acidosis: likely due to alcohol intoxication  - BMP tomorrow morning, will continue to monitor   FEN/GI: Fluids: D51/2NS @ 1028mhr for 12 hours  Electrolytes: normal, replete as necessary Nutrition: regular diet  DVT PPx: Lovenox 4055mQ daily  Dispo: Disposition is deferred at this time, awaiting improvement of current medical problems. Anticipated discharge pending resolution of withdrawal symptoms.    The patient does not have a current PCP (No primary care provider on file.) and [facility].  The patient does not have transportation limitations that hinder transportation to clinic appointments.  Signed: KatEverlene BallsS3[facility]   01/31/2015, 1:25 PM

## 2015-01-31 NOTE — Progress Notes (Signed)
Called ED, report received. 

## 2015-01-31 NOTE — H&P (Signed)
Date: 01/31/2015               Patient Name:  [name] Stewart MRN: [id number]  DOB: 13-Dec-1980 Age / Sex: 35 y.o., female   PCP: No primary care provider on file.         Medical Service: Internal Medicine Teaching Service         Attending Physician: Dr. Tyson Alias, MD    First Contact: Dr. [name] Pager: [phone]  Second Contact: Dr. Rich Number Pager: [phone]       After Hours (After 5p/  First Contact Pager: [phone]  weekends / holidays): Second Contact Pager: [phone]   Chief Complaint: "I relapsed."  History of Present Illness:  [name] Stewart is a 35 year old lady with a history of alcohol abuse, posttraumatic stress disorder, bipolar disorder, and obsessive-compulsive disorder presenting for alcohol detoxification.  She started drinking alcohol when she was 35 years old, and tells that she has essentially been addicted since that time. She has gone through withdrawals before, but has never had a seizure. For the last month, she has been in an intensive outpatient program at [facility]. She had been sober for 30 days until this Friday; she relapsed because she had "numerous stressors" but would not specifically tell us what they were. She drank about 8 bottles of wine since last Friday; her last drink was last night. Since that time, she has been tremulous, sweaty, and has had a headache. She also feels confused, like the nurses are "decorating her like a Christmas tree." Besides alcohol, she does not use any other drugs or tobacco. She has posttraumatic stress disorder from a "horrible marriage." Notably, she has not been taking her prazosin, lurasidone, or vilasodone for the last 3 days.  In the emergency department, she was tachycardic to 120, normotensive at 120/90, afebrile, saturating 100% on room air. Her alcohol level is 236, and a urine drug screen was otherwise nonrevealing. Basic labs were notable only for a mild anion gap metabolic acidosis of 16. Her liver  function tests were normal. Her EKG showed sinus tachycardia without ischemic changes.   Meds: Current Facility-Administered Medications  Medication Dose Route Frequency Provider Last Rate Last Dose  . LORazepam (ATIVAN) tablet 0-4 mg  0-4 mg Oral 4 times per day Joni Reining Pisciotta, PA-C   0 mg at 01/31/15 1150   Followed by  . [START ON 02/02/2015] LORazepam (ATIVAN) tablet 0-4 mg  0-4 mg Oral Q12H Nicole Pisciotta, PA-C      . [START ON 02/01/2015] lurasidone (LATUDA) tablet 40 mg  40 mg Oral Q breakfast Nicole Pisciotta, PA-C      . prazosin (MINIPRESS) capsule 4 mg  4 mg Oral QHS Nicole Pisciotta, PA-C      . valACYclovir (VALTREX) tablet 500 mg  500 mg Oral Daily PRN Wynetta Emery, PA-C       Current Outpatient Prescriptions  Medication Sig Dispense Refill  . lurasidone (LATUDA) 40 MG TABS tablet Take 1 tablet (40 mg total) by mouth daily with breakfast. (Patient taking differently: Take 80 mg by mouth daily with breakfast. ) 30 tablet 0  . multivitamin-iron-minerals-folic acid (CENTRUM) chewable tablet Chew 1 tablet by mouth daily.    . naltrexone (DEPADE) 50 MG tablet Take 25 mg by mouth daily.    . prazosin (MINIPRESS) 1 MG capsule Take 3 mg by mouth at bedtime.    . Vilazodone HCl (VIIBRYD) 20 MG TABS Take 1 tablet (20 mg total) by mouth daily. (  Patient taking differently: Take 40 mg by mouth daily. ) 7 tablet 0  . prazosin (MINIPRESS) 2 MG capsule Take 2 capsules (4 mg total) by mouth at bedtime. (Patient not taking: Reported on 01/31/2015) 30 capsule 0  . traZODone (DESYREL) 50 MG tablet Take 1 tablet (50 mg total) by mouth at bedtime as needed for sleep. (Patient not taking: Reported on 01/31/2015) 30 tablet 0  . valACYclovir (VALTREX) 500 MG tablet Take 1 tablet (500 mg total) by mouth daily as needed (outbreaks). (Patient not taking: Reported on 01/31/2015) 30 tablet 0    Allergies: Allergies as of 01/31/2015 - Review Complete 01/31/2015  Allergen Reaction Noted  . Naltrexone Other  (See Comments) 06/16/2014  . Baclofen Rash 06/16/2014   Past Medical History  Diagnosis Date  . Alcoholic (HCC)   . Chronic back pain   . Anxiety   . Post traumatic stress disorder (PTSD)    Past Surgical History  Procedure Laterality Date  . Knee surgery Right    No family history on file. Social History   Social History  . Marital Status: Single    Spouse Name: N/A  . Number of Children: N/A  . Years of Education: N/A   Occupational History  . Not on file.   Social History Main Topics  . Smoking status: Never Smoker   . Smokeless tobacco: Not on file  . 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  . Drug Use: No  . Sexual Activity: Not on file   Other Topics Concern  . Not on file   Social History Narrative   Review of Systems: Review of Systems  Constitutional: Positive for malaise/fatigue. Negative for fever, chills and weight loss.  HENT: Negative for congestion.   Eyes: Negative for blurred vision and double vision.  Respiratory: Negative for cough and shortness of breath.   Cardiovascular: Negative for chest pain, palpitations and leg swelling.  Gastrointestinal: Negative for abdominal pain and diarrhea.  Musculoskeletal: Positive for myalgias. Negative for falls.  Skin: Negative for itching and rash.  Neurological: Positive for headaches. Negative for dizziness, seizures and loss of consciousness.  Psychiatric/Behavioral: Positive for depression and substance abuse. Negative for suicidal ideas and hallucinations.    Physical Exam: Blood pressure 106/86, pulse 113, temperature 97.9 F (36.6 C), temperature source Oral, resp. rate 17, SpO2 97 %.  General: Diaphoretic young woman lying in bed with the covers over her head, but responding appropriately to her questions HEENT: no scleral icterus, extra-ocular muscles intact, oropharynx without lesions Cardiac:  Tachycardic but regular rhythm, no rubs, murmurs or gallops Pulm: breathing well, clear to auscultation bilaterally Abd: bowel sounds normal, soft, nondistended, non-tender, no evidence of ascites Ext: warm and well perfused, without pedal edema Lymph: no cervical or supraclavicular lymphadenopathy Skin: no rash, hair, no Terry's nails or telangiectasias Neuro: alert and oriented X3, cranial nerves II-XII grossly intact, moving all extremities well  Lab results: Basic Metabolic Panel:  Recent Labs  16/10/9599/04/17 0932  NA 141  K 3.9  CL 104  CO2 21*  GLUCOSE 120*  BUN 13  CREATININE 0.63  CALCIUM 8.6*   Liver Function Tests:  Recent Labs  01/31/15 0932  AST 44*  ALT 46  ALKPHOS 27*  BILITOT 0.9  PROT 7.6  ALBUMIN 4.0   CBC:  Recent Labs  01/31/15 0932  WBC 6.8  NEUTROABS  2.7  HGB 14.8  HCT 43.5  MCV 87.3  PLT 315   Urine Drug Screen: Drugs of Abuse     Component Value Date/Time   LABOPIA NONE DETECTED 01/31/2015 1000   COCAINSCRNUR NONE DETECTED 01/31/2015 1000   LABBENZ NONE DETECTED 01/31/2015 1000   AMPHETMU NONE DETECTED 01/31/2015 1000   THCU NONE DETECTED 01/31/2015 1000   LABBARB NONE DETECTED 01/31/2015 1000    Alcohol Level:  Recent Labs  01/31/15 0947  ETH 236*   Other results: XBJ:YNWGN tachycardia   Assessment & Plan by Problem:  Ms. Arita Miss is a 35 year old lady with a history of alcohol abuse, bipolar disorder, posttraumatic stress disorder, and obsessive-compulsive disorder presenting for alcohol detoxification. For the last month, she was in an intensive outpatient rehabilitation program at the [facility], but relapsed 3 days ago. Her last drink was last night. She does not have a history of seizures nor delirium tremens. This admission, we will detox her with scheduled Librium with monitored CIWA scores and Ativan as needed. Once she is adequately detoxed, she will return to her intensive outpatient rehabilitation program, which she  says has helped her tremendously up until this recent relapse.  Alcohol withdrawals: Per above. -Chlordiazepoxide 10 mg as needed four times daily  -Lorazepam 1 mg oral every 6 hours for CIWA greater than 8 -B9 and B1 vitamins -Continue naltrexone upon discharge -Seizure precautions  Alcohol use disorder: She does not have any evidence of cirrhosis on exam. We will check an abdominal ultrasound and HIV and HCV screening. -Abdominal ultrasound -HIV and HCV screens -Checking INR  PTSD, OCD, and bipolar disorder: She does not appear to be manic, and she denies any suicidal ideation. We will continue her home medications but some of these lower the seizure threshold, so he may stop them. She is seeing a psychiatrist at her intensive outpatient rehabilitation program.  -Continue lurasidone 40 mg daily -Continue prazosin 3mg  daily at bedtime -Continue vilazodone 40mg  daily  Anion gap metabolic acidosis: Likely from acute alcohol intoxication. -BMP tomorrow  Dispo: Disposition is deferred at this time, awaiting improvement of current medical problems. Anticipated discharge in approximately 2-3 day(s).   The patient does not have a current PCP (No primary care provider on file.) and does need an [facility] hospital follow-up appointment after discharge.  The patient does not have transportation limitations that hinder transportation to clinic appointments.  Signed: [name], MD 01/31/2015, 1:24 PM

## 2015-01-31 NOTE — ED Notes (Signed)
TTS  Machine at bedside,

## 2015-01-31 NOTE — Discharge Summary (Signed)
Name: [name] Stewart MRN: [id number] DOB: 02/26/1980 35 y.o. PCP: Verlon Auammy Lamonica Boyd, MD  Date of Admission: 01/31/2015  8:42 AM Date of Discharge: 02/01/2015 Attending Physician: Tyson Aliasuncan Thomas Vincent, MD  Discharge Diagnosis: 1. Alcohol withdrawal 2. Alcohol use disorder 3. PTSD, OCD, and bipolar disorder  Discharge Medications:   Medication List    STOP taking these medications        traZODone 50 MG tablet  Commonly known as:  DESYREL     valACYclovir 500 MG tablet  Commonly known as:  VALTREX      TAKE these medications        chlordiazePOXIDE 25 MG capsule  Commonly known as:  LIBRIUM  Take 2 capsules twice daily for 3 days, then 1 capsule twice daily for 3 days, then 1 capsule once daily for 3 days, then stop taking.     folic acid 1 MG tablet  Commonly known as:  FOLVITE  Take 1 tablet (1 mg total) by mouth daily.     lurasidone 40 MG Tabs tablet  Commonly known as:  LATUDA  Take 1 tablet (40 mg total) by mouth daily with breakfast.     multivitamin-iron-minerals-folic acid chewable tablet  Chew 1 tablet by mouth daily.     naltrexone 50 MG tablet  Commonly known as:  DEPADE  Take 25 mg by mouth daily.     prazosin 1 MG capsule  Commonly known as:  MINIPRESS  Take 3 mg by mouth at bedtime.     Vilazodone HCl 20 MG Tabs  Commonly known as:  VIIBRYD  Take 1 tablet (20 mg total) by mouth daily.        Disposition and follow-up:   [facility].  At the hospital follow up visit please address:  Resolution of her alcohol withdrawal with Librium taper  Follow-up Appointments: PCP appointment 1/7 IOP appointment 1/6  Procedures Performed:  [place] Abdomen Complete  01/31/2015  CLINICAL DATA:  Elevated liver enzymes EXAM: ABDOMEN ULTRASOUND COMPLETE COMPARISON:  None. FINDINGS: Gallbladder: No gallstones or wall thickening visualized. There is no pericholecystic fluid. No sonographic Murphy sign  noted by sonographer. Common bile duct: Diameter: 4 mm. There is no intrahepatic, common hepatic, or common bile duct dilatation. Liver: No focal lesion identified. Within normal limits in parenchymal echogenicity. IVC: No abnormality visualized. Pancreas: No mass or inflammatory focus. Spleen: Size and appearance within normal limits. Right Kidney: Length: 12.5 cm. Echogenicity within normal limits. No mass or hydronephrosis visualized. Left Kidney: Length: 12.8 cm. Echogenicity within normal limits. No mass or hydronephrosis visualized. Abdominal aorta: No aneurysm visualized. Other findings: No demonstrable ascites. IMPRESSION: Study within normal limits. Electronically Signed   By: Bretta BangWilliam  Woodruff III M.D.   On: 01/31/2015 16:15    Admission HPI:  [name] Stewart is a 35 year old lady with a history of alcohol abuse, posttraumatic stress disorder, bipolar disorder, and obsessive-compulsive disorder presenting for alcohol detoxification.  She started drinking alcohol when she was 35 years old, and tells that she has essentially been addicted since that time. She has gone through withdrawals before, but has never had a seizure. For the last month, she has been in an intensive outpatient program at [facility]. She had been sober for 30 days until this Friday; she relapsed because she had "numerous stressors" but would not specifically tell us what they were. She drank about 8 bottles of wine since last Friday; her last drink was  last night. Since that time, she has been tremulous, sweaty, and has had a headache. She also feels confused, like the nurses are "decorating her like a Christmas tree." Besides alcohol, she does not use any other drugs or tobacco. She has posttraumatic stress disorder from a "horrible marriage." Notably, she has not been taking her prazosin, lurasidone, or vilasodone for the last 3 days.  In the emergency department, she was tachycardic to 120, normotensive at 120/90, afebrile,  saturating 100% on room air. Her alcohol level is 236, and a urine drug screen was otherwise nonrevealing. Basic labs were notable only for a mild anion gap metabolic acidosis of 16. Her liver function tests were normal. Her EKG showed sinus tachycardia without ischemic changes.   Hospital Course by problem list:  Alcohol withdrawals: [name] came to the emergency department today for alcohol detoxification. For the last month, she had been in an intensive outpatient rehabilitation program. She started relapsing 4 days prior to by drinking 3 bottles of wine in 1-2 bottles of liquor per day. While inpatient, she mostly had anxiety and tremulousness; this was well managed on Librium 50 mg twice daily with oral Ativan as needed. She has never had a seizure while withdrawing. She was discharged with a 9 day taper of chlordiazepoxide and instructed to start taking folate and thiamine.  Alcohol use disorder: She does not have any evidence of cirrhosis on exam; this was confirmed by the absence of cirrhosis on abdominal ultrasound. HIV and hepatitis C screening were unremarkable. She has a great follow-up plan when she gets out of the hospital; she is currently in Alcoholics Anonymous, and intensive outpatient rehabilitation program where she sees a psychiatrist, and has a good relationship and appointment with her primary care doctor.  PTSD, OCD, and bipolar disorder: She did not appear to be manic, and she denied any suicidal ideation. We continued her home medications. She is seeing a psychiatrist at her intensive outpatient rehabilitation program.   Discharge Vitals:   BP 132/93 mmHg  Pulse 105  Temp(Src) 99.3 F (37.4 C) (Oral)  Resp 20  SpO2 97%  Discharge Labs:  Results for orders placed or performed during the hospital encounter of 01/31/15 (from the past 24 hour(s))  HIV antibody     Status: None   Collection Time: 01/31/15  5:53 PM  Result Value Ref Range   HIV Screen 4th Generation  wRfx Non Reactive Non Reactive  Hepatitis c antibody (reflex)     Status: None   Collection Time: 01/31/15  5:53 PM  Result Value Ref Range   HCV Ab <0.1 0.0 - 0.9 s/co ratio  Protime-INR     Status: None   Collection Time: 01/31/15  5:53 PM  Result Value Ref Range   Prothrombin Time 14.6 11.6 - 15.2 seconds   INR 1.12 0.00 - 1.49  HCV Comment:     Status: None   Collection Time: 01/31/15  5:53 PM  Result Value Ref Range   Comment: Comment   Comprehensive metabolic panel     Status: Abnormal   Collection Time: 02/01/15  4:01 AM  Result Value Ref Range   Sodium 136 135 - 145 mmol/L   Potassium 3.8 3.5 - 5.1 mmol/L   Chloride 107 101 - 111 mmol/L   CO2 21 (L) 22 - 32 mmol/L   Glucose, Bld 82 65 - 99 mg/dL   BUN 12 6 - 20 mg/dL   Creatinine, Ser 1.61 0.44 - 1.00 mg/dL   Calcium  8.5 (L) 8.9 - 10.3 mg/dL   Total Protein 6.2 (L) 6.5 - 8.1 g/dL   Albumin 3.0 (L) 3.5 - 5.0 g/dL   AST 55 (H) 15 - 41 U/L   ALT 47 14 - 54 U/L   Alkaline Phosphatase 22 (L) 38 - 126 U/L   Total Bilirubin 1.3 (H) 0.3 - 1.2 mg/dL   GFR calc non Af Amer >60 >60 mL/min   GFR calc Af Amer >60 >60 mL/min   Anion gap 8 5 - 15    Signed: [name], MD 02/01/2015, 1:24 PM

## 2015-01-31 NOTE — Progress Notes (Signed)
Patient arrived on unit via stretcher from ED. Telemetry placed per MD order & CMT notified.  
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
## 2015-02-01 DIAGNOSIS — F4310 Post-traumatic stress disorder, unspecified: Secondary | ICD-10-CM

## 2015-02-01 DIAGNOSIS — F10229 Alcohol dependence with intoxication, unspecified: Secondary | ICD-10-CM

## 2015-02-01 DIAGNOSIS — R319 Hematuria, unspecified: Secondary | ICD-10-CM

## 2015-02-01 DIAGNOSIS — E872 Acidosis: Secondary | ICD-10-CM

## 2015-02-01 DIAGNOSIS — F10239 Alcohol dependence with withdrawal, unspecified: Principal | ICD-10-CM

## 2015-02-01 DIAGNOSIS — F429 Obsessive-compulsive disorder, unspecified: Secondary | ICD-10-CM

## 2015-02-01 LAB — COMPREHENSIVE METABOLIC PANEL
ALBUMIN: 3 g/dL — AB (ref 3.5–5.0)
ALK PHOS: 22 U/L — AB (ref 38–126)
ALT: 47 U/L (ref 14–54)
ANION GAP: 8 (ref 5–15)
AST: 55 U/L — AB (ref 15–41)
BILIRUBIN TOTAL: 1.3 mg/dL — AB (ref 0.3–1.2)
BUN: 12 mg/dL (ref 6–20)
CHLORIDE: 107 mmol/L (ref 101–111)
CO2: 21 mmol/L — ABNORMAL LOW (ref 22–32)
Calcium: 8.5 mg/dL — ABNORMAL LOW (ref 8.9–10.3)
Creatinine, Ser: 0.48 mg/dL (ref 0.44–1.00)
Glucose, Bld: 82 mg/dL (ref 65–99)
Potassium: 3.8 mmol/L (ref 3.5–5.1)
SODIUM: 136 mmol/L (ref 135–145)
TOTAL PROTEIN: 6.2 g/dL — AB (ref 6.5–8.1)

## 2015-02-01 LAB — HEPATITIS C ANTIBODY (REFLEX)

## 2015-02-01 LAB — HCV COMMENT:

## 2015-02-01 LAB — HIV ANTIBODY (ROUTINE TESTING W REFLEX): HIV SCREEN 4TH GENERATION: NONREACTIVE

## 2015-02-01 MED ORDER — LURASIDONE HCL 80 MG PO TABS
80.0000 mg | ORAL_TABLET | ORAL | Status: DC
  Filled 2015-02-01: qty 1

## 2015-02-01 MED ORDER — FOLIC ACID 1 MG PO TABS: 1.0000 mg | ORAL_TABLET | Freq: Every day | ORAL | Status: DC

## 2015-02-01 MED ORDER — LORAZEPAM 2 MG/ML IJ SOLN
1.0000 mg | Freq: Once | INTRAMUSCULAR | Status: AC
  Administered 2015-02-01: 1 mg via INTRAVENOUS

## 2015-02-01 MED ORDER — CHLORDIAZEPOXIDE HCL 25 MG PO CAPS: ORAL_CAPSULE | ORAL | Status: DC

## 2015-02-01 NOTE — Progress Notes (Signed)
[name] LollingErin Stewart to be D/C'd Home per MD order.  Discussed prescriptions and follow up appointments with the patient. Prescriptions given to patient, medication list explained in detail. Pt verbalized understanding.    Medication List    STOP taking these medications        traZODone 50 MG tablet  Commonly known as:  DESYREL     valACYclovir 500 MG tablet  Commonly known as:  VALTREX      TAKE these medications        chlordiazePOXIDE 25 MG capsule  Commonly known as:  LIBRIUM  Take 2 capsules twice daily for 3 days, then 1 capsule twice daily for 3 days, then 1 capsule once daily for 3 days, then stop taking.     folic acid 1 MG tablet  Commonly known as:  FOLVITE  Take 1 tablet (1 mg total) by mouth daily.     lurasidone 40 MG Tabs tablet  Commonly known as:  LATUDA  Take 1 tablet (40 mg total) by mouth daily with breakfast.     multivitamin-iron-minerals-folic acid chewable tablet  Chew 1 tablet by mouth daily.     naltrexone 50 MG tablet  Commonly known as:  DEPADE  Take 25 mg by mouth daily.     prazosin 1 MG capsule  Commonly known as:  MINIPRESS  Take 3 mg by mouth at bedtime.     Vilazodone HCl 20 MG Tabs  Commonly known as:  VIIBRYD  Take 1 tablet (20 mg total) by mouth daily.        Filed Vitals:   02/01/15 0510 02/01/15 1001  BP: 109/69 109/71  Pulse: 72 91  Temp: 98.6 F (37 C) 98 F (36.7 C)  Resp: 16 17    Skin clean, dry and intact without evidence of skin break down, no evidence of skin tears noted. IV catheter discontinued intact. Site without signs and symptoms of complications. Dressing and pressure applied. Pt denies pain at this time. No complaints noted.  An After Visit Summary was printed and given to the patient. Patient escorted via WC, and D/C home via private auto.  [name] Stewart 02/01/2015 2:07 PM

## 2015-02-01 NOTE — Progress Notes (Signed)
Pt tachycardic, notified MD, no new orders, will administer Librium.

## 2015-02-01 NOTE — Progress Notes (Signed)
Subjective: Some tremulousness and anxiety overnight, for which she received 2 doses of 1mg  IV Ativan. Reports that she did not sleep well and is feeling very tired this morning, but is overall feeling better than yesterday and does not have nausea, tremors or hallucinations. Reports she is eating and drinking well. She is eager to be discharged so that she can return to her intensive outpatient rehabilitation program tomorrow morning.   Objective: Vital signs in last 24 hours: Filed Vitals:   01/31/15 1520 01/31/15 1713 01/31/15 2223 02/01/15 0510  BP: 118/80 132/93 117/77 109/69  Pulse: 101 105 85 72  Temp: 99.3 F (37.4 C)  99.1 F (37.3 C) 98.6 F (37 C)  TempSrc: Oral  Oral Oral  Resp: 20  16 16   SpO2: 97%  97% 99%    Intake/Output Summary (Last 24 hours) at 02/01/15 4098 Last data filed at 02/01/15 0600  Gross per 24 hour  Intake   1000 ml  Output      0 ml  Net   1000 ml   Exam: General: Tired appearing, but considerably more alert and interactive than yesterday. Less diaphoretic than yesterday. HEENT: EOMI, no scleral icterus Pulm: No increased work of breathing. Abd: Soft, non-distended. Ext: Warm, well perfused. Neuro: Alert and oriented x 3. Very mildly tremulous.  Psych: Anxious.   Lab Results: PT 14.6, INR 1.2 HCV Nonreactive HIV Nonreactive  Studies/Results: Abdominal Ultrasound 01/31/2015 within normal limits per radiology report.   Medications:  Scheduled Meds: . chlordiazePOXIDE  50 mg Oral BID  . enoxaparin (LOVENOX) injection  40 mg Subcutaneous Q24H  . folic acid  1 mg Oral Daily  . lurasidone  80 mg Oral Q24H  . multivitamin with minerals  1 tablet Oral Daily  . pneumococcal 23 valent vaccine  0.5 mL Intramuscular Tomorrow-1000  . prazosin  3 mg Oral QHS  . sodium chloride  3 mL Intravenous Q12H  . thiamine  100 mg Oral Daily  . Vilazodone HCl  40 mg Oral Daily   Continuous Infusions:  PRN Meds:.LORazepam **OR** [DISCONTINUED] LORazepam,  ondansetron **OR** ondansetron (ZOFRAN) IV Assessment/Plan: Principal Problem:   Alcohol withdrawal (HCC) Active Problems:   Metabolic acidosis   Alcohol abuse   Alcohol use disorder, severe, dependence (HCC)   PTSD (post-traumatic stress disorder)   Bipolar disorder with severe depression (HCC)  Ms. Slay is a 35 year old female with five year history of alcohol abuse who presents for detoxification assistance after her most recent relapse starting last Friday. She appears improved this morning and is eager to be discharged to complete Librium taper at home. She was previously receiving intensive outpatient treatment at [facility] and plans to return to this program after discharge, as she believes it has been very helpful for her. Pending reevaluation this afternoon, we will plan to discharge her later today.   Alcohol withdrawal: actively withdrawing, CIWA scores of 11-14 overnight - Librium 50 mg BID with plan to taper over next 1-2 weeks - CIWA assessment every 6 hours per protocol  - Lorazepam 1 mg PO q6 hours prn for CIWA >8 or withdrawal symptoms  - Cardiac monitoring and neuro checks q4 hours - Thiamine 100mg  PO once daily, multivitamin  - Continue naltrexone upon discharge   Alcohol use disorder: Per above. No evidence of cirrhosis on exam, labs or imaging.  - Plan for return to intensive outpatient rehab upon discharge.   PTSD, OCD, bipolar disorder: patient receives psychiatric treatment at [facility]. Despite  being off her meds for the past several days, she does not appear to be manic and she denies suicidal ideation.  - Hold home trazodone during withdrawal as it may increase seizure threshold  - Continue home lurasidone 80 mg once daily  - Continue home vilazodone HCl 40 mg once daily  - Continue home prazosin 3 mg daily at bedtime   Anion gap metabolic acidosis: Resolved.   FEN/GI: Fluids: None Electrolytes: normal, replete as necessary Nutrition:  regular diet  DVT PPx: Lovenox 40mg  SQ daily  Dispo: Likely discharge later today with intensive outpatient rehabilitation follow up at the [facility].   The patient does have a current PCP (Tammy Eartha InchLamonica Boyd, MD) and does not need an [facility] hospital follow-up appointment after discharge.  The patient does not have transportation limitations that hinder transportation to clinic appointments.  .Services Needed at time of discharge: Y = Yes, Blank = No PT:   OT:   RN:   Equipment:   Other:     LOS: 1 day   Marylou FlesherKatherine Joelyn Lover, MS3 [facility] [employer]   02/01/2015, 8:52 AM

## 2015-02-01 NOTE — Progress Notes (Signed)
   02/01/15 1400  Clinical Encounter Type  Visited With Patient  Visit Type Spiritual support  Referral From Nurse  Spiritual Encounters  Spiritual Needs Emotional  Stress Factors  Patient Stress Factors Financial concerns;Other (Comment) (Multiple mental health diagnoses)  Patient was leaving as I arrived but said she'd still like to speak to me. Pt checked herself in for detox, fear of seizures. Has recently lost job and said that she suffers from bipolar, anxiety, and PTSD. She was anxious to get home and feeling hopeful because this slip lasted a very short time before she realized she needed to stop, and she withdrew at home and drove herself in to ED. We discussed AA vs church, the latter of which she finds more helpful in staying sober.

## 2015-02-01 NOTE — Progress Notes (Signed)
Patient ID: [name] Stewart, female   DOB: 06/12/1980, 35 y.o.   MRN: [id number]   Subjective: [name] Stewart have a hard time sleeping last night as she was quite anxious and tremulous. She got 2 doses of 1 mg IV Ativan. She tells us she is feeling better today, and would like to go home. She has an outpatient rehabilitation program tomorrow at 9 AM she would like to make. We told her we would check on her this afternoon. We also explained the Librium taper, and she understood what to do.  Objective: Vital signs in last 24 hours: Filed Vitals:   01/31/15 1713 01/31/15 2223 02/01/15 0510 02/01/15 1001  BP: 132/93 117/77 109/69 109/71  Pulse: 105 85 72 91  Temp:  99.1 F (37.3 C) 98.6 F (37 C) 98 F (36.7 C)  TempSrc:  Oral Oral Oral  Resp:  16 16 17   SpO2:  97% 99% 99%   Physical Exam: General: young woman lying in bed, less diaphoretic and tremulous compared to yesterday, responding appropriately to her questions HEENT: no scleral icterus, extra-ocular muscles intact, Cardiac: Regular rate and rhythm no rubs, murmurs or gallops Pulm: breathing well, clear to auscultation bilaterally Abd: bowel sounds normal, soft, nondistended, non-tender, no evidence of ascites Ext: warm and well perfused, without pedal edema Lymph: no cervical or supraclavicular lymphadenopathy Skin: no rash, hair, no Terry's nails or telangiectasias Neuro: alert and oriented X3, cranial nerves II-XII grossly intact, moving all extremities well  Lab Results: Basic Metabolic Panel:  Recent Labs Lab 01/31/15 0932 02/01/15 0401  NA 141 136  K 3.9 3.8  CL 104 107  CO2 21* 21*  GLUCOSE 120* 82  BUN 13 12  CREATININE 0.63 0.48  CALCIUM 8.6* 8.5*   Liver Function Tests:  Recent Labs Lab 01/31/15 0932 02/01/15 0401  AST 44* 55*  ALT 46 47  ALKPHOS 27* 22*  BILITOT 0.9 1.3*  PROT 7.6 6.2*  ALBUMIN 4.0 3.0*   Coagulation:  Recent Labs Lab 01/31/15 1753  LABPROT 14.6  INR 1.12   Studies/Results: [place]  Abdomen Complete  01/31/2015  CLINICAL DATA:  Elevated liver enzymes EXAM: ABDOMEN ULTRASOUND COMPLETE COMPARISON:  None. FINDINGS: Gallbladder: No gallstones or wall thickening visualized. There is no pericholecystic fluid. No sonographic Murphy sign noted by sonographer. Common bile duct: Diameter: 4 mm. There is no intrahepatic, common hepatic, or common bile duct dilatation. Liver: No focal lesion identified. Within normal limits in parenchymal echogenicity. IVC: No abnormality visualized. Pancreas: No mass or inflammatory focus. Spleen: Size and appearance within normal limits. Right Kidney: Length: 12.5 cm. Echogenicity within normal limits. No mass or hydronephrosis visualized. Left Kidney: Length: 12.8 cm. Echogenicity within normal limits. No mass or hydronephrosis visualized. Abdominal aorta: No aneurysm visualized. Other findings: No demonstrable ascites. IMPRESSION: Study within normal limits. Electronically Signed   By: Bretta BangWilliam  Woodruff III M.D.   On: 01/31/2015 16:15   Medications: I have reviewed the patient's current medications. Scheduled Meds: . chlordiazePOXIDE  50 mg Oral BID  . enoxaparin (LOVENOX) injection  40 mg Subcutaneous Q24H  . folic acid  1 mg Oral Daily  . lurasidone  80 mg Oral Q24H  . multivitamin with minerals  1 tablet Oral Daily  . pneumococcal 23 valent vaccine  0.5 mL Intramuscular Tomorrow-1000  . prazosin  3 mg Oral QHS  . sodium chloride  3 mL Intravenous Q12H  . thiamine  100 mg Oral Daily  . Vilazodone HCl  40 mg Oral Daily  Continuous Infusions:  PRN Meds:.LORazepam **OR** [DISCONTINUED] LORazepam, ondansetron **OR** ondansetron (ZOFRAN) IV   Assessment/Plan:  [name] [name] Stewart is a 35 year old lady here for alcohol withdrawals. Her last drink was 2 days ago; she is slightly tremulous and anxious, but feels she is ready to go home, she would like to make her outpatient rehabilitation program tomorrow at 9 AM. I think she is safe for discharge as she has  never had any seizures and has gone through withdrawal several times. She is artery plugged into an outpatient rehabilitation program which is been helping her tremendously. We will check on her this afternoon; if she continues to do well, we will send her home on a 9 day Librium taper.  Alcohol withdrawals: Per above. -Chlordiazepoxide 50 mg twice daily -Lorazepam 1 mg oral every 6 hours for CIWA greater than 8 -B9 and B1 vitamins -Continue naltrexone upon discharge -Seizure precautions  Alcohol use disorder: She does not have any cirrhotic stigmata and her abdominal ultrasound did not show any cirrhosis. Her HIV and hepatitis C titers were unremarkable, and her INR was normal.  PTSD, OCD, and bipolar disorder: She does not appear to be manic, and she denies any suicidal ideation. We will continue her home medications. She is seeing a psychiatrist at her intensive outpatient rehabilitation program.  -Continue lurasidone 40 mg daily -Continue prazosin 3mg  daily at bedtime -Continue vilazodone 40mg  daily  Anion gap metabolic acidosis: Likely from acute alcohol intoxication; this has resolved this morning.  Dispo: Discharged today if she is feeling well this afternoon.  The patient does have a current PCP (Tammy Eartha Inch, MD) and does need an [facility] hospital follow-up appointment after discharge.  The patient does not know have transportation limitations that hinder transportation to clinic appointments.  .Services Needed at time of discharge: Y = Yes, Blank = No PT:   OT:   RN:   Equipment:   Other:     LOS: 1 day   [name], MD 02/01/2015, 11:15 AM

## 2015-02-03 ENCOUNTER — Emergency Department (HOSPITAL_COMMUNITY)
Admission: EM | Admit: 2015-02-03 | Discharge: 2015-02-05 | Disposition: A | Discharge status: Other Acute Inpatient Hospital | Payer: Federal, State, Local not specified - PPO | Attending: Emergency Medicine | Admitting: Emergency Medicine

## 2015-02-03 ENCOUNTER — Encounter (HOSPITAL_COMMUNITY): Payer: Self-pay | Admitting: Nurse Practitioner

## 2015-02-03 DIAGNOSIS — F1020 Alcohol dependence, uncomplicated: Secondary | ICD-10-CM | POA: Diagnosis present

## 2015-02-03 DIAGNOSIS — F32A Depression, unspecified: Secondary | ICD-10-CM

## 2015-02-03 DIAGNOSIS — F419 Anxiety disorder, unspecified: Secondary | ICD-10-CM | POA: Insufficient documentation

## 2015-02-03 DIAGNOSIS — F1310 Sedative, hypnotic or anxiolytic abuse, uncomplicated: Secondary | ICD-10-CM | POA: Insufficient documentation

## 2015-02-03 DIAGNOSIS — Z79899 Other long term (current) drug therapy: Secondary | ICD-10-CM | POA: Diagnosis not present

## 2015-02-03 DIAGNOSIS — F314 Bipolar disorder, current episode depressed, severe, without psychotic features: Secondary | ICD-10-CM | POA: Diagnosis not present

## 2015-02-03 DIAGNOSIS — F1010 Alcohol abuse, uncomplicated: Secondary | ICD-10-CM | POA: Diagnosis present

## 2015-02-03 DIAGNOSIS — T1491 Suicide attempt: Secondary | ICD-10-CM | POA: Diagnosis present

## 2015-02-03 DIAGNOSIS — R45851 Suicidal ideations: Secondary | ICD-10-CM | POA: Diagnosis not present

## 2015-02-03 DIAGNOSIS — F4310 Post-traumatic stress disorder, unspecified: Secondary | ICD-10-CM | POA: Diagnosis not present

## 2015-02-03 DIAGNOSIS — F329 Major depressive disorder, single episode, unspecified: Secondary | ICD-10-CM

## 2015-02-03 LAB — COMPREHENSIVE METABOLIC PANEL
ALBUMIN: 4.1 g/dL (ref 3.5–5.0)
ALT: 32 U/L (ref 14–54)
ANION GAP: 11 (ref 5–15)
AST: 30 U/L (ref 15–41)
Alkaline Phosphatase: 47 U/L (ref 38–126)
BUN: 21 mg/dL — AB (ref 6–20)
CHLORIDE: 111 mmol/L (ref 101–111)
CO2: 22 mmol/L (ref 22–32)
Calcium: 8.8 mg/dL — ABNORMAL LOW (ref 8.9–10.3)
Creatinine, Ser: 0.55 mg/dL (ref 0.44–1.00)
GFR calc Af Amer: >60 mL/min (ref >60)
GFR calc non Af Amer: >60 mL/min (ref >60)
GLUCOSE: 102 mg/dL — AB (ref 65–99)
POTASSIUM: 4 mmol/L (ref 3.5–5.1)
SODIUM: 144 mmol/L (ref 135–145)
TOTAL PROTEIN: 7.8 g/dL (ref 6.5–8.1)
Total Bilirubin: 0.3 mg/dL (ref 0.3–1.2)

## 2015-02-03 LAB — CBC WITH DIFFERENTIAL/PLATELET
BASOS ABS: 0 10*3/uL (ref 0.0–0.1)
BASOS PCT: 1 %
Eosinophils Absolute: 0.1 10*3/uL (ref 0.0–0.7)
Eosinophils Relative: 1 %
HEMATOCRIT: 41.3 % (ref 36.0–46.0)
HEMOGLOBIN: 13.5 g/dL (ref 12.0–15.0)
LYMPHS PCT: 62 %
Lymphs Abs: 4.4 10*3/uL — ABNORMAL HIGH (ref 0.7–4.0)
MCH: 30.1 pg (ref 26.0–34.0)
MCHC: 32.7 g/dL (ref 30.0–36.0)
MCV: 92 fL (ref 78.0–100.0)
MONO ABS: 0.4 10*3/uL (ref 0.1–1.0)
MONOS PCT: 5 %
NEUTROS ABS: 2.1 10*3/uL (ref 1.7–7.7)
NEUTROS PCT: 31 %
Platelets: 233 10*3/uL (ref 150–400)
RBC: 4.49 MIL/uL (ref 3.87–5.11)
RDW: 13.1 % (ref 11.5–15.5)
WBC: 7 10*3/uL (ref 4.0–10.5)

## 2015-02-03 LAB — ETHANOL: ALCOHOL ETHYL (B): 300 mg/dL — AB (ref <5)

## 2015-02-03 LAB — RAPID URINE DRUG SCREEN, HOSP PERFORMED
Amphetamines: NOT DETECTED
BARBITURATES: NOT DETECTED
BENZODIAZEPINES: POSITIVE — AB
COCAINE: NOT DETECTED
Opiates: NOT DETECTED
TETRAHYDROCANNABINOL: NOT DETECTED

## 2015-02-03 MED ORDER — ZOLPIDEM TARTRATE 5 MG PO TABS
5.0000 mg | ORAL_TABLET | Freq: Every evening | ORAL | Status: DC | PRN
  Administered 2015-02-04: 5 mg via ORAL
  Filled 2015-02-03: qty 1

## 2015-02-03 MED ORDER — LORAZEPAM 1 MG PO TABS
1.0000 mg | ORAL_TABLET | Freq: Three times a day (TID) | ORAL | Status: DC | PRN
  Administered 2015-02-03 – 2015-02-04 (×2): 1 mg via ORAL
  Filled 2015-02-03 (×2): qty 1

## 2015-02-03 MED ORDER — LEVONORGEST-ETH ESTRAD 91-DAY 0.15-0.03 &0.01 MG PO TABS: 1.0000 | ORAL_TABLET | Freq: Every day | ORAL | Status: DC

## 2015-02-03 MED ORDER — ACETAMINOPHEN 325 MG PO TABS: 650.0000 mg | ORAL_TABLET | ORAL | Status: DC | PRN

## 2015-02-03 MED ORDER — ONDANSETRON HCL 4 MG PO TABS
4.0000 mg | ORAL_TABLET | Freq: Three times a day (TID) | ORAL | Status: DC | PRN
Start: 2015-02-03 — End: 2015-02-04

## 2015-02-03 MED ORDER — ALUM & MAG HYDROXIDE-SIMETH 200-200-20 MG/5ML PO SUSP
30.0000 mL | ORAL | Status: DC | PRN
  Administered 2015-02-05: 30 mL via ORAL
  Filled 2015-02-03: qty 30

## 2015-02-03 NOTE — BH Assessment (Signed)
Assessment completed. Consulted Hulan FessIjeoma Nwaeze, NP who recommended inpatient treatment. TTS to seek placement. Informed Cheron SchaumannLeslie Sofia, PA-C of recommendation.
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
## 2015-02-03 NOTE — ED Notes (Signed)
Patient pretended to go to restroom. Patient then attempted to walk out triage doors. This Clinical research associatewriter redirected patient to room. Patient the tried to go out of doors by triage 8. This Clinical research associatewriter redirected patient to triage 5.

## 2015-02-03 NOTE — ED Notes (Signed)
Sitter case called in.

## 2015-02-03 NOTE — ED Notes (Signed)
Pt is presented by [employer], family IVC'd pt secondary to a suicide attempt by using a gun (pt is denying at this time, states she was "trying to change the location of her gun" ). Obvious etoh intoxication, to which pt endorses taking 1/4 vodka. Pt is calm and cooperative at this time.

## 2015-02-03 NOTE — ED Provider Notes (Signed)
CSN: [id number]     Arrival date & time 02/03/15  2006 History   First MD Initiated Contact with Patient 02/03/15 2030     Chief Complaint  Patient presents with  . SA   . IVC'd      (Consider location/radiation/quality/duration/timing/severity/associated sxs/prior Treatment) The history is provided by the patient. No language interpreter was used.  Pt here with police.  Pt has involuntary commitment papers.  Pt called suicide hotline and called a police officer who was a friend.  Police report patient was barricaded in a bathroom with a loaded gun.  Pt is currently taking librium for detox.  Pt admits to drinking alcohol.  Pt reports she is tapering herself off.  Pt reports she was not planning on harming herself.  Pt reports she just wanted police to show her how to unload a gun.    Past Medical History  Diagnosis Date  . Alcoholic (HCC)   . Anxiety   . Post traumatic stress disorder (PTSD)   . Alcohol related seizure (HCC)   . Bipolar disorder (HCC)   . Tension headache, chronic     "monthly" (01/31/2015)  . Depression    Past Surgical History  Procedure Laterality Date  . Osteochondroma excision Right ~ 1996    "knee"  . Excisional hemorrhoidectomy  20111  . Refractive surgery Bilateral ~ 2011   Family History  Problem Relation Age of Onset  . Anxiety disorder Mother   . Anxiety disorder Sister   . Anxiety disorder Maternal Grandmother   . Anxiety disorder Father   . Alcohol abuse Father   . Alcohol abuse Brother   . Alcohol abuse Paternal Uncle    Social History  Substance Use Topics  . Smoking status: Never Smoker   . Smokeless tobacco: Never Used  . Alcohol Use: 162.6 oz/week    158 Glasses of wine, 113 Shots of liquor per week     Comment: 01/31/2015 "4 bottles of wine & 1 fifth of vodka daily"   OB History    No data available     Review of Systems  All other systems reviewed and are negative.     Allergies  Naltrexone and Baclofen  Home Medications    Prior to Admission medications   Medication Sig Start Date End Date Taking? Authorizing Provider  chlordiazePOXIDE (LIBRIUM) 25 MG capsule Take 2 capsules twice daily for 3 days, then 1 capsule twice daily for 3 days, then 1 capsule once daily for 3 days, then stop taking. 02/01/15   Selina CooleyKyle Flores, MD  folic acid (FOLVITE) 1 MG tablet Take 1 tablet (1 mg total) by mouth daily. 02/01/15   Selina CooleyKyle Flores, MD  lurasidone (LATUDA) 40 MG TABS tablet Take 1 tablet (40 mg total) by mouth daily with breakfast. Patient taking differently: Take 80 mg by mouth daily with breakfast.  12/17/14   Oneta Rackanika N Lewis, NP  multivitamin-iron-minerals-folic acid (CENTRUM) chewable tablet Chew 1 tablet by mouth daily.    Historical Provider, MD  naltrexone (DEPADE) 50 MG tablet Take 25 mg by mouth daily.    Historical Provider, MD  prazosin (MINIPRESS) 1 MG capsule Take 3 mg by mouth at bedtime.    Historical Provider, MD  Vilazodone HCl (VIIBRYD) 20 MG TABS Take 1 tablet (20 mg total) by mouth daily. Patient taking differently: Take 40 mg by mouth daily.  12/17/14   Oneta Rackanika N Lewis, NP   BP 127/87 mmHg  Pulse 104  Temp(Src) 98.2 F (36.8 C) (Oral)  Resp 16  SpO2 98% Physical Exam  Constitutional: She appears well-developed and well-nourished.  HENT:  Head: Normocephalic and atraumatic.  Eyes: Conjunctivae and EOM are normal. Pupils are equal, round, and reactive to light.  Neck: Normal range of motion. Neck supple.  Cardiovascular: Normal rate, regular rhythm and normal heart sounds.   Pulmonary/Chest: Effort normal and breath sounds normal.  Abdominal: Soft.  Musculoskeletal: Normal range of motion.  Neurological: She is alert.  Skin: Skin is warm.  Psychiatric: She has a normal mood and affect.  Nursing note and vitals reviewed.   ED Course  Procedures (including critical care time) Labs Review Labs Reviewed  COMPREHENSIVE METABOLIC PANEL - Abnormal; Notable for the following:    Glucose, Bld 102 (*)     BUN 21 (*)    Calcium 8.8 (*)    All other components within normal limits  ETHANOL - Abnormal; Notable for the following:    Alcohol, Ethyl (B) 300 (*)    All other components within normal limits  CBC WITH DIFFERENTIAL/PLATELET - Abnormal; Notable for the following:    Lymphs Abs 4.4 (*)    All other components within normal limits  URINE RAPID DRUG SCREEN, HOSP PERFORMED - Abnormal; Notable for the following:    Benzodiazepines POSITIVE (*)    All other components within normal limits    Imaging Review No results found. I have personally reviewed and evaluated these images and lab results as part of my medical decision-making.   EKG Interpretation None      MDM   Final diagnoses:  Depression  Alcohol abuse    Pt seen by TTS.  Pt to be admitted for inpatient treatment.     Lonia Skinner [place], PA-C 02/03/15 2307  Loren Racer, MD 02/04/15 [id number]

## 2015-02-03 NOTE — ED Notes (Signed)
Pt. To [facility] from ED ambulatory without difficulty, to room 37 . [facility]. Pt. Is alert and oriented, warm and dry in no distress. Pt. Denies SI, HI, and AVH. Pt. Upset, with cursing demanding her "night medications". Informed patient that EDP would order her meds. Pt. Made aware of security cameras and Q15 minute rounds. Pt. Encouraged to let Nursing staff know of any concerns or needs.

## 2015-02-03 NOTE — ED Notes (Addendum)
Patient belongings stored in a patient belongings bag which include one cell phone, purse, winter coat, tennis shoes, pink top, pair of socks, and pair of pants.

## 2015-02-04 DIAGNOSIS — R45851 Suicidal ideations: Secondary | ICD-10-CM

## 2015-02-04 DIAGNOSIS — F314 Bipolar disorder, current episode depressed, severe, without psychotic features: Secondary | ICD-10-CM | POA: Diagnosis not present

## 2015-02-04 MED ORDER — CHLORDIAZEPOXIDE HCL 25 MG PO CAPS: 25.0000 mg | ORAL_CAPSULE | Freq: Every day | ORAL | Status: DC

## 2015-02-04 MED ORDER — HYDROXYZINE HCL 25 MG PO TABS
25.0000 mg | ORAL_TABLET | Freq: Four times a day (QID) | ORAL | Status: DC | PRN
  Administered 2015-02-04 – 2015-02-05 (×2): 25 mg via ORAL
  Filled 2015-02-04 (×2): qty 1

## 2015-02-04 MED ORDER — THIAMINE HCL 100 MG/ML IJ SOLN: 100.0000 mg | Freq: Once | INTRAMUSCULAR | Status: DC

## 2015-02-04 MED ORDER — LOPERAMIDE HCL 2 MG PO CAPS: 2.0000 mg | ORAL_CAPSULE | ORAL | Status: DC | PRN

## 2015-02-04 MED ORDER — IBUPROFEN 200 MG PO TABS
600.0000 mg | ORAL_TABLET | Freq: Three times a day (TID) | ORAL | Status: DC | PRN
  Administered 2015-02-04 – 2015-02-05 (×2): 600 mg via ORAL
  Filled 2015-02-04 (×2): qty 3

## 2015-02-04 MED ORDER — CHLORDIAZEPOXIDE HCL 25 MG PO CAPS: 25.0000 mg | ORAL_CAPSULE | ORAL | Status: DC

## 2015-02-04 MED ORDER — ONDANSETRON 4 MG PO TBDP
4.0000 mg | ORAL_TABLET | Freq: Four times a day (QID) | ORAL | Status: DC | PRN
Start: 2015-02-04 — End: 2015-02-05
  Administered 2015-02-04: 4 mg via ORAL
  Filled 2015-02-04: qty 1

## 2015-02-04 MED ORDER — PRAZOSIN HCL 2 MG PO CAPS
3.0000 mg | ORAL_CAPSULE | Freq: Every day | ORAL | Status: DC
  Administered 2015-02-04: 3 mg via ORAL
  Filled 2015-02-04 (×2): qty 1

## 2015-02-04 MED ORDER — LURASIDONE HCL 80 MG PO TABS
80.0000 mg | ORAL_TABLET | Freq: Every day | ORAL | Status: DC
  Filled 2015-02-04 (×2): qty 1

## 2015-02-04 MED ORDER — CHLORDIAZEPOXIDE HCL 25 MG PO CAPS
25.0000 mg | ORAL_CAPSULE | Freq: Three times a day (TID) | ORAL | Status: DC
  Administered 2015-02-05 (×2): 25 mg via ORAL
  Filled 2015-02-04 (×2): qty 1

## 2015-02-04 MED ORDER — LURASIDONE HCL 80 MG PO TABS
80.0000 mg | ORAL_TABLET | Freq: Every day | ORAL | Status: DC
  Administered 2015-02-04: 80 mg via ORAL
  Filled 2015-02-04 (×2): qty 1

## 2015-02-04 MED ORDER — VITAMIN B-1 100 MG PO TABS
100.0000 mg | ORAL_TABLET | Freq: Every day | ORAL | Status: DC
  Administered 2015-02-05: 100 mg via ORAL
  Filled 2015-02-04: qty 1

## 2015-02-04 MED ORDER — VILAZODONE HCL 20 MG PO TABS
40.0000 mg | ORAL_TABLET | Freq: Every day | ORAL | Status: DC
  Administered 2015-02-04 – 2015-02-05 (×2): 40 mg via ORAL
  Filled 2015-02-04 (×2): qty 2

## 2015-02-04 MED ORDER — LURASIDONE HCL 80 MG PO TABS
80.0000 mg | ORAL_TABLET | Freq: Every day | ORAL | Status: DC
  Filled 2015-02-04: qty 1

## 2015-02-04 MED ORDER — ADULT MULTIVITAMIN W/MINERALS CH
1.0000 | ORAL_TABLET | Freq: Every day | ORAL | Status: DC
  Administered 2015-02-04 – 2015-02-05 (×2): 1 via ORAL
  Filled 2015-02-04 (×2): qty 1

## 2015-02-04 MED ORDER — CHLORDIAZEPOXIDE HCL 25 MG PO CAPS
25.0000 mg | ORAL_CAPSULE | Freq: Four times a day (QID) | ORAL | Status: AC
  Administered 2015-02-04 (×3): 25 mg via ORAL
  Filled 2015-02-04 (×3): qty 1

## 2015-02-04 MED ORDER — VALACYCLOVIR HCL 500 MG PO TABS
500.0000 mg | ORAL_TABLET | Freq: Two times a day (BID) | ORAL | Status: DC | PRN
  Filled 2015-02-04: qty 1

## 2015-02-04 MED ORDER — CHLORDIAZEPOXIDE HCL 25 MG PO CAPS
25.0000 mg | ORAL_CAPSULE | Freq: Four times a day (QID) | ORAL | Status: DC | PRN
  Administered 2015-02-05: 25 mg via ORAL
  Filled 2015-02-04: qty 1

## 2015-02-04 NOTE — ED Notes (Signed)
Pt. Noted sleeping in room. No complaints or concerns voiced. No distress or abnormal behavior noted. Will continue to monitor with security cameras. Q 15 minute rounds continue. 

## 2015-02-04 NOTE — ED Notes (Addendum)
Reports that she i still anxious and shakey, but they have improved, eating snack

## 2015-02-04 NOTE — ED Notes (Signed)
Pt. Noted in room. No complaints or concerns voiced. No distress or abnormal behavior noted. Will continue to monitor with security cameras. Q 15 minute rounds continue. 

## 2015-02-04 NOTE — ED Notes (Signed)
Pt. Noted in hall. Pt. Requesting her home meds with Latuda in the PM. No distress or abnormal behavior noted. Will continue to monitor with security cameras. Q 15 minute rounds continue.

## 2015-02-04 NOTE — Progress Notes (Signed)
Disposition CSW received telephone contact from [facility] stating the patient's BCBS policy has expired, thus she is a [employer] Self-[employer] and has been put on their waiting list.  CSW completed additional referrals for patient to the following inpatient psych facilities:  [facility]  CSW will follow patient as needed for placement.  Seward SpeckLeo Jakeel Starliper [facility]illoCSW,LCAS Behavioral Health Disposition CSW [phone]

## 2015-02-04 NOTE — ED Notes (Signed)
Pt. C/o nausea. 

## 2015-02-04 NOTE — ED Notes (Signed)
Pt up in hall/day room.  Pt calm, sad, reports that she is glad that she is here because she realizes that she  Has been depressed and drinking again.  Pt also reports that she knows that she should not have loaded gun at the house. Pt also reports that her roommate did not know that she is drinking again.

## 2015-02-04 NOTE — ED Notes (Signed)
On the phone 

## 2015-02-04 NOTE — ED Notes (Signed)
Up in hall walking and talking w/ other pt

## 2015-02-04 NOTE — ED Notes (Signed)
Pt sleeping soundle

## 2015-02-04 NOTE — ED Notes (Signed)
Report received from Janie Rambo RN. Pt. Sleeping, respirations regular and unlabored. Will continue to monitor for safety via security cameras and Q 15 minute checks. 

## 2015-02-04 NOTE — ED Notes (Signed)
Up to the bathroom 

## 2015-02-04 NOTE — ED Notes (Signed)
Pt has been accepted to [facility] wait list

## 2015-02-04 NOTE — Consult Note (Signed)
[place] Psychiatry Consult   Reason for Consult:  Suicide attempt Referring Physician:  EDP Patient Identification: [name] Stewart MRN:  [id number] Principal Diagnosis: Bipolar disorder with severe depression ([place]) Diagnosis:   Patient Active Problem List   Diagnosis Date Noted  . Bipolar disorder with severe depression ([place]) [F31.4] 12/14/2014    Priority: High  . Alcohol use disorder, severe, dependence ([place]) [F10.20] 12/13/2014    Priority: High  . Alcohol abuse [F10.10] 01/01/2014    Priority: High  . PTSD (post-traumatic stress disorder) [F43.10] 12/14/2014  . Metabolic acidosis [W40.9] 01/01/2014  . Alcohol withdrawal ([place]) [F10.239] 01/01/2014  . GERD (gastroesophageal reflux disease) [K21.9] 01/01/2014  . Coarse tremors [G25.2] 01/01/2014    Total Time spent with patient: 45 minutes  Subjective:   [name] Stewart is a 35 y.o. female patient admitted with suicide attempt.  HPI:  On admission:  35 y.o. female presenting to [facility] after being petitioned for involuntary commitment by [employer]. It has been documented that pt called the suicide hotline and barricaded herself in the bathroom with a gun. Pt stated "I called a cop friend because I couldn't unload my gun". "I didn't think it was good for me to have a loaded gun in the house while I take bipolar medication". Pt reported that she open her drawer and realized that she had a loaded gun and realize that she shouldn't have one in the home. Pt denies SI, HI and AVH at this time but admitted to contacting the suicide hotline. Pt stated "I called them because I couldn't get through to the University hotline". "You know I have been struggling with my drinking". Pt reported she is currently in an IOP at [facility] and shared that she also receives medication management there as well. Pt reported that she has attempted suicide in the past but was unable to provide a time frame. Pt did not report any depressive symptoms at this time. PT reported  that she continues to struggle with her alcohol use and shared that she had a drink this morning. Pt stated "I had enough to taper myself". "I am trying to stop". Pt reported that she does have a hand gun. Pt reported that she has a pending DUI with an upcoming court date in Feb. 2017.   Today:  Patient minimizes her suicide attempt yesterday stating she was trying to get the gun out of her house, claiming she does not know how to use it.  However, she had called the suicide hotline for help as she was stressed with finances.  [name] Stewart was drinking also, at least a 1/4 of a gallon.  No homicidal ideations or hallucinations but definite threat to herself.  Past Psychiatric History: Bipolar disorder, alcohol dependence  Risk to Self: Suicidal Ideation: No (Pt denies. Per IVC pt suicidal with a gun. ) Suicidal Intent: No (Pt denies. Per IVC Pt suicidal with a gun. ) Is patient at risk for suicide?: Yes Suicidal Plan?: No (Pt denies. Per IVC pt suicidal with a gun. ) Access to Means: Yes Specify Access to Suicidal Means: Pt has a hand gun.  What has been your use of drugs/alcohol within the last 12 months?: Alcohol use reported.  How many times?: 1 Other Self Harm Risks: Pt denies  Triggers for Past Attempts: Unknown Intentional Self Injurious Behavior: None Risk to Others: Homicidal Ideation: No Thoughts of Harm to Others: No Current Homicidal Intent: No Current Homicidal Plan: No Access to Homicidal Means: No Identified Victim: N/A  History of harm to others?: No Assessment of Violence: None Noted Violent Behavior Description: No violent behaviors observed. Pt is calm and cooperative.  Does patient have access to weapons?: Yes (Comment) (Pt has a hand gun at home.) Criminal Charges Pending?: Yes Describe Pending Criminal Charges: DUI Does patient have a court date: Yes Court Date:  (Feb. 2017) Prior Inpatient Therapy: Prior Inpatient Therapy: Yes Prior Therapy Dates: 2016 Prior Therapy  Facilty/Provider(s): [facility], [facility] Reason for Treatment: Alcohol abuse Prior Outpatient Therapy: Prior Outpatient Therapy: Yes Prior Therapy Dates: Current  Prior Therapy Facilty/Provider(s): [facility]  Reason for Treatment: Bipolar, OCD, PTSD, Substance abuse  Does patient have an ACCT team?: No Does patient have Intensive In-House Services?  : No Does patient have [facility] services? : No Does patient have P4CC services?: No  Past Medical History:  Past Medical History  Diagnosis Date  . Alcoholic ([place])   . Anxiety   . Post traumatic stress disorder (PTSD)   . Alcohol related seizure ([place])   . Bipolar disorder ([place])   . Tension headache, chronic     "monthly" (01/31/2015)  . Depression     Past Surgical History  Procedure Laterality Date  . Osteochondroma excision Right ~ 1996    "knee"  . Excisional hemorrhoidectomy  20111  . Refractive surgery Bilateral ~ 2011   Family History:  Family History  Problem Relation Age of Onset  . Anxiety disorder Mother   . Anxiety disorder Sister   . Anxiety disorder Maternal Grandmother   . Anxiety disorder Father   . Alcohol abuse Father   . Alcohol abuse Brother   . Alcohol abuse Paternal Uncle    Family Psychiatric  History: None Social History:  History  Alcohol Use  . 162.6 oz/week  . 158 Glasses of wine, 113 Shots of liquor per week    Comment: 01/31/2015 "4 bottles of wine & 1 fifth of vodka daily"     History  Drug Use  . 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 Marital Status: Divorced    Spouse Name: N/A  . Number of Children: N/A  . Years of Education: N/A   Social History Main Topics  . Smoking status: Never Smoker   . Smokeless tobacco: Never Used  . Alcohol Use: 162.6 oz/week    158 Glasses of wine, 113 Shots of liquor per week     Comment: 01/31/2015 "4 bottles of wine & 1 fifth of vodka daily"  . Drug Use: Yes     Comment: "used heroin  once 09/2014"  . Sexual Activity: Not Currently   Other Topics Concern  . None   Social History Narrative   Reports she lives with a sober roommate.    Additional Social History:    History of alcohol / drug use?: Yes Longest period of sobriety (when/how long): 1 year Negative Consequences of Use: Financial, Legal, Personal relationships Name of Substance 1: Alcohol  1 - Age of First Use: 29 1 - Amount (size/oz): 2 or 3 bottles of wine and liquor 1 - Frequency: daily 1 - Duration: 5 days 1 - Last Use / Amount: 02-03-15 Name of Substance 2: Heroin  2 - Age of First Use: 34 2 - Amount (size/oz): unk 2 - Frequency: 1x use  2 - Duration: 1x use 2 - Last Use / Amount: Sept 2016 Name of Substance 3: THC 3 - Age of  First Use: patient unable to recall  3 - Amount (size/oz): patient unable to recall 3 - Frequency: patient unable to recall 3 - Duration: patient unable to recall  3 - Last Use / Amount: "Several years ago"               Allergies:   Allergies  Allergen Reactions  . Naltrexone Other (See Comments)    Makes her hurt everywhere. Anything higher than 35m gives her the issues.. Can take in smaller doses  . Baclofen Rash    Labs:  Results for orders placed or performed during the hospital encounter of 02/03/15 (from the past 48 hour(s))  Comprehensive metabolic panel     Status: Abnormal   Collection Time: 02/03/15  9:20 PM  Result Value Ref Range   Sodium 144 135 - 145 mmol/L   Potassium 4.0 3.5 - 5.1 mmol/L   Chloride 111 101 - 111 mmol/L   CO2 22 22 - 32 mmol/L   Glucose, Bld 102 (H) 65 - 99 mg/dL   BUN 21 (H) 6 - 20 mg/dL   Creatinine, Ser 0.55 0.44 - 1.00 mg/dL   Calcium 8.8 (L) 8.9 - 10.3 mg/dL   Total Protein 7.8 6.5 - 8.1 g/dL   Albumin 4.1 3.5 - 5.0 g/dL   AST 30 15 - 41 U/L   ALT 32 14 - 54 U/L   Alkaline Phosphatase 47 38 - 126 U/L   Total Bilirubin 0.3 0.3 - 1.2 mg/dL   GFR calc non Af Amer >60 >60 mL/min   GFR calc Af Amer >60 >60 mL/min     Comment: (NOTE) The eGFR has been calculated using the CKD EPI equation. This calculation has not been validated in all clinical situations. eGFR's persistently <60 mL/min signify possible Chronic Kidney Disease.    Anion gap 11 5 - 15  Ethanol     Status: Abnormal   Collection Time: 02/03/15  9:20 PM  Result Value Ref Range   Alcohol, Ethyl (B) 300 (H) <5 mg/dL    Comment:        LOWEST DETECTABLE LIMIT FOR SERUM ALCOHOL IS 5 mg/dL FOR MEDICAL PURPOSES ONLY   CBC with Diff     Status: Abnormal   Collection Time: 02/03/15  9:20 PM  Result Value Ref Range   WBC 7.0 4.0 - 10.5 K/uL   RBC 4.49 3.87 - 5.11 MIL/uL   Hemoglobin 13.5 12.0 - 15.0 g/dL   HCT 41.3 36.0 - 46.0 %   MCV 92.0 78.0 - 100.0 fL   MCH 30.1 26.0 - 34.0 pg   MCHC 32.7 30.0 - 36.0 g/dL   RDW 13.1 11.5 - 15.5 %   Platelets 233 150 - 400 K/uL   Neutrophils Relative % 31 %   Neutro Abs 2.1 1.7 - 7.7 K/uL   Lymphocytes Relative 62 %   Lymphs Abs 4.4 (H) 0.7 - 4.0 K/uL   Monocytes Relative 5 %   Monocytes Absolute 0.4 0.1 - 1.0 K/uL   Eosinophils Relative 1 %   Eosinophils Absolute 0.1 0.0 - 0.7 K/uL   Basophils Relative 1 %   Basophils Absolute 0.0 0.0 - 0.1 K/uL  Urine rapid drug screen (hosp performed)not at [facility]    Status: Abnormal   Collection Time: 02/03/15  9:48 PM  Result Value Ref Range   Opiates NONE DETECTED NONE DETECTED   Cocaine NONE DETECTED NONE DETECTED   Benzodiazepines POSITIVE (A) NONE DETECTED   Amphetamines NONE DETECTED NONE  DETECTED   Tetrahydrocannabinol NONE DETECTED NONE DETECTED   Barbiturates NONE DETECTED NONE DETECTED    Comment:        DRUG SCREEN FOR MEDICAL PURPOSES ONLY.  IF CONFIRMATION IS NEEDED FOR ANY PURPOSE, NOTIFY LAB WITHIN 5 DAYS.        LOWEST DETECTABLE LIMITS FOR URINE DRUG SCREEN Drug Class       Cutoff (ng/mL) Amphetamine      1000 Barbiturate      200 Benzodiazepine   132 Tricyclics       440 Opiates          300 Cocaine          300 THC               50     Current Facility-Administered Medications  Medication Dose Route Frequency Provider Last Rate Last Dose  . acetaminophen (TYLENOL) tablet 650 mg  650 mg Oral Q4H PRN Fransico Meadow, PA-C      . alum & mag hydroxide-simeth (MAALOX/MYLANTA) 200-200-20 MG/5ML suspension 30 mL  30 mL Oral PRN Fransico Meadow, PA-C      . Levonorgestrel-Ethinyl Estradiol (AMETHIA,CAMRESE) 0.15-0.03 &0.01 MG tablet 1 tablet  1 tablet Oral Daily Fransico Meadow, PA-C      . LORazepam (ATIVAN) tablet 1 mg  1 mg Oral Q8H PRN Fransico Meadow, PA-C   1 mg at 02/03/15 2311  . lurasidone (LATUDA) tablet 80 mg  80 mg Oral Q breakfast Hollace Kinnier Sofia, PA-C      . ondansetron [facility]) tablet 4 mg  4 mg Oral Q8H PRN Fransico Meadow, PA-C      . zolpidem (AMBIEN) tablet 5 mg  5 mg Oral QHS PRN Fransico Meadow, PA-C   5 mg at 02/04/15 0012   Current Outpatient Prescriptions  Medication Sig Dispense Refill  . chlordiazePOXIDE (LIBRIUM) 25 MG capsule Take 2 capsules twice daily for 3 days, then 1 capsule twice daily for 3 days, then 1 capsule once daily for 3 days, then stop taking. 30 capsule 0  . clonazePAM (KLONOPIN) 1 MG tablet Take 1 mg by mouth daily as needed (sleep).    . folic acid (FOLVITE) 1 MG tablet Take 1 tablet (1 mg total) by mouth daily. 90 tablet 3  . Levonorgest-Eth Estrad 91-Day (CAMRESE PO) Take 1 tablet by mouth daily.    Marland Kitchen lurasidone (LATUDA) 40 MG TABS tablet Take 1 tablet (40 mg total) by mouth daily with breakfast. (Patient taking differently: Take 80 mg by mouth daily with breakfast. ) 30 tablet 0  . multivitamin-iron-minerals-folic acid (CENTRUM) chewable tablet Chew 1 tablet by mouth daily.    . prazosin (MINIPRESS) 1 MG capsule Take 1 mg by mouth at bedtime. Take 47m and 251mto add up to 33m48maily    . prazosin (MINIPRESS) 2 MG capsule Take 2 mg by mouth daily. Take 1mg1md 2mg 61madd up to 33mg d333my    . valACYclovir (VALTREX) 500 MG tablet Take 500 mg by mouth 2 (two) times daily as needed  (outbreaks).     . Vilazodone HCl (VIIBRYD) 20 MG TABS Take 1 tablet (20 mg total) by mouth daily. (Patient taking differently: Take 40 mg by mouth daily. ) 7 tablet 0  . naltrexone (DEPADE) 50 MG tablet Take 25 mg by mouth daily.      Musculoskeletal: Strength & Muscle Tone: within normal limits Gait & Station: normal Patient leans: N/A  Psychiatric Specialty Exam:  Review of Systems  Constitutional: Negative.   HENT: Negative.   Eyes: Negative.   Respiratory: Negative.   Cardiovascular: Negative.   Gastrointestinal: Negative.   Genitourinary: Negative.   Musculoskeletal: Negative.   Skin: Negative.   Neurological: Negative.   Endo/Heme/Allergies: Negative.   Psychiatric/Behavioral: Positive for depression, suicidal ideas and substance abuse.    Blood pressure 129/90, pulse 95, temperature 98.2 F (36.8 C), temperature source Oral, resp. rate 18, SpO2 99 %.There is no weight on file to calculate BMI.  General Appearance: Disheveled  Eye Sport and exercise psychologist::  Fair  Speech:  Normal Rate  Volume:  Decreased  Mood:  Depressed  Affect:  Congruent  Thought Process:  Coherent  Orientation:  Full (Time, Place, and Person)  Thought Content:  Rumination  Suicidal Thoughts:  Yes.  with intent/plan  Homicidal Thoughts:  No  Memory:  Immediate;   Fair Recent;   Fair Remote;   Fair  Judgement:  Impaired  Insight:  Lacking  Psychomotor Activity:  Decreased  Concentration:  Fair  Recall:  [employer] of Knowledge:Fair  Language: Good  Akathisia:  No  Handed:  Right  AIMS (if indicated):     Assets:  Housing Leisure Time Physical Health Resilience Social Support  ADL's:  Intact  Cognition: WNL  Sleep:      Treatment Plan Summary: Daily contact with patient to assess and evaluate symptoms and progress in treatment, Medication management and Plan bipolar disorder, depression severe: -Crisis stabilization -Medication management:  Librium alcohol detox protocol started.  Latuda 80 mg at  bedtime for mood stabilization, Prazosin 3 mg at bedtime for nightmares, Vybrid 40 mg daily for depression restarted -Individual and substance abuse counseling  Disposition: Recommend psychiatric Inpatient admission when medically cleared.  Waylan Boga, [place] 02/04/2015 9:38 AM Patient seen face-to-face for psychiatric evaluation, chart reviewed and case discussed with the physician extender and developed treatment plan. Reviewed the information documented and agree with the treatment plan. Corena Pilgrim, MD

## 2015-02-04 NOTE — ED Notes (Signed)
Reports pain is improved, but is still 8/10, pt up the phone

## 2015-02-04 NOTE — ED Notes (Signed)
Dr A and Jamison dnp into see 

## 2015-02-04 NOTE — ED Notes (Signed)
Nad, reports that so far the detox has not been as bad, but is having a lot of anxiety

## 2015-02-04 NOTE — ED Notes (Signed)
Pharmacy dose not stock pt's BC pills, pt is aware and will contact her roommate to bring them.

## 2015-02-04 NOTE — ED Notes (Signed)
Pt. Noted in room. C/o insomnia. No distress or abnormal behavior noted. Will continue to monitor with security cameras. Q 15 minute rounds continue.

## 2015-02-05 ENCOUNTER — Encounter (HOSPITAL_COMMUNITY): Payer: Self-pay

## 2015-02-05 ENCOUNTER — Inpatient Hospital Stay (HOSPITAL_COMMUNITY)
Admission: AD | Admit: 2015-02-05 | Discharge: 2015-02-08 | DRG: 885 | Disposition: A | Discharge status: Home / Self Care | Payer: Federal, State, Local not specified - PPO | Attending: Psychiatry | Admitting: Psychiatry

## 2015-02-05 DIAGNOSIS — F332 Major depressive disorder, recurrent severe without psychotic features: Secondary | ICD-10-CM | POA: Diagnosis present

## 2015-02-05 DIAGNOSIS — F4310 Post-traumatic stress disorder, unspecified: Secondary | ICD-10-CM | POA: Diagnosis present

## 2015-02-05 DIAGNOSIS — Z818 Family history of other mental and behavioral disorders: Secondary | ICD-10-CM | POA: Diagnosis not present

## 2015-02-05 DIAGNOSIS — F1020 Alcohol dependence, uncomplicated: Secondary | ICD-10-CM | POA: Diagnosis not present

## 2015-02-05 DIAGNOSIS — F314 Bipolar disorder, current episode depressed, severe, without psychotic features: Secondary | ICD-10-CM | POA: Diagnosis present

## 2015-02-05 MED ORDER — LEVONORGEST-ETH ESTRAD 91-DAY 0.15-0.03 &0.01 MG PO TABS: 1.0000 | ORAL_TABLET | Freq: Every day | ORAL | Status: DC

## 2015-02-05 MED ORDER — ADULT MULTIVITAMIN W/MINERALS CH
1.0000 | ORAL_TABLET | Freq: Every day | ORAL | Status: DC
  Administered 2015-02-06 – 2015-02-08 (×3): 1 via ORAL
  Filled 2015-02-05 (×5): qty 1

## 2015-02-05 MED ORDER — LOPERAMIDE HCL 2 MG PO CAPS
2.0000 mg | ORAL_CAPSULE | ORAL | Status: AC | PRN
  Administered 2015-02-06: 4 mg via ORAL
  Filled 2015-02-05: qty 2
  Filled 2015-02-05: qty 1

## 2015-02-05 MED ORDER — CHLORDIAZEPOXIDE HCL 25 MG PO CAPS: 25.0000 mg | ORAL_CAPSULE | Freq: Four times a day (QID) | ORAL | Status: AC | PRN

## 2015-02-05 MED ORDER — IBUPROFEN 600 MG PO TABS
600.0000 mg | ORAL_TABLET | Freq: Three times a day (TID) | ORAL | Status: DC | PRN
  Administered 2015-02-05 – 2015-02-08 (×5): 600 mg via ORAL
  Filled 2015-02-05 (×5): qty 1

## 2015-02-05 MED ORDER — ACETAMINOPHEN 325 MG PO TABS: 650.0000 mg | ORAL_TABLET | Freq: Four times a day (QID) | ORAL | Status: DC | PRN

## 2015-02-05 MED ORDER — ALUM & MAG HYDROXIDE-SIMETH 200-200-20 MG/5ML PO SUSP
30.0000 mL | ORAL | Status: DC | PRN
  Administered 2015-02-06: 30 mL via ORAL
  Filled 2015-02-05: qty 30

## 2015-02-05 MED ORDER — VILAZODONE HCL 20 MG PO TABS
40.0000 mg | ORAL_TABLET | Freq: Every day | ORAL | Status: DC
  Administered 2015-02-06: 40 mg via ORAL
  Filled 2015-02-05 (×2): qty 2

## 2015-02-05 MED ORDER — VITAMIN B-1 100 MG PO TABS
100.0000 mg | ORAL_TABLET | Freq: Every day | ORAL | Status: DC
  Administered 2015-02-06 – 2015-02-08 (×3): 100 mg via ORAL
  Filled 2015-02-05 (×5): qty 1

## 2015-02-05 MED ORDER — PRAZOSIN HCL 1 MG PO CAPS
3.0000 mg | ORAL_CAPSULE | Freq: Every day | ORAL | Status: DC
  Administered 2015-02-05 – 2015-02-07 (×3): 3 mg via ORAL
  Filled 2015-02-05 (×6): qty 1

## 2015-02-05 MED ORDER — TRAZODONE HCL 50 MG PO TABS
50.0000 mg | ORAL_TABLET | Freq: Every evening | ORAL | Status: DC | PRN
  Administered 2015-02-05 – 2015-02-06 (×2): 50 mg via ORAL
  Filled 2015-02-05 (×6): qty 1

## 2015-02-05 MED ORDER — VALACYCLOVIR HCL 500 MG PO TABS
500.0000 mg | ORAL_TABLET | Freq: Two times a day (BID) | ORAL | Status: DC | PRN
  Filled 2015-02-05: qty 1

## 2015-02-05 MED ORDER — CHLORDIAZEPOXIDE HCL 25 MG PO CAPS
25.0000 mg | ORAL_CAPSULE | ORAL | Status: AC
  Administered 2015-02-06 – 2015-02-07 (×2): 25 mg via ORAL
  Filled 2015-02-05 (×2): qty 1

## 2015-02-05 MED ORDER — ONDANSETRON 4 MG PO TBDP: 4.0000 mg | ORAL_TABLET | Freq: Four times a day (QID) | ORAL | Status: AC | PRN

## 2015-02-05 MED ORDER — CHLORDIAZEPOXIDE HCL 25 MG PO CAPS
25.0000 mg | ORAL_CAPSULE | Freq: Three times a day (TID) | ORAL | Status: AC
  Administered 2015-02-06 (×2): 25 mg via ORAL
  Filled 2015-02-05 (×2): qty 1

## 2015-02-05 MED ORDER — CHLORDIAZEPOXIDE HCL 25 MG PO CAPS: 25.0000 mg | ORAL_CAPSULE | Freq: Every day | ORAL | Status: DC

## 2015-02-05 MED ORDER — LURASIDONE HCL 80 MG PO TABS
80.0000 mg | ORAL_TABLET | Freq: Every day | ORAL | Status: DC
  Administered 2015-02-05 – 2015-02-07 (×3): 80 mg via ORAL
  Filled 2015-02-05 (×4): qty 1
  Filled 2015-02-05: qty 2
  Filled 2015-02-05: qty 1

## 2015-02-05 MED ORDER — PRAZOSIN HCL 1 MG PO CAPS
ORAL_CAPSULE | ORAL | Status: AC
  Filled 2015-02-05: qty 3

## 2015-02-05 MED ORDER — MAGNESIUM HYDROXIDE 400 MG/5ML PO SUSP: 30.0000 mL | Freq: Every day | ORAL | Status: DC | PRN

## 2015-02-05 MED ORDER — HYDROXYZINE HCL 25 MG PO TABS
25.0000 mg | ORAL_TABLET | Freq: Four times a day (QID) | ORAL | Status: DC | PRN
  Administered 2015-02-05 – 2015-02-06 (×3): 25 mg via ORAL
  Filled 2015-02-05 (×3): qty 1

## 2015-02-05 NOTE — ED Notes (Signed)
Pt. Noted sleeping in room. No complaints or concerns voiced. No distress or abnormal behavior noted. Will continue to monitor with security cameras. Q 15 minute rounds continue. 

## 2015-02-05 NOTE — Progress Notes (Signed)
Patient ID: [name] Stewart, female   DOB: 07/29/1980, 35 y.o.   MRN: [id number]   35 [facility]. Pt has a history of alcoholic abuse, anxiety, depression, bipolar, chronic headaches and PTSD. Pt reports "I was coming down off a Bipolar high and knew I was hitting a low and that I needed to come in." Pt reports that she was experiencing bizarre and irritational thoughts like "buying myself an engagement ring" and obsessing over this for two hours. Pt identified this as a sign that she needed help. Pt reports that she would like for her medication to be adjusted because she feels her mood swings have not been controlled recently. Pt attends IOP and AA meetings. Pt reports her substance abuse history "before" and states "I am a part of AA, I have been in recovery." Pt works as a Museum/gallery curator"buisness consultant" and feels that if her moods were more stable she would be able to work more.   Pt has had seizures with alcohol withdrawal as well. Pt affect flat, mood irritable and behavior cooperative. Pt has a scar on her R posterior forearm from an injury two months ago. Pt states "I broke a toilet with my arm."    Pt currently denies SI/HI and A/V hallucinations. Pt verbally agrees to seek staff if SI/HI or A/VH occurs and to consult with staff before acting on these thoughts. Consents signed, skin/belongings search completed and pt oriented to unit. Pt stable at this time. Pt given the opportunity to express concerns and ask questions. Pt given toiletries and meal.

## 2015-02-05 NOTE — BH Assessment (Signed)
[facility] Assessment Progress Note  Reassessed pt, who was lying on her bed in her room. Pt appeared extremely anxious and stated that her OCD and PTSD are being triggered by being in the [facility]. "Being in here scares the shit out of me with patients yelling, and I can't wank around which is one of my coping skills because they talk to me".  She states that she is "coming back up form her low", but is concerned about how low she got. Pt requested something for anxiety. She continues to want treatment, but wants to get out of the [facility].

## 2015-02-05 NOTE — BH Assessment (Signed)
[facility] Assessment Progress Note  Per Thedore MinsMojeed Akintayo, MD, this pt requires psychiatric hospitalization.  Pt presents under IVC initiated by [employer] officer B. Loura HaltM. Hagstrom and upheld by Dr Jannifer FranklinAkintayo.  [name] Heinrichina Tate, RN, [id number].  [facility].  Pt's nurse, Rayfield CitizenCaroline, has been notified and agrees to call report to [phone].  Pt is to be transported via [employer].  [name] Canninghomas Keiyana Stehr, MA Triage Specialist [phone]

## 2015-02-05 NOTE — Progress Notes (Signed)
Patient ID: [name] Stewart, female   DOB: 07/08/1980, 35 y.o.   MRN: [id number] Initial Interdisciplinary Treatment Plan   PATIENT STRESSORS: Financial difficulties Medication change or noncompliance Occupational concerns Substance abuse   PATIENT STRENGTHS: Ability for insight Average or above average intelligence Communication skills General fund of knowledge Physical Health   PROBLEM LIST: Problem List/Patient Goals Date to be addressed Date deferred Reason deferred Estimated date of resolution  "I don't want these highs and lows" - medication change 02/05/2015      "I cannot handle the lows" - SI 02/05/2015     "I don't drink as much as I used too, until right before I came in" - substance abuse 02/05/2015     "I want to be able to work"  02/05/2015                                    DISCHARGE CRITERIA:  Ability to meet basic life and health needs Improved stabilization in mood, thinking, and/or behavior Motivation to continue treatment in a less acute level of care Safe-care adequate arrangements made Withdrawal symptoms are absent or subacute and managed without 24-hour nursing intervention  PRELIMINARY DISCHARGE PLAN: Attend 12-step recovery group Outpatient therapy Return to previous living arrangement Return to previous work or school arrangements  PATIENT/FAMIILY INVOLVEMENT: This treatment plan has been presented to and reviewed with the patient, [name] Lollingrin Zalar.The patient and family have been given the opportunity to ask questions and make suggestions.  Aurora Maskwyman, Tamim Skog E 02/05/2015, 5:57 PM

## 2015-02-05 NOTE — ED Notes (Signed)
Patient received all personal belongings.  Admission reviewed with patient and she indicated understanding.  Patient denies SI/HI/AVH.  Report called to Daisy BlossomSara W., RN.  Patient left ambulatory.  [facility] [employer].

## 2015-02-05 NOTE — Progress Notes (Signed)
Adult Psychoeducational Group Note  Date:  02/05/2015 Time:  9:41 PM  Group Topic/Focus:  Wrap-Up Group:   The focus of this group is to help patients review their daily goal of treatment and discuss progress on daily workbooks.  Participation Level:  Active  Participation Quality:  Appropriate and Attentive  Affect:  Appropriate  Cognitive:  Appropriate  Insight: Appropriate  Engagement in Group:  Engaged  Modes of Intervention:  Discussion  Additional Comments:  Pt stated her goal for tomorrow is meet with the doctor and get her meds straight.  Caswell CorwinOwen, Thomasena Vandenheuvel C 02/05/2015, 9:41 PM

## 2015-02-05 NOTE — ED Notes (Signed)
Patient presents with anxious and depressed mood.  She has requested medications for anxiety X2.   Patient became agitated this morning when a patient became disruptive.  Patient continues to c/o withdrawal symptoms such as stomach cramps, anxiety, cravings and tremors.  She was also given ibuprofen for back pain.  Patient states, "I want to make sure I go to [facility]."  Explained to patient that I did not know her disposition as of yet. Patient denies SI/HI/AVH.

## 2015-02-06 ENCOUNTER — Encounter (HOSPITAL_COMMUNITY): Payer: Self-pay | Admitting: Psychiatry

## 2015-02-06 DIAGNOSIS — F314 Bipolar disorder, current episode depressed, severe, without psychotic features: Principal | ICD-10-CM

## 2015-02-06 DIAGNOSIS — F4310 Post-traumatic stress disorder, unspecified: Secondary | ICD-10-CM

## 2015-02-06 DIAGNOSIS — F1020 Alcohol dependence, uncomplicated: Secondary | ICD-10-CM

## 2015-02-06 MED ORDER — VILAZODONE HCL 20 MG PO TABS: 20.0000 mg | ORAL_TABLET | Freq: Every day | ORAL | Status: DC

## 2015-02-06 MED ORDER — LAMOTRIGINE 25 MG PO TABS
25.0000 mg | ORAL_TABLET | Freq: Every day | ORAL | Status: DC
  Administered 2015-02-06 – 2015-02-08 (×3): 25 mg via ORAL
  Filled 2015-02-06 (×6): qty 1
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
## 2015-02-06 NOTE — Progress Notes (Signed)
Adult Psychoeducational Group Note  Date:  02/06/2015 Time:  11:13 PM  Group Topic/Focus:  Wrap-Up Group:   The focus of this group is to help patients review their daily goal of treatment and discuss progress on daily workbooks.  Participation Level:  Active  Participation Quality:  Appropriate  Affect:  Appropriate  Cognitive:  Alert  Insight: Appropriate  Engagement in Group:  Engaged  Modes of Intervention:  Discussion  Additional Comments:  Patient goal for today was to talk with the doctor about her medication and discharge plan. Patient rated her day as an 8 because, 'I'm still here and don't want to be here."  Mariame Rybolt L Alecsander Hattabaugh 02/06/2015, 11:13 PM

## 2015-02-06 NOTE — H&P (Signed)
Psychiatric Admission Assessment Adult  Patient Identification: [name] Stewart MRN:  [id number] Date of Evaluation:  02/06/2015 Chief Complaint:  BIPOLAR DISORDER WITH SEVERE DEPRESSION ALCOHOL USE DISORDER,SEVERE,DEPENDENCE Principal Diagnosis: <principal problem not specified> Diagnosis:   Patient Active Problem List   Diagnosis Date Noted  . Major depressive disorder, recurrent episode, severe (HCC) [F33.2] 02/05/2015  . PTSD (post-traumatic stress disorder) [F43.10] 12/14/2014  . Bipolar disorder with severe depression (HCC) [F31.4] 12/14/2014  . Alcohol use disorder, severe, dependence (HCC) [F10.20] 12/13/2014  . Metabolic acidosis [E87.2] 01/01/2014  . Alcohol withdrawal (HCC) [F10.239] 01/01/2014  . Alcohol abuse [F10.10] 01/01/2014  . GERD (gastroesophageal reflux disease) [K21.9] 01/01/2014  . Coarse tremors [G25.2] 01/01/2014   History of Present Illness:: 35 Y/O female who was last admitted to [facility] from November 17 to the 20. States she has been  really struggling. Diagnosed with Bipolar Disorder. Having some high highs and some low lows. States she has a gun what made her worry. She called the help lined. States she has not been drinking but drank that night. States she feels the medications are not right. States for the past 2 weeks has seen increased mood swings. The high's have been high. States she is coming out of the down. Saw Dr. Mila Homer 2 weeks ago. She had been in the hospital (Jan 5-6) Was drinking and stop. She was  afraid she could have a seizure. Two weeks ago her Viibryd was doubled to 40 and her Latuda was doubled to 80 mg.    The initial assessment was as follows: [name] Stewart is an 35 y.o. female presenting to [facility] after being petitioned for involuntary commitment by [employer]. It has been documented that pt called the suicide hotline and barricaded herself in the bathroom with a gun. Pt stated "I called a cop friend because I couldn't unload my gun". "I didn't think it was good  for me to have a loaded gun in the house while I take bipolar medication". Pt reported that she open her drawer and realized that she had a loaded gun and realize that she shouldn't have one in the home. Pt denies SI, HI and AVH at this time but admitted to contacting the suicide hotline. Pt stated "I called them because I couldn't get through to the AA hotline". "You know I have been struggling with my drinking". Pt reported she is currently in an IOP at [facility] and shared that she also receives medication management there as well. Pt reported that she has attempted suicide in the past but was unable to provide a time frame. Pt did not report any depressive symptoms at this time. PT reported that she continues to struggle with her alcohol use and shared that she had a drink this morning. Pt stated "I had enough to taper myself". "I am trying to stop". Pt reported that she does have a hand gun. Pt reported that she has a pending DUI with an upcoming court date in Feb. 2017.   Associated Signs/Symptoms: Depression Symptoms:  depressed mood, anhedonia, psychomotor retardation, fatigue, suicidal thoughts with specific plan, anxiety, panic attacks, loss of energy/fatigue, disturbed sleep, (Hypo) Manic Symptoms:  Financial Extravagance, Impulsivity, Irritable Mood, Labiality of Mood, Sexually Inapproprite Behavior, Anxiety Symptoms:  Excessive Worry, Panic Symptoms, Psychotic Symptoms:  denies PTSD Symptoms: Had a traumatic exposure:  abuse Re-experiencing:  Intrusive Thoughts Hypervigilance:  Yes Hyperarousal:  Increased Startle Response Total Time spent with patient: 45 minutes  Past Psychiatric History:   Risk to Self:  Is patient at risk for suicide?: Yes Risk to Others:  No Prior Inpatient Therapy:  [facility] Prior Outpatient Therapy:  [facility]   Alcohol Screening: Patient refused Alcohol Screening Tool: Yes ("I hadn't been drinking  until right before I came in." ) Brief Intervention: Patient declined brief intervention Substance Abuse History in the last 12 months:  Yes.   Consequences of Substance Abuse: Legal Consequences:  DWI Blackouts:   Previous Psychotropic Medications: Yes Latuda (80mg ) Viibryd (40mg ) Naltrexone Effexor Wellbutrin Pristiq Celexa Zoloft Xanax Klonopin Tegretol  Psychological Evaluations: No  Past Medical History:  Past Medical History  Diagnosis Date  . Alcoholic (HCC)   . Anxiety   . Post traumatic stress disorder (PTSD)   . Alcohol related seizure (HCC)   . Bipolar disorder (HCC)   . Tension headache, chronic     "monthly" (01/31/2015)  . Depression     Past Surgical History  Procedure Laterality Date  . Osteochondroma excision Right ~ 1996    "knee"  . Excisional hemorrhoidectomy  20111  . Refractive surgery Bilateral ~ 2011   Family History:  Family History  Problem Relation Age of Onset  . Anxiety disorder Mother   . Anxiety disorder Sister   . Anxiety disorder Maternal Grandmother   . Anxiety disorder Father   . Alcohol abuse Father   . Alcohol abuse Brother   . Alcohol abuse Paternal Uncle    Family Psychiatric  History: on father's side couple of people have Bipolar Disorder also alcohol and anxiety Social History:  History  Alcohol Use  . 162.6 oz/week  . 158 Glasses of wine, 113 Shots of liquor per week    Comment: 01/31/2015 "4 bottles of wine & 1 fifth of vodka daily"     History  Drug Use  . 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 Marital Status: Divorced    Spouse Name: N/A  . Number of Children: N/A  . Years of Education: N/A   Social History Main Topics  . Smoking status: Never Smoker   . Smokeless tobacco: Never Used  . Alcohol Use: 162.6 oz/week    158 Glasses of wine, 113 Shots of liquor per week     Comment: 01/31/2015 "4 bottles of wine & 1 fifth of vodka daily"  . Drug Use: Yes     Comment: "used  heroin once 09/2014"  . Sexual Activity: Not Currently   Other Topics Concern  . None   Social History Narrative   Reports she lives with a sober roommate.   Has a masters degree in developmental psychology. 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 Divorced no children Additional Social History:                         Allergies:   Allergies  Allergen Reactions  . Naltrexone Other (See Comments)    Makes her hurt everywhere. Anything higher than 25mg  gives her the issues.. Can take in smaller doses  . Baclofen Rash   Lab Results: No results found for this or any previous visit (from the past 48 hour(s)).  Metabolic Disorder Labs:  No results found for: HGBA1C, MPG No results found for: PROLACTIN No results found for: CHOL, TRIG, HDL, CHOLHDL, VLDL, LDLCALC  Current Medications: Current Facility-Administered Medications  Medication Dose Route Frequency Provider Last Rate  Last Dose  . acetaminophen (TYLENOL) tablet 650 mg  650 mg Oral Q6H PRN Charm Rings, NP      . alum & mag hydroxide-simeth (MAALOX/MYLANTA) 200-200-20 MG/5ML suspension 30 mL  30 mL Oral Q4H PRN Charm Rings, NP   30 mL at 02/06/15 0816  . chlordiazePOXIDE (LIBRIUM) capsule 25 mg  25 mg Oral Q6H PRN Charm Rings, NP      . chlordiazePOXIDE (LIBRIUM) capsule 25 mg  25 mg Oral TID Charm Rings, NP   25 mg at 02/06/15 0816   Followed by  . chlordiazePOXIDE (LIBRIUM) capsule 25 mg  25 mg Oral BH-qamhs Charm Rings, NP       Followed by  . [START ON 02/08/2015] chlordiazePOXIDE (LIBRIUM) capsule 25 mg  25 mg Oral Daily Charm Rings, NP      . hydrOXYzine (ATARAX/VISTARIL) tablet 25 mg  25 mg Oral Q6H PRN Charm Rings, NP   25 mg at 02/05/15 2117  . ibuprofen (ADVIL,MOTRIN) tablet 600 mg  600 mg Oral Q8H PRN Charm Rings, NP   600 mg at 02/06/15 0816  . Levonorgestrel-Ethinyl Estradiol (AMETHIA,CAMRESE) 0.15-0.03 &0.01 MG tablet 1 tablet  1  tablet Oral Daily Charm Rings, NP   1 tablet at 02/06/15 0818  . loperamide (IMODIUM) capsule 2-4 mg  2-4 mg Oral PRN Charm Rings, NP   4 mg at 02/06/15 0847  . lurasidone (LATUDA) tablet 80 mg  80 mg Oral QHS Charm Rings, NP   80 mg at 02/05/15 2117  . magnesium hydroxide (MILK OF MAGNESIA) suspension 30 mL  30 mL Oral Daily PRN Charm Rings, NP      . multivitamin with minerals tablet 1 tablet  1 tablet Oral Daily Charm Rings, NP   1 tablet at 02/06/15 0817  . ondansetron (ZOFRAN-ODT) disintegrating tablet 4 mg  4 mg Oral Q6H PRN Charm Rings, NP      . prazosin (MINIPRESS) capsule 3 mg  3 mg Oral QHS Charm Rings, NP   3 mg at 02/05/15 2118  . thiamine (VITAMIN B-1) tablet 100 mg  100 mg Oral Daily Charm Rings, NP   100 mg at 02/06/15 0816  . traZODone (DESYREL) tablet 50 mg  50 mg Oral QHS,MR X 1 Spencer E Simon, PA-C   50 mg at 02/05/15 2250  . valACYclovir (VALTREX) tablet 500 mg  500 mg Oral BID PRN Charm Rings, NP      . Vilazodone HCl TABS 40 mg  40 mg Oral Daily Charm Rings, NP   40 mg at 02/06/15 4098   PTA Medications: Prescriptions prior to admission  Medication Sig Dispense Refill Last Dose  . chlordiazePOXIDE (LIBRIUM) 25 MG capsule Take 2 capsules twice daily for 3 days, then 1 capsule twice daily for 3 days, then 1 capsule once daily for 3 days, then stop taking. 30 capsule 0 02/03/2015 at Unknown time  . clonazePAM (KLONOPIN) 1 MG tablet Take 1 mg by mouth daily as needed (sleep).   02/02/2015 at Unknown time  . folic acid (FOLVITE) 1 MG tablet Take 1 tablet (1 mg total) by mouth daily. 90 tablet 3 02/03/2015 at Unknown time  . Levonorgest-Eth Estrad 91-Day (CAMRESE PO) Take 1 tablet by mouth daily.   02/02/2015 at Unknown time  . lurasidone (LATUDA) 40 MG TABS tablet Take 1 tablet (40 mg total) by mouth daily with breakfast. (Patient taking differently: Take 80  mg by mouth daily with breakfast. ) 30 tablet 0 02/02/2015 at Unknown time  .  multivitamin-iron-minerals-folic acid (CENTRUM) chewable tablet Chew 1 tablet by mouth daily.   02/03/2015 at Unknown time  . naltrexone (DEPADE) 50 MG tablet Take 25 mg by mouth daily.   starting on tuesday  . prazosin (MINIPRESS) 1 MG capsule Take 1 mg by mouth at bedtime. Take 1mg  and 2mg  to add up to 3mg  daily   02/02/2015 at Unknown time  . prazosin (MINIPRESS) 2 MG capsule Take 2 mg by mouth daily. Take 1mg  and 2mg  to add up to 3mg  daily   02/02/2015 at Unknown time  . valACYclovir (VALTREX) 500 MG tablet Take 500 mg by mouth 2 (two) times daily as needed (outbreaks).    Past Week at Unknown time  . Vilazodone HCl (VIIBRYD) 20 MG TABS Take 1 tablet (20 mg total) by mouth daily. (Patient taking differently: Take 40 mg by mouth daily. ) 7 tablet 0 02/03/2015 at Unknown time    Musculoskeletal: Strength & Muscle Tone: within normal limits Gait & Station: normal Patient leans: normal  Psychiatric Specialty Exam: Physical Exam  Review of Systems  Constitutional: Negative.   HENT: Negative.   Eyes: Negative.   Respiratory: Negative.   Cardiovascular: Negative.   Gastrointestinal: Negative.   Genitourinary: Negative.   Musculoskeletal: Negative.   Skin: Negative.   Neurological: Negative.   Endo/Heme/Allergies: Negative.   Psychiatric/Behavioral: Positive for depression and substance abuse. The patient is nervous/anxious.     Blood pressure 84/57, pulse 95, temperature 98.3 F (36.8 C), temperature source Oral, resp. rate 18, height 5\' 4"  (1.626 m), weight 59.875 kg (132 lb), SpO2 100 %.Body mass index is 22.65 kg/(m^2).  General Appearance: Fairly Groomed  Patent attorney::  Fair  Speech:  Clear and Coherent  Volume:  Decreased  Mood:  Anxious, Depressed and worried  Affect:  sad, anxious worried  Thought Process:  Coherent and Goal Directed  Orientation:  Full (Time, Place, and Person)  Thought Content:  symptomse events worries concerns  Suicidal Thoughts:  Not right now  Homicidal  Thoughts:  No  Memory:  Immediate;   Fair Recent;   Fair Remote;   Fair  Judgement:  Fair  Insight:  Present  Psychomotor Activity:  Restlessness  Concentration:  Fair  Recall:  [employer] of Knowledge:Fair  Language: Fair  Akathisia:  No  Handed:  Right  AIMS (if indicated):     Assets:  Desire for Improvement Housing Social Support Vocational/Educational  ADL's:  Intact  Cognition: WNL  Sleep:  Number of Hours: 6.25     Treatment Plan Summary: Daily contact with patient to assess and evaluate symptoms and progress in treatment and Medication management Supportive approach/coping skills Alcohol dependence; Librium detox protocol/work a relapse prevention plan Mood instability; will D/C the Viibryd. The Viibryd was doubled at the same time that the [place] was doubled. She feels the Viibryd has not worked for her and it is too expensive. She would like to be off (the increase in Viibryd could be one reason why she had been  having the drastic mood swings during the last 2 weeks)  Mood instability; will continue the Latuda 80 mg  Depression; will add Lamictal 25 mg and at this stage try to avoid using antidepressants Will work with CBT/mindfulness Observation Level/Precautions:  15 minute checks  Laboratory:  As per the ED and hemoglobin A 1 C, Lipid panel, Prolactin  Psychotherapy: Individual/group   Medications:  Librium detox protocol/reassess her psychotropics  Consultations:    Discharge Concerns:  Need for a residential treatment program  Estimated LOS: 3-5 days  Other:     I certify that inpatient services furnished can reasonably be expected to improve the patient's condition.   Jeancarlos Marchena A 1/10/201710:34 AM

## 2015-02-06 NOTE — Progress Notes (Signed)
Patient ID: [name] Stewart, female   DOB: December 18, 1980, 35 y.o.   MRN: [id number] D: "I'm anxious partly because of been here". Pt reports she is here to get medication adjusted and does not need to stay any longer than she needs to. Pt denies SI/HI/AVH. Pt attended and participated in evening wrap up group. Cooperative with assessment.   A: Met with pt 1:1. Medications administered as prescribed. Support and encouragement provided.    R: Patient remains safe and complaint with medications.

## 2015-02-06 NOTE — [facility] Group Notes (Signed)
[facility] LCSW Group Therapy  02/06/2015 1:24 PM  Type of Therapy:  Group Therapy  Participation Level:  PT left group after 5 minutes and did not return.  Modes of Intervention:  Discussion, Education, Exploration, Problem-solving, Socialization and Support  Summary of Progress/Problems: [facility] Speaker came to talk about his personal journey with substance abuse and addiction. The pt processed ways by which to relate to the speaker. MHA speaker provided handouts and educational information pertaining to groups and services [facility] by [facility].    Smart, Kenni Newton LCSW 02/06/2015, 1:24 PM

## 2015-02-06 NOTE — Plan of Care (Signed)
Problem: Alteration in mood & ability to function due to Goal: STG-Patient will attend groups Outcome: Progressing Pt attended evening wrap up group     

## 2015-02-06 NOTE — Tx Team (Signed)
Interdisciplinary Treatment Plan Update (Adult)  Date:  02/06/2015  Time Reviewed:  8:52 AM   Progress in Treatment: Attending groups: No.Pt new to unit. Continuing to assess.  Participating in groups:  No. Taking medication as prescribed:  Yes. Tolerating medication:  Yes. Family/Significant othe contact made:  SPE required for this pt.  Patient understands diagnosis:  Yes. and As evidenced by:  seeking treatment for medication stabilization, ETOH abuse, depression/bipolar dx.PT IVCed by [employer] after baricading herself in bathroom with gun.  Discussing patient identified problems/goals with staff:  Yes. Medical problems stabilized or resolved:  Yes. Denies suicidal/homicidal ideation: Yes. Issues/concerns per patient self-inventory:  Other  Discharge Plan or Barriers: Pt current with [facility] for medication management and SAIOP. CSW assessing.   Reason for Continuation of Hospitalization: Depression Medication stabilization Withdrawal symptoms  Comments:  [name] Stewart is an 35 y.o. female presenting to [facility] after being petitioned for involuntary commitment by [employer]. It has been documented that pt called the suicide hotline and barricaded herself in the bathroom with a gun. Pt stated "I called a cop friend because I couldn't unload my gun". "I didn't think it was good for me to have a loaded gun in the house while I take bipolar medication". Pt reported that she open her drawer and realized that she had a loaded gun and realize that she shouldn't have one in the home. Pt denies SI, HI and AVH at this time but admitted to contacting the suicide hotline. Pt stated "I called them because I couldn't get through to the [place] hotline". "You know I have been struggling with my drinking". Pt reported she is currently in an IOP at [facility] and shared that she also receives medication management there as well. Pt reported that she has attempted suicide in the past but was unable to provide a time  frame. Pt did not report any depressive symptoms at this time. PT reported that she continues to struggle with her alcohol use and shared that she had a drink this morning. Pt stated "I had enough to taper myself". "I am trying to stop". Pt reported that she does have a hand gun. Pt reported that she has a pending DUI with an upcoming court date in Feb. 2017.Diagnosis: Alcohol use disorder, moderate; PTSD, Bipolar   Estimated length of stay:  3-5 days   New goal(s): to develop effective aftercare plan.   Additional Comments:  Patient and CSW reviewed pt's identified goals and treatment plan. Patient verbalized understanding and agreed to treatment plan. CSW reviewed [facility] "Discharge Process and Patient Involvement" Form. Pt verbalized understanding of information provided and signed form.    Review of initial/current patient goals per problem list:  1. Goal(s): Patient will participate in aftercare plan  Met: No.   Target date: at discharge  As evidenced by: Patient will participate within aftercare plan AEB aftercare provider and housing plan at discharge being identified.  1/10: CSW assessing for appropriate referrals. Pt current with the [facility] for Med management and SAIOP.   2. Goal (s): Patient will exhibit decreased depressive symptoms and suicidal ideations.  Met: No.    Target date: at discharge  As evidenced by: Patient will utilize self rating of depression at 3 or below and demonstrate decreased signs of depression or be deemed stable for discharge by MD.  1/10: Pt rates depression as high. Denies SI/HI/AVH at this time. Pt reports impulsivity associated with bipolar dx.  3. Goal(s): Patient will demonstrate decreased signs  of withdrawal due to substance abuse  Met:No.   Target date:at discharge   As evidenced by: Patient will produce a CIWA/COWS score of 0, have stable vitals signs, and no symptoms of withdrawal.  1/10: Pt reports moderate withdrawals  with CIWA score of 3 and low BP. All other vitals are stable.   Attendees: Patient:   02/06/2015 8:52 AM   Family:   02/06/2015 8:52 AM   Physician:  Dr. Carlton Adam, MD 02/06/2015 8:52 AM   Nursing:   Rayann Heman RN 02/06/2015 8:52 AM   Clinical Social Worker: Maxie Better, LCSW 02/06/2015 8:52 AM   Clinical Social Worker: Erasmo Downer Drinkard LCSWA; Peri Maris LCSWA 02/06/2015 8:52 AM   Other:  Gerline Legacy Nurse Case Manager 02/06/2015 8:52 AM   Other:  Agustina Caroli NP   02/06/2015 8:52 AM   Other:   02/06/2015 8:52 AM   Other:  02/06/2015 8:52 AM   Other:  02/06/2015 8:52 AM   Other:  02/06/2015 8:52 AM    02/06/2015 8:52 AM    02/06/2015 8:52 AM    02/06/2015 8:52 AM    02/06/2015 8:52 AM    Scribe for Treatment Team:   Maxie Better, LCSW 02/06/2015 8:52 AM

## 2015-02-06 NOTE — Progress Notes (Signed)
Patient was anxious this shift.  Patient had complaints of upset stomach and menstrual cramps.  Patient reported feeling like she needed her medications adjusted appropriately. Patient denies SI, HI and AVH.   Assess client for safety, offer, medications as prescribed, engage writer in 1:1 staff talks,   Patient able to contract for safety.

## 2015-02-06 NOTE — [facility] Suicide Risk Assessment (Signed)
[facility]   Nursing information obtained from:    Demographic factors:    Current Mental Status:    Loss Factors:    Historical Factors:    Risk Reduction Factors:    Total Time spent with patient: 45 minutes Principal Problem: Bipolar disorder with severe depression (HCC) Diagnosis:   Patient Active Problem List   Diagnosis Date Noted  . Major depressive disorder, recurrent episode, severe (HCC) [F33.2] 02/05/2015  . PTSD (post-traumatic stress disorder) [F43.10] 12/14/2014  . Bipolar disorder with severe depression (HCC) [F31.4] 12/14/2014  . Alcohol use disorder, severe, dependence (HCC) [F10.20] 12/13/2014  . Metabolic acidosis [E87.2] 01/01/2014  . Alcohol withdrawal (HCC) [F10.239] 01/01/2014  . Alcohol abuse [F10.10] 01/01/2014  . GERD (gastroesophageal reflux disease) [K21.9] 01/01/2014  . Coarse tremors [G25.2] 01/01/2014     Continued Clinical Symptoms:    The "Alcohol Use Disorders Identification Test", Guidelines for Use in Primary Care, Second Edition.  World Science writerHealth Organization [facility](WHO). Score between 0-7:  no or low risk or alcohol related problems. Score between 8-15:  moderate risk of alcohol related problems. Score between 16-19:  high risk of alcohol related problems. Score 20 or above:  warrants further diagnostic evaluation for alcohol dependence and treatment.   CLINICAL FACTORS:   Bipolar Disorder:   Mixed State Depressive phase Alcohol/Substance Abuse/Dependencies   Psychiatric Specialty Exam: Physical Exam  ROS  Blood pressure 84/57, pulse 95, temperature 98.3 F (36.8 C), temperature source Oral, resp. rate 18, height 5\' 4"  (1.626 m), weight 59.875 kg (132 lb), SpO2 100 %.Body mass index is 22.65 kg/(m^2).    COGNITIVE FEATURES THAT CONTRIBUTE TO RISK:  Closed-mindedness, Polarized thinking and Thought constriction (tunnel vision)    SUICIDE RISK:   Moderate:  Frequent suicidal ideation with limited intensity, and  duration, some specificity in terms of plans, no associated intent, good self-control, limited dysphoria/symptomatology, some risk factors present, and identifiable protective factors, including available and accessible social support.  PLAN OF CARE: see admission H and P  Medical Decision Making:  Review of Psycho-Social Stressors (1), Review or order clinical lab tests (1), Review of Medication Regimen & Side Effects (2) and Review of New Medication or Change in Dosage (2)  I certify that inpatient services furnished can reasonably be expected to improve the patient's condition.   Kalik Hoare A 02/06/2015, 4:30 PM

## 2015-02-07 LAB — LIPID PANEL
CHOL/HDL RATIO: 3.2 ratio
CHOLESTEROL: 146 mg/dL (ref 0–200)
HDL: 46 mg/dL (ref >40)
LDL Cholesterol: 72 mg/dL (ref 0–99)
TRIGLYCERIDES: 138 mg/dL (ref <150)
VLDL: 28 mg/dL (ref 0–40)

## 2015-02-07 MED ORDER — HYDROXYZINE HCL 25 MG PO TABS
25.0000 mg | ORAL_TABLET | ORAL | Status: DC | PRN
  Administered 2015-02-07 – 2015-02-08 (×4): 25 mg via ORAL
  Filled 2015-02-07 (×5): qty 1

## 2015-02-07 MED ORDER — ACAMPROSATE CALCIUM 333 MG PO TBEC
666.0000 mg | DELAYED_RELEASE_TABLET | Freq: Three times a day (TID) | ORAL | Status: DC
  Administered 2015-02-07 – 2015-02-08 (×4): 666 mg via ORAL
  Filled 2015-02-07 (×9): qty 2

## 2015-02-07 NOTE — [facility] Group Notes (Signed)
[facility] LCSW Group Therapy  02/07/2015 1:11 PM  Type of Therapy:  Group Therapy  Participation Level:  Active  Participation Quality:  Attentive  Affect:  Appropriate  Cognitive:  Alert and Oriented  Insight:  Improving  Engagement in Therapy:  Improving  Modes of Intervention:  Confrontation, Discussion, Education, Exploration, Problem-solving, Rapport Building, Socialization and Support  Summary of Progress/Problems: Emotion Regulation: This group focused on both positive and negative emotion identification and allowed group members to process ways to identify feelings, regulate negative emotions, and find healthy ways to manage internal/external emotions. Group members were asked to reflect on a time when their reaction to an emotion led to a negative outcome and explored how alternative responses using emotion regulation would have benefited them. Group members were also asked to discuss a time when emotion regulation was utilized when a negative emotion was experienced. [name] Stewart was attentive and engaged during today's processing group. She shared that she struggles with mood fluctuations associated with her bipolar disorder which often trigger relapsing on alcohol. [name] Stewart shared her latest experience and shared her positive experiences at the [facility]'s SAIOP program. [name] Stewart answered multiple questions about the program asked by other patients and continues to make progress in the group setting.   Smart, Rechy Bost LCSW 02/07/2015, 1:11 PM

## 2015-02-07 NOTE — Progress Notes (Signed)
Recreation Therapy Notes  Date: 01.11.2017  Time: 9:30am Location: [address] Hall Group Room   Group Topic: Stress Management  Goal Area(s) Addresses:  Patient will actively participate in stress management techniques presented during session.   Behavioral Response: Appropriate, Engaged.   Intervention: Stress management techniques  Activity :  Deep Breathing, Progressive Muscle Relaxation andGuided Imagery. LRT provided instruction and demonstration on practice of Progressive Muscle Relaxation and Guided Imagery. Technique was coupled with deep breathing.   Education:  Stress Management, Discharge Planning.   Education Outcome: Acknowledges education  Clinical Observations/Feedback: Patient actively engaged in technique introduced, expressed no concerns and demonstrated ability to practice independently post d/c.   Korrey Schleicher L Tekia [place]bury, LRT/CTRS  Johntavius Shepard L 02/07/2015 6:25 PM 

## 2015-02-07 NOTE — Progress Notes (Signed)
[facility] MD Progress Note  02/07/2015 6:05 PM [name] Stewart  MRN:  [id number] Subjective:  [name] Stewart states that she is staring to feel better. She is trying to get her life back together. She is still endorsing alcohol cravings would like to be off the naltrexone and try something else. States that off the Viibryd she has not noticed any major changes.  Principal Problem: Bipolar disorder with severe depression (HCC) Diagnosis:   Patient Active Problem List   Diagnosis Date Noted  . Major depressive disorder, recurrent episode, severe (HCC) [F33.2] 02/05/2015  . PTSD (post-traumatic stress disorder) [F43.10] 12/14/2014  . Bipolar disorder with severe depression (HCC) [F31.4] 12/14/2014  . Alcohol use disorder, severe, dependence (HCC) [F10.20] 12/13/2014  . Metabolic acidosis [E87.2] 01/01/2014  . Alcohol withdrawal (HCC) [F10.239] 01/01/2014  . Alcohol abuse [F10.10] 01/01/2014  . GERD (gastroesophageal reflux disease) [K21.9] 01/01/2014  . Coarse tremors [G25.2] 01/01/2014   Total Time spent with patient: 20 minutes  Past Psychiatric History:   Past Medical History:  Past Medical History  Diagnosis Date  . Alcoholic (HCC)   . Anxiety   . Post traumatic stress disorder (PTSD)   . Alcohol related seizure (HCC)   . Bipolar disorder (HCC)   . Tension headache, chronic     "monthly" (01/31/2015)  . Depression     Past Surgical History  Procedure Laterality Date  . Osteochondroma excision Right ~ 1996    "knee"  . Excisional hemorrhoidectomy  20111  . Refractive surgery Bilateral ~ 2011   Family History:  Family History  Problem Relation Age of Onset  . Anxiety disorder Mother   . Anxiety disorder Sister   . Anxiety disorder Maternal Grandmother   . Anxiety disorder Father   . Alcohol abuse Father   . Alcohol abuse Brother   . Alcohol abuse Paternal Uncle    Family Psychiatric  History: see admission H and P Social History:  History  Alcohol Use  . 162.6 oz/week  . 158 Glasses  of wine, 113 Shots of liquor per week    Comment: 01/31/2015 "4 bottles of wine & 1 fifth of vodka daily"     History  Drug Use  . 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 Marital Status: Divorced    Spouse Name: N/A  . Number of Children: N/A  . Years of Education: N/A   Social History Main Topics  . Smoking status: Never Smoker   . Smokeless tobacco: Never Used  . Alcohol Use: 162.6 oz/week    158 Glasses of wine, 113 Shots of liquor per week     Comment: 01/31/2015 "4 bottles of wine & 1 fifth of vodka daily"  . 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 Sexual Activity: Not Currently   Other Topics Concern  . None   Social History Narrative   Reports she lives with a sober roommate.    Additional Social History:                         Sleep: Fair  Appetite:  Fair  Current Medications: Current Facility-Administered Medications  Medication Dose Route Frequency Provider Last Rate Last Dose  . acamprosate (CAMPRAL) tablet 666 mg  666 mg Oral TID WC Rachael Fee, MD   666 mg at 02/07/15 1708  . acetaminophen (TYLENOL) tablet 650 mg  650 mg  Oral Q6H PRN Charm Rings, NP      . alum & mag hydroxide-simeth (MAALOX/MYLANTA) 200-200-20 MG/5ML suspension 30 mL  30 mL Oral Q4H PRN Charm Rings, NP   30 mL at 02/06/15 0816  . hydrOXYzine (ATARAX/VISTARIL) tablet 25 mg  25 mg Oral Q4H PRN Rachael Fee, MD   25 mg at 02/07/15 1604  . ibuprofen (ADVIL,MOTRIN) tablet 600 mg  600 mg Oral Q8H PRN Charm Rings, NP   600 mg at 02/07/15 0826  . lamoTRIgine (LAMICTAL) tablet 25 mg  25 mg Oral Daily Rachael Fee, MD   25 mg at 02/07/15 0826  . Levonorgestrel-Ethinyl Estradiol (AMETHIA,CAMRESE) 0.15-0.03 &0.01 MG tablet 1 tablet  1 tablet Oral Daily Charm Rings, NP   1 tablet at 02/06/15 0818  . lurasidone (LATUDA) tablet 80 mg  80 mg Oral QHS Charm Rings, NP   80 mg at 02/06/15 2129  . magnesium hydroxide (MILK OF  MAGNESIA) suspension 30 mL  30 mL Oral Daily PRN Charm Rings, NP      . multivitamin with minerals tablet 1 tablet  1 tablet Oral Daily Charm Rings, NP   1 tablet at 02/07/15 0826  . prazosin (MINIPRESS) capsule 3 mg  3 mg Oral QHS Charm Rings, NP   3 mg at 02/06/15 2129  . thiamine (VITAMIN B-1) tablet 100 mg  100 mg Oral Daily Charm Rings, NP   100 mg at 02/07/15 1610  . valACYclovir (VALTREX) tablet 500 mg  500 mg Oral BID PRN Charm Rings, NP        Lab Results:  Results for orders placed or performed during the hospital encounter of 02/05/15 (from the past 48 hour(s))  Lipid panel     Status: None   Collection Time: 02/07/15  6:50 AM  Result Value Ref Range   Cholesterol 146 0 - 200 mg/dL   Triglycerides 960 <454 mg/dL   HDL 46 >09 mg/dL   Total CHOL/HDL Ratio 3.2 RATIO   VLDL 28 0 - 40 mg/dL   LDL Cholesterol 72 0 - 99 mg/dL    Comment:        Total Cholesterol/HDL:CHD Risk Coronary Heart Disease Risk Table                     Men   Women  1/2 Average Risk   3.4   3.3  Average Risk       5.0   4.4  2 X Average Risk   9.6   7.1  3 X Average Risk  23.4   11.0        Use the calculated Patient Ratio above and the CHD Risk Table to determine the patient's CHD Risk.        ATP III CLASSIFICATION (LDL):  <100     mg/dL   Optimal  811-914  mg/dL   Near or Above                    Optimal  130-159  mg/dL   Borderline  782-956  mg/dL   High  >213     mg/dL   Very High Performed at [facility]     Physical Findings: AIMS: Facial and Oral Movements Muscles of Facial Expression: None, normal Lips and Perioral Area: None, normal Jaw: None, normal Tongue: None, normal,Extremity Movements Upper (arms, wrists, hands, fingers): None, normal Lower (legs, knees, ankles, toes):  None, normal, Trunk Movements Neck, shoulders, hips: None, normal, Overall Severity Severity of abnormal movements (highest score from questions above): None, normal Incapacitation  due to abnormal movements: None, normal Patient's awareness of abnormal movements (rate only patient's report): No Awareness, Dental Status Current problems with teeth and/or dentures?: No Does patient usually wear dentures?: No  CIWA:  CIWA-Ar Total: 1 COWS:  COWS Total Score: 6  Musculoskeletal: Strength & Muscle Tone: within normal limits Gait & Station: normal Patient leans: normal  Psychiatric Specialty Exam: Review of Systems  Constitutional: Negative.   HENT: Negative.   Eyes: Negative.   Respiratory: Negative.   Cardiovascular: Negative.   Gastrointestinal: Negative.   Genitourinary: Negative.   Musculoskeletal: Negative.   Skin: Negative.   Neurological: Negative.   Endo/Heme/Allergies: Negative.   Psychiatric/Behavioral: Positive for substance abuse. The patient is nervous/anxious.     Blood pressure 123/83, pulse 79, temperature 98 F (36.7 C), temperature source Oral, resp. rate 16, height 5\' 4"  (1.626 m), weight 59.875 kg (132 lb), SpO2 100 %.Body mass index is 22.65 kg/(m^2).  General Appearance: Fairly Groomed  Patent attorneyye Contact::  Fair  Speech:  Clear and Coherent  Volume:  fluctuates  Mood:  Anxious and worried  Affect:  anxious worried  Thought Process:  Coherent and Goal Directed  Orientation:  Full (Time, Place, and Person)  Thought Content:  symptoms events worries concerns  Suicidal Thoughts:  No  Homicidal Thoughts:  No  Memory:  Immediate;   Fair Recent;   Fair Remote;   Fair  Judgement:  Fair  Insight:  Present  Psychomotor Activity:  Restlessness  Concentration:  Fair  Recall:  FiservFair  Fund of Knowledge:Fair  Language: Fair  Akathisia:  No  Handed:  Right  AIMS (if indicated):     Assets:  Desire for Improvement Housing  ADL's:  Intact  Cognition: WNL  Sleep:  Number of Hours: 6.75   Treatment Plan Summary: Daily contact with patient to assess and evaluate symptoms and progress in treatment and Medication management Supportive  approach/coping skills Alcohol Dependence; complete the detox/work a relapse prevention plan Mood instability; pursue the Latuda at 80 mg daily Continue to work with the Lamictal Alcohol cravings; start Campral 333 mg two TID Use CBT/mindfulness Yeily Link A 02/07/2015, 6:05 PM

## 2015-02-07 NOTE — [facility] Group Notes (Signed)
[facility]   02/07/2015 11:05 AM  Participation Quality:  Appropriate  Mood/Affect:  Appropriate  Depression Rating:  2  Anxiety Rating:  7  Thoughts of Suicide:  No Will you contract for safety?   NA  Current AVH:  No  Plan for Discharge/Comments:  Pt reports that [facility] and is hoping to d/c by Thursday afternoon. She plans to attend AA as well starting on Thursday. Pt reports that she relapsed on alcohol and needs meds adjusted/bipolar sx became unmanageable.   Transportation Means:  Unknown at this time.   Supports: Family supports   Counselling psychologistmart, Conservation officer, natureHeather LCSW

## 2015-02-07 NOTE — Progress Notes (Signed)
DAR NOTE: Patient presents with anxious affect and depressed mood.  Denies auditory and visual hallucinations.  Rates depression at 2, hopelessness at 0, and anxiety at 8.  Describes energy level as normal and concentration as good.  Denies any withdrawal symptoms.  Reports physical symptoms of lightheaded, headaches and pain on self inventory form.  Maintained on routine safety checks.  Medications given as prescribed.  Support and encouragement offered as needed.  Attended group and participated.  States goal for today is "talking to the doctor about medications and let [facility] know I plan to return."  Patient observed socializing with peers in the dayroom.  Motrin 600 mg given for complain of lower back pain with good effect.  Vistaril given for complain of anxiety with good effect.

## 2015-02-07 NOTE — Progress Notes (Signed)
D: Pt has anxious affect and mood.  Pt reports she had a good visit with her friend tonight.  She reports "Dr. Sabra Heck started me on Lamictal, which was good."  Pt denies SI/HI, denies hallucinations, reports pain from headache of 6/10.  Pt has been visible in milieu interacting with peers and staff appropriately.  Pt attended evening group.   A: Met with pt and provided support and encouragement.  Actively listened to pt.  Medications administered per order.  PRN medication administered for pain and anxiety. R: Pt is compliant with medications.  Pt verbally contracts for safety.  Will continue to monitor and assess.

## 2015-02-07 NOTE — Plan of Care (Signed)
Problem: Alteration in mood Goal: LTG-Patient reports reduction in suicidal thoughts (Patient reports reduction in suicidal thoughts and is able to verbalize a safety plan for whenever patient is feeling suicidal)  Outcome: Progressing Pt denies SI and verbally contracts for safety.       

## 2015-02-08 LAB — HEMOGLOBIN A1C
Hgb A1c MFr Bld: 5.7 % — ABNORMAL HIGH (ref 4.8–5.6)
MEAN PLASMA GLUCOSE: 117 mg/dL

## 2015-02-08 LAB — PROLACTIN: PROLACTIN: 127.5 ng/mL — AB (ref 4.8–23.3)

## 2015-02-08 MED ORDER — HYDROXYZINE HCL 25 MG PO TABS: 25.0000 mg | ORAL_TABLET | ORAL | Status: DC | PRN

## 2015-02-08 MED ORDER — LURASIDONE HCL 80 MG PO TABS: 80.0000 mg | ORAL_TABLET | Freq: Every day | ORAL | Status: DC

## 2015-02-08 MED ORDER — PRAZOSIN HCL 1 MG PO CAPS: 3.0000 mg | ORAL_CAPSULE | Freq: Every day | ORAL | Status: DC

## 2015-02-08 MED ORDER — ACAMPROSATE CALCIUM 333 MG PO TBEC: 666.0000 mg | DELAYED_RELEASE_TABLET | Freq: Three times a day (TID) | ORAL | Status: DC

## 2015-02-08 MED ORDER — LAMOTRIGINE 25 MG PO TABS: 25.0000 mg | ORAL_TABLET | Freq: Every day | ORAL | Status: DC

## 2015-02-08 NOTE — Tx Team (Signed)
Interdisciplinary Treatment Plan Update (Adult)  Date:  02/08/2015  Time Reviewed:  9:46 AM   Progress in Treatment: Attending groups: Yes Participating in groups: Yes Taking medication as prescribed:  Yes. Tolerating medication:  Yes. Family/Significant othe contact made:  SPE completed with pt's mother.   Patient understands diagnosis:  Yes. and As evidenced by:  seeking treatment for medication stabilization, ETOH abuse, depression/bipolar dx.PT IVCed by [employer] after baricading herself in bathroom with gun.  Discussing patient identified problems/goals with staff:  Yes. Medical problems stabilized or resolved:  Yes. Denies suicidal/homicidal ideation: Yes. Issues/concerns per patient self-inventory:  Other  Discharge Plan or Barriers: Pt current with [facility] for medication management and SAIOP. Pt plans to resume AA groups as well.   Reason for Continuation of Hospitalization: None  Comments:  [name] Stewart is an 35 y.o. female presenting to [facility] after being petitioned for involuntary commitment by [employer]. It has been documented that pt called the suicide hotline and barricaded herself in the bathroom with a gun. Pt stated "I called a cop friend because I couldn't unload my gun". "I didn't think it was good for me to have a loaded gun in the house while I take bipolar medication". Pt reported that she open her drawer and realized that she had a loaded gun and realize that she shouldn't have one in the home. Pt denies SI, HI and AVH at this time but admitted to contacting the suicide hotline. Pt stated "I called them because I couldn't get through to the [facility] hotline". "You know I have been struggling with my drinking". Pt reported she is currently in an IOP at [facility] and shared that she also receives medication management there as well. Pt reported that she has attempted suicide in the past but was unable to provide a time frame. Pt did not report any depressive symptoms at this time.  PT reported that she continues to struggle with her alcohol use and shared that she had a drink this morning. Pt stated "I had enough to taper myself". "I am trying to stop". Pt reported that she does have a hand gun. Pt reported that she has a pending DUI with an upcoming court date in Feb. 2017.Diagnosis: Alcohol use disorder, moderate; PTSD, Bipolar   Estimated length of stay:  D/c today   Additional Comments:  Patient and CSW reviewed pt's identified goals and treatment plan. Patient verbalized understanding and agreed to treatment plan. CSW reviewed [facility] "Discharge Process and Patient Involvement" Form. Pt verbalized understanding of information provided and signed form.    Review of initial/current patient goals per problem list:  1. Goal(s): Patient will participate in aftercare plan  Met: Yes.   Target date: at discharge  As evidenced by: Patient will participate within aftercare plan AEB aftercare provider and housing plan at discharge being identified.  1/10: CSW assessing for appropriate referrals. Pt current with [facility] for Med management and SAIOP.   1/12: Pt plans to return home; follow-up at [facility] for med management and SAIOP. AA groups daily per pt.   2. Goal (s): Patient will exhibit decreased depressive symptoms and suicidal ideations.  Met: Yes   Target date: at discharge  As evidenced by: Patient will utilize self rating of depression at 3 or below and demonstrate decreased signs of depression or be deemed stable for discharge by MD.  1/10: Pt rates depression as high. Denies SI/HI/AVH at this time. Pt reports impulsivity associated with bipolar dx.  1/12: Pt rates depression as 1/10 and presents with pleasant mood/calm affect.   3. Goal(s): Patient will demonstrate decreased signs of withdrawal due to substance abuse  Met: yes.   Target date:at discharge   As evidenced by: Patient will produce a CIWA/COWS score of 0, have stable  vitals signs, and no symptoms of withdrawal.  1/10: Pt reports moderate withdrawals with CIWA score of 3 and low BP. All other vitals are stable.   1/12: Pt reports no signs of withdrawal with CIWA score of 0 and stable vitals.   Attendees: Patient:   02/08/2015 9:46 AM   Family:   02/08/2015 9:46 AM   Physician:  Dr. Carlton Adam, MD 02/08/2015 9:46 AM   Nursing:   Leisa Lenz RN 02/08/2015 9:46 AM   Clinical Social Worker: Maxie Better, LCSW 02/08/2015 9:46 AM   Clinical Social Worker: Erasmo Downer Drinkard LCSWA; Peri Maris LCSWA 02/08/2015 9:46 AM   Other:  Gerline Legacy Nurse Case Manager 02/08/2015 9:46 AM   Other:  Agustina Caroli NP   02/08/2015 9:46 AM   Other:   02/08/2015 9:46 AM   Other:  02/08/2015 9:46 AM   Other:  02/08/2015 9:46 AM   Other:  02/08/2015 9:46 AM    02/08/2015 9:46 AM    02/08/2015 9:46 AM    02/08/2015 9:46 AM    02/08/2015 9:46 AM    Scribe for Treatment Team:   Maxie Better, LCSW 02/08/2015 9:46 AM

## 2015-02-08 NOTE — Progress Notes (Signed)
  [facility] :  Will you be returning to the same living situation after discharge:  Yes,  home At discharge, do you have transportation home?: Yes,  friend coming at 2pm Do you have the ability to pay for your medications: Yes,  [employer] private insurance  Release of information consent forms completed and submitted to medical records by CSW.  Patient to Follow up at: Follow-up Information    Follow up with [facility].   Why:  Resume SAIOP group on Friday 9-11:30AM. Clinical Social Work[facility] let [facility] know that patient has been hospitalized and will return on Friday to groups.    Contact information:   [address] [place] [address] Phone: [phone] Fax: [phone]      Next level of care provider has access to [facility] Link:no  Safety Planning and Suicide Prevention discussed: Yes,  SPE completed with pt's mother.  Have you used any form of tobacco in the last 30 days? (Cigarettes, Smokeless Tobacco, Cigars, and/or Pipes): No  Has patient been referred to the Quitline?: N/A patient is not a smoker  Patient has been referred for addiction treatment: Yes-see above.   Smart, Prajwal Fellner LCSW 02/08/2015, 9:45 AM

## 2015-02-08 NOTE — Progress Notes (Signed)
Patient ID: [name] Stewart, female   DOB: 01/25/1981, 35 y.o.   MRN: [id number]  Adult Psychoeducational Group Note  Date:  02/08/2015 Time:  10:27 AM  Group Topic/Focus:  Self Esteem Action Plan:   The focus of this group is to help patients create a plan to continue to build self-esteem after discharge.  Participation Level:  Active  Participation Quality:  Appropriate  Affect:  Appropriate  Cognitive:  Appropriate  Insight: Good  Engagement in Group:  Improving  Modes of Intervention:  Discussion, Education and Support  Additional Comments:  Pt able to identify one daily goal to accomplish today.   Aurora Maskwyman, Silvana Holecek E 02/08/2015, 10:27 AM
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
## 2015-02-08 NOTE — Progress Notes (Signed)
Patient ID: [name] Stewart, female   DOB: 07/07/1980, 35 y.o.   MRN: [id number]  Pt currently presents with a flat affect and cooperative behavior. Per self inventory, pt rates depression at a 2, hopelessness 0 and anxiety 7. Pt's daily goal is "leaving and going home" and they intend to do so by "go home." Pt reports fair sleep, a good appetite, normal energy and good concentration. Pt writes "are there any side effects from the new meds that I should know?" Pt attends groups, main complaint is anxiety this morning.   Pt provided with medications per providers orders. Pt's labs and vitals were monitored throughout the day. Pt supported emotionally and encouraged to express concerns and questions. Pt educated on medications. Hydration encouraged.  Pt's safety ensured with 15 minute and environmental checks. Pt currently denies SI/HI and A/V hallucinations. Pt verbally agrees to seek staff if SI/HI or A/VH occurs and to consult with staff before acting on these thoughts. Pt to be discharged today. Will continue POC.

## 2015-02-08 NOTE — [facility] Suicide Risk Assessment (Signed)
[facility] INPATIENT:  Family/Significant Other Suicide Prevention Education  Suicide Prevention Education:  Education Completed; Lynett Grimesllen Barry (pt's mother) [phone] has been identified by the patient as the family member/significant other with whom the patient will be residing, and identified as the person(s) who will aid the patient in the event of a mental health crisis (suicidal ideations/suicide attempt).  With written consent from the patient, the family member/significant other has been provided the following suicide prevention education, prior to the and/or following the discharge of the patient.  The suicide prevention education provided includes the following:  Suicide risk factors  Suicide prevention and interventions  National Suicide Hotline telephone number  [facility] assessment telephone number  [facility] Emergency Assistance 911  [facility] and/or Residential Mobile Crisis Unit telephone number  Request made of family/significant other to:  Remove weapons (e.g., guns, rifles, knives), all items previously/currently identified as safety concern.    Remove drugs/medications (over-the-counter, prescriptions, illicit drugs), all items previously/currently identified as a safety concern.  The family member/significant other verbalizes understanding of the suicide prevention education information provided.  The family member/significant other agrees to remove the items of safety concern listed above.  Smart, Shantoria Ellwood LCSW 02/08/2015, 9:20 AM

## 2015-02-08 NOTE — Progress Notes (Signed)
D: Pt d/c home as per MD's order. Pt was picked up by family at facility entrance. A: D/C teaching done as per protocol and prescriptions reviewed with pt. All belongings in locker 29 returned to pt prior to d/c. Safety maintained on Q 15 minutes checks as ordered till time of departure from facility.  R: Pt denied SI, HI, AVH and pain at time of assessment. Pt verbalized understanding related to d/c teaching. Pt signed belonging sheet in agreement with items received. Pt was ambulatory without physical distress at time of d/c from facility.

## 2015-02-08 NOTE — Discharge Summary (Signed)
Physician Discharge Summary Note  Patient:  [name] Stewart is an 35 y.o., female MRN:  [id number] DOB:  [birth year] Patient phone:  [phone] (home)  Patient address:   [address] [place] [address],  Total Time spent with patient: 30 minutes  Date of Admission:  02/05/2015 Date of Discharge: 02/08/2015  Reason for Admission:  depression  Principal Problem: Bipolar disorder with severe depression [facility]) Discharge Diagnoses: Patient Active Problem List   Diagnosis Date Noted  . Major depressive disorder, recurrent episode, severe (HCC) [F33.2] 02/05/2015  . PTSD (post-traumatic stress disorder) [F43.10] 12/14/2014  . Bipolar disorder with severe depression (HCC) [F31.4] 12/14/2014  . Alcohol use disorder, severe, dependence (HCC) [F10.20] 12/13/2014  . Metabolic acidosis [E87.2] 01/01/2014  . Alcohol withdrawal (HCC) [F10.239] 01/01/2014  . Alcohol abuse [F10.10] 01/01/2014  . GERD (gastroesophageal reflux disease) [K21.9] 01/01/2014  . Coarse tremors [G25.2] 01/01/2014    Past Psychiatric History:  See above noted  Past Medical History:  Past Medical History  Diagnosis Date  . Alcoholic (HCC)   . Anxiety   . Post traumatic stress disorder (PTSD)   . Alcohol related seizure (HCC)   . Bipolar disorder (HCC)   . Tension headache, chronic     "monthly" (01/31/2015)  . Depression     Past Surgical History  Procedure Laterality Date  . Osteochondroma excision Right ~ 1996    "knee"  . Excisional hemorrhoidectomy  20111  . Refractive surgery Bilateral ~ 2011   Family History:  Family History  Problem Relation Age of Onset  . Anxiety disorder Mother   . Anxiety disorder Sister   . Anxiety disorder Maternal Grandmother   . Anxiety disorder Father   . Alcohol abuse Father   . Alcohol abuse Brother   . Alcohol abuse Paternal Uncle    Family Psychiatric  History:  See above noted Social History:  History  Alcohol Use  . 162.6 oz/week  . 158 Glasses of wine,  113 Shots of liquor per week    Comment: 01/31/2015 "4 bottles of wine & 1 fifth of vodka daily"     History  Drug Use  . 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 Marital Status: Divorced    Spouse Name: N/A  . Number of Children: N/A  . Years of Education: N/A   Social History Main Topics  . Smoking status: Never Smoker   . Smokeless tobacco: Never Used  . Alcohol Use: 162.6 oz/week    158 Glasses of wine, 113 Shots of liquor per week     Comment: 01/31/2015 "4 bottles of wine & 1 fifth of vodka daily"  . 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 Sexual Activity: Not Currently   Other Topics Concern  . None   Social History Narrative   Reports she lives with a sober roommate.     Hospital Course:  [name] Stewart initially presented to [facility] after being petitioned for involuntary commitment by [employer].  Pt reported that she has attempted suicide in the past but was unable to provide a time frame. Pt did not report any depressive symptoms at this time. PT reported that she continues to struggle with her alcohol use and shared that she had a drink this morning.  Pt stated "I had enough to taper myself". "I am trying to stop". Pt reported that she does have a hand gun. Pt  reported that she has a pending DUI with an upcoming court date in Feb. 2017.    [name] Stewart was admitted for Bipolar disorder with severe depression (HCC) and crisis management.  She was treated with the following medications as listed below.  [name] Stewart was discharged with current medication and was instructed on how to take medications as prescribed; (details listed below under Medication List).  Medical problems were identified and treated as needed.  Home medications were restarted as appropriate.  Improvement was monitored by observation and [name] Stewart daily report of symptom reduction.  Emotional and mental status was monitored by daily self-inventory reports completed by  [name] Stewart and clinical staff.         [name] Stewart was evaluated by the treatment team for stability and plans for continued recovery upon discharge.  [name] Stewart motivation was an integral factor for scheduling further treatment.  Employment, transportation, bed availability, health status, family support, and any pending legal issues were also considered during her hospital stay.  She was offered further treatment options upon discharge including but not limited to Residential, Intensive Outpatient, and Outpatient treatment.  [name] Stewart will follow up with the services as listed below under Follow Up Information.     Upon completion of this admission the [name] Stewart was both mentally and medically stable for discharge denying suicidal/homicidal ideation, auditory/visual/tactile hallucinations, delusional thoughts and paranoia.     Physical Findings: AIMS: Facial and Oral Movements Muscles of Facial Expression: None, normal Lips and Perioral Area: None, normal Jaw: None, normal Tongue: None, normal,Extremity Movements Upper (arms, wrists, hands, fingers): None, normal Lower (legs, knees, ankles, toes): None, normal, Trunk Movements Neck, shoulders, hips: None, normal, Overall Severity Severity of abnormal movements (highest score from questions above): None, normal Incapacitation due to abnormal movements: None, normal Patient's awareness of abnormal movements (rate only patient's report): No Awareness, Dental Status Current problems with teeth and/or dentures?: No Does patient usually wear dentures?: No  CIWA:  CIWA-Ar Total: 0 COWS:  COWS Total Score: 6  Musculoskeletal: Strength & Muscle Tone: within normal limits Gait & Station: normal Patient leans: N/A  Psychiatric Specialty Exam:  SEE MD SRA  Review of Systems  All other systems reviewed and are negative.   Blood pressure 96/62, pulse 135, temperature 99 F (37.2 C), temperature source Oral, resp. rate 16, height 5\' 4"  (1.626 m),  weight 59.875 kg (132 lb), SpO2 100 %.Body mass index is 22.65 kg/(m^2).  Have you used any form of tobacco in the last 30 days? (Cigarettes, Smokeless Tobacco, Cigars, and/or Pipes): No  Has this patient used any form of tobacco in the last 30 days? (Cigarettes, Smokeless Tobacco, Cigars, and/or Pipes) Yes, N/A  Metabolic Disorder Labs:  Lab Results  Component Value Date   HGBA1C 5.7* 02/07/2015   MPG 117 02/07/2015   Lab Results  Component Value Date   PROLACTIN 127.5* 02/07/2015   Lab Results  Component Value Date   CHOL 146 02/07/2015   TRIG 138 02/07/2015   HDL 46 02/07/2015   CHOLHDL 3.2 02/07/2015   VLDL 28 02/07/2015   LDLCALC 72 02/07/2015    See Psychiatric Specialty Exam and Suicide Risk Assessment completed by Attending Physician prior to discharge.  Discharge destination:  Home  Is patient on multiple antipsychotic therapies at discharge:  No   Has Patient had three or more failed trials of antipsychotic monotherapy by history:  No  Recommended Plan for Multiple Antipsychotic Therapies: NA  Discharge Instructions  Diet - low sodium heart healthy    Complete by:  As directed      Increase activity slowly    Complete by:  As directed             Medication List    STOP taking these medications        CAMRESE PO     chlordiazePOXIDE 25 MG capsule  Commonly known as:  LIBRIUM     clonazePAM 1 MG tablet  Commonly known as:  KLONOPIN     folic acid 1 MG tablet  Commonly known as:  FOLVITE     multivitamin-iron-minerals-folic acid chewable tablet     naltrexone 50 MG tablet  Commonly known as:  DEPADE     valACYclovir 500 MG tablet  Commonly known as:  VALTREX     Vilazodone HCl 20 MG Tabs  Commonly known as:  VIIBRYD      TAKE these medications      Indication   acamprosate 333 MG tablet  Commonly known as:  CAMPRAL  Take 2 tablets (666 mg total) by mouth 3 (three) times daily with meals.   Indication:  Excessive Use of Alcohol      lamoTRIgine 25 MG tablet  Commonly known as:  LAMICTAL  Take 1 tablet (25 mg total) by mouth daily.   Indication:  mood stabilization     lurasidone 80 MG Tabs tablet  Commonly known as:  LATUDA  Take 1 tablet (80 mg total) by mouth at bedtime.   Indication:  Depressive Phase of Manic-Depression     prazosin 1 MG capsule  Commonly known as:  MINIPRESS  Take 3 capsules (3 mg total) by mouth at bedtime.   Indication:  nightmares           Follow-up Information    Follow up with [facility].   Why:  Resume SAIOP group on Friday 9-11:30AM. Clinical Social Work[facility] let [facility] know that patient has been hospitalized and will return on Friday to groups.    Contact information:   [address]  [place] [address] Phone: [phone] Fax: [phone]      Follow-up recommendations:  Activity:  as tol Diet:  as tol  Comments:  1.  Take all your medications as prescribed.              2.  Report any adverse side effects to outpatient provider.                       3.  Patient instructed to not use alcohol or illegal drugs while on prescription medicines.            4.  In the event of worsening symptoms, instructed patient to call 911, the crisis hotline or go to nearest emergency room for evaluation of symptoms.  Signed: Velna HatchetSheila May Agustin AGNP-BC 02/08/2015, 11:25 AM  I personally assessed the patient and formulated the plan Madie RenoIrving A. Dub MikesLugo, M.D.

## 2015-02-08 NOTE — Progress Notes (Signed)
D: Patient alert and oriented x 4. Patient denies pain/SI/HI/AVH. Patient reports she is ready to go home tomorrow.  A: Staff to monitor Q 15 mins for safety. Encouragement and support offered. Scheduled medications administered per orders. R: Patient remains safe on the unit. Patient attended group tonight. Patient visible on hte unit and interacting with peers. Patient taking administered medications.

## 2015-02-17 ENCOUNTER — Emergency Department (HOSPITAL_COMMUNITY): Payer: Federal, State, Local not specified - PPO

## 2015-02-17 ENCOUNTER — Encounter (HOSPITAL_COMMUNITY): Payer: Self-pay | Admitting: Emergency Medicine

## 2015-02-17 ENCOUNTER — Observation Stay (HOSPITAL_COMMUNITY)
Admission: EM | Admit: 2015-02-17 | Discharge: 2015-02-19 | Disposition: A | Discharge status: Home / Self Care | Payer: Federal, State, Local not specified - PPO | Attending: Oncology | Admitting: Oncology

## 2015-02-17 DIAGNOSIS — F10230 Alcohol dependence with withdrawal, uncomplicated: Secondary | ICD-10-CM | POA: Insufficient documentation

## 2015-02-17 DIAGNOSIS — F319 Bipolar disorder, unspecified: Secondary | ICD-10-CM | POA: Insufficient documentation

## 2015-02-17 DIAGNOSIS — F10229 Alcohol dependence with intoxication, unspecified: Secondary | ICD-10-CM | POA: Insufficient documentation

## 2015-02-17 DIAGNOSIS — F4310 Post-traumatic stress disorder, unspecified: Secondary | ICD-10-CM | POA: Insufficient documentation

## 2015-02-17 DIAGNOSIS — W19XXXA Unspecified fall, initial encounter: Secondary | ICD-10-CM | POA: Insufficient documentation

## 2015-02-17 DIAGNOSIS — R7401 Elevation of levels of liver transaminase levels: Secondary | ICD-10-CM | POA: Diagnosis present

## 2015-02-17 DIAGNOSIS — F3170 Bipolar disorder, currently in remission, most recent episode unspecified: Secondary | ICD-10-CM | POA: Insufficient documentation

## 2015-02-17 DIAGNOSIS — F10239 Alcohol dependence with withdrawal, unspecified: Principal | ICD-10-CM | POA: Insufficient documentation

## 2015-02-17 DIAGNOSIS — Y908 Blood alcohol level of 240 mg/100 ml or more: Secondary | ICD-10-CM | POA: Insufficient documentation

## 2015-02-17 DIAGNOSIS — S0083XA Contusion of other part of head, initial encounter: Secondary | ICD-10-CM | POA: Insufficient documentation

## 2015-02-17 DIAGNOSIS — S40021A Contusion of right upper arm, initial encounter: Secondary | ICD-10-CM | POA: Insufficient documentation

## 2015-02-17 DIAGNOSIS — R740 Nonspecific elevation of levels of transaminase and lactic acid dehydrogenase [LDH]: Secondary | ICD-10-CM | POA: Insufficient documentation

## 2015-02-17 DIAGNOSIS — F411 Generalized anxiety disorder: Secondary | ICD-10-CM | POA: Insufficient documentation

## 2015-02-17 LAB — CBC WITH DIFFERENTIAL/PLATELET
BASOS PCT: 3 %
Basophils Absolute: 0.2 10*3/uL — ABNORMAL HIGH (ref 0.0–0.1)
EOS ABS: 0.2 10*3/uL (ref 0.0–0.7)
EOS PCT: 3 %
HCT: 46 % (ref 36.0–46.0)
HEMOGLOBIN: 15.9 g/dL — AB (ref 12.0–15.0)
LYMPHS PCT: 60 %
Lymphs Abs: 3.6 10*3/uL (ref 0.7–4.0)
MCH: 30.8 pg (ref 26.0–34.0)
MCHC: 34.6 g/dL (ref 30.0–36.0)
MCV: 89.1 fL (ref 78.0–100.0)
MONO ABS: 0.4 10*3/uL (ref 0.1–1.0)
Monocytes Relative: 6 %
NEUTROS PCT: 28 %
Neutro Abs: 1.7 10*3/uL (ref 1.7–7.7)
PLATELETS: 400 10*3/uL (ref 150–400)
RBC: 5.16 MIL/uL — AB (ref 3.87–5.11)
RDW: 12.9 % (ref 11.5–15.5)
WBC: 6.1 10*3/uL (ref 4.0–10.5)

## 2015-02-17 LAB — COMPREHENSIVE METABOLIC PANEL
ALT: 667 U/L — ABNORMAL HIGH (ref 14–54)
ANION GAP: 15 (ref 5–15)
AST: 422 U/L — ABNORMAL HIGH (ref 15–41)
Albumin: 4.4 g/dL (ref 3.5–5.0)
Alkaline Phosphatase: 45 U/L (ref 38–126)
BUN: 14 mg/dL (ref 6–20)
CHLORIDE: 103 mmol/L (ref 101–111)
CO2: 27 mmol/L (ref 22–32)
Calcium: 8.7 mg/dL — ABNORMAL LOW (ref 8.9–10.3)
Creatinine, Ser: 0.59 mg/dL (ref 0.44–1.00)
GFR calc non Af Amer: >60 mL/min (ref >60)
Glucose, Bld: 83 mg/dL (ref 65–99)
Potassium: 3.8 mmol/L (ref 3.5–5.1)
SODIUM: 145 mmol/L (ref 135–145)
Total Bilirubin: 0.6 mg/dL (ref 0.3–1.2)
Total Protein: 8.3 g/dL — ABNORMAL HIGH (ref 6.5–8.1)

## 2015-02-17 LAB — RAPID URINE DRUG SCREEN, HOSP PERFORMED
Amphetamines: NOT DETECTED
BENZODIAZEPINES: POSITIVE — AB
Barbiturates: NOT DETECTED
COCAINE: NOT DETECTED
Opiates: NOT DETECTED
Tetrahydrocannabinol: NOT DETECTED

## 2015-02-17 LAB — URINALYSIS, ROUTINE W REFLEX MICROSCOPIC
Bilirubin Urine: NEGATIVE
GLUCOSE, UA: NEGATIVE mg/dL
Ketones, ur: NEGATIVE mg/dL
Nitrite: NEGATIVE
Protein, ur: 30 mg/dL — AB
SPECIFIC GRAVITY, URINE: 1.014 (ref 1.005–1.030)
pH: 6 (ref 5.0–8.0)

## 2015-02-17 LAB — ETHANOL: Alcohol, Ethyl (B): 385 mg/dL (ref <5)

## 2015-02-17 LAB — URINE MICROSCOPIC-ADD ON

## 2015-02-17 LAB — SALICYLATE LEVEL

## 2015-02-17 LAB — ACETAMINOPHEN LEVEL

## 2015-02-17 LAB — POC URINE PREG, ED: PREG TEST UR: NEGATIVE

## 2015-02-17 MED ORDER — SODIUM CHLORIDE 0.9 % IV BOLUS (SEPSIS)
1000.0000 mL | Freq: Once | INTRAVENOUS | Status: AC
  Administered 2015-02-17: 1000 mL via INTRAVENOUS

## 2015-02-17 MED ORDER — LORAZEPAM 0.5 MG PO TABS
0.5000 mg | ORAL_TABLET | Freq: Once | ORAL | Status: AC
  Administered 2015-02-17: 0.5 mg via ORAL
  Filled 2015-02-17: qty 1

## 2015-02-17 NOTE — ED Notes (Signed)
MD at bedside. 

## 2015-02-17 NOTE — ED Notes (Signed)
Pt BIB EMS for alcohol intoxication; pt called EMS because she was "too drunk"; pt's father is encouraging her to get help for alcoholism; pt oriented in triage.

## 2015-02-17 NOTE — ED Provider Notes (Signed)
CSN: [id number]     Arrival date & time 02/17/15  2000 History   First MD Initiated Contact with Patient 02/17/15 2009     Chief Complaint  Patient presents with  . Alcohol Intoxication     (Consider location/radiation/quality/duration/timing/severity/associated sxs/prior Treatment) HPI   Patient is a 35 year old female with history of PTSD, depression, bipolar disorder, chronic alcohol use and abuse presenting today with alcohol intoxication. Patient reports she's "drink all day". She also reports that she may have fell. She has a small bruise to her forehead.  Level V caveat altered mental status.  Past Medical History  Diagnosis Date  . Alcoholic (HCC)   . Anxiety   . Post traumatic stress disorder (PTSD)   . Alcohol related seizure (HCC)   . Bipolar disorder (HCC)   . Tension headache, chronic     "monthly" (01/31/2015)  . Depression    Past Surgical History  Procedure Laterality Date  . Osteochondroma excision Right ~ 1996    "knee"  . Excisional hemorrhoidectomy  20111  . Refractive surgery Bilateral ~ 2011   Family History  Problem Relation Age of Onset  . Anxiety disorder Mother   . Anxiety disorder Sister   . Anxiety disorder Maternal Grandmother   . Anxiety disorder Father   . Alcohol abuse Father   . Alcohol abuse Brother   . Alcohol abuse Paternal Uncle    Social History  Substance Use Topics  . Smoking status: Never Smoker   . Smokeless tobacco: Never Used  . Alcohol Use: 162.6 oz/week    158 Glasses of wine, 113 Shots of liquor per week     Comment: 01/31/2015 "4 bottles of wine & 1 fifth of vodka daily"   OB History    No data available     Review of Systems  Unable to perform ROS: Mental status change      Allergies  Naltrexone and Baclofen  Home Medications   Prior to Admission medications   Medication Sig Start Date End Date Taking? Authorizing Provider  acamprosate (CAMPRAL) 333 MG tablet Take 2 tablets (666 mg total) by mouth 3  (three) times daily with meals. 02/08/15  Yes Adonis Brook, NP  lamoTRIgine (LAMICTAL) 25 MG tablet Take 1 tablet (25 mg total) by mouth daily. 02/08/15  Yes Adonis Brook, NP  lurasidone (LATUDA) 80 MG TABS tablet Take 1 tablet (80 mg total) by mouth at bedtime. 02/08/15  Yes Adonis Brook, NP  methylPREDNISolone (MEDROL DOSEPAK) 4 MG TBPK tablet TK UTD 02/10/15  Yes Historical Provider, MD  prazosin (MINIPRESS) 1 MG capsule Take 3 capsules (3 mg total) by mouth at bedtime. 02/08/15  Yes Adonis Brook, NP  hydrOXYzine (ATARAX/VISTARIL) 25 MG tablet Take 1 tablet (25 mg total) by mouth every 4 (four) hours as needed for anxiety. 02/08/15   Adonis Brook, NP   BP 113/77 mmHg  Pulse 97  Temp(Src) 97.6 F (36.4 C) (Oral)  Resp 18  SpO2 92% Physical Exam  Constitutional: She appears well-developed and well-nourished.  HENT:  Head: Normocephalic.  Small bruise to forehead.  Eyes: Right eye exhibits no discharge.  Cardiovascular: Normal rate, regular rhythm and normal heart sounds.   No murmur heard. Pulmonary/Chest: Effort normal and breath sounds normal. She has no wheezes. She has no rales.  Abdominal: Soft. She exhibits no distension. There is no tenderness.  Neurological:  Behavior consistent with intoxication.  Skin: Skin is warm and dry. She is not diaphoretic.  Nursing note and vitals reviewed.  ED Course  Procedures (including critical care time) Labs Review Labs Reviewed  CBC WITH DIFFERENTIAL/PLATELET - Abnormal; Notable for the following:    RBC 5.16 (*)    Hemoglobin 15.9 (*)    Basophils Absolute 0.2 (*)    All other components within normal limits  COMPREHENSIVE METABOLIC PANEL - Abnormal; Notable for the following:    Calcium 8.7 (*)    Total Protein 8.3 (*)    AST 422 (*)    ALT 667 (*)    All other components within normal limits  ETHANOL - Abnormal; Notable for the following:    Alcohol, Ethyl (B) 385 (*)    All other components within normal limits   ACETAMINOPHEN LEVEL - Abnormal; Notable for the following:    Acetaminophen (Tylenol), Serum <10 (*)    All other components within normal limits  URINE RAPID DRUG SCREEN, HOSP PERFORMED - Abnormal; Notable for the following:    Benzodiazepines POSITIVE (*)    All other components within normal limits  URINALYSIS, ROUTINE W REFLEX MICROSCOPIC (NOT AT [facility]) - Abnormal; Notable for the following:    APPearance CLOUDY (*)    Hgb urine dipstick SMALL (*)    Protein, ur 30 (*)    Leukocytes, UA TRACE (*)    All other components within normal limits  URINE MICROSCOPIC-ADD ON - Abnormal; Notable for the following:    Squamous Epithelial / LPF 6-30 (*)    Bacteria, UA FEW (*)    Casts HYALINE CASTS (*)    All other components within normal limits  URINE CULTURE  SALICYLATE LEVEL  POC URINE PREG, ED    Imaging Review Ct Head Wo Contrast  02/17/2015  CLINICAL DATA:  Question of fall; intoxication. Concern for head or cervical spine injury. Initial encounter. EXAM: CT HEAD WITHOUT CONTRAST CT CERVICAL SPINE WITHOUT CONTRAST TECHNIQUE: Multidetector CT imaging of the head and cervical spine was performed following the standard protocol without intravenous contrast. Multiplanar CT image reconstructions of the cervical spine were also generated. COMPARISON:  CT of the head and cervical spine performed 12/13/2014 FINDINGS: CT HEAD FINDINGS There is no evidence of acute infarction, mass lesion, or intra- or extra-axial hemorrhage on CT. The posterior fossa, including the cerebellum, brainstem and fourth ventricle, is within normal limits. The third and lateral ventricles, and basal ganglia are unremarkable in appearance. The cerebral hemispheres are symmetric in appearance, with normal gray-white differentiation. No mass effect or midline shift is seen. There is no evidence of fracture; visualized osseous structures are unremarkable in appearance. The orbits are within normal limits. The paranasal sinuses  and mastoid air cells are well-aerated. Mild soft tissue swelling is suggested overlying the frontal calvarium. A metallic piercing is seen at the left nostril. CT CERVICAL SPINE FINDINGS There is no evidence of fracture or subluxation. Vertebral bodies demonstrate normal height and alignment. Intervertebral disc spaces are preserved. Prevertebral soft tissues are within normal limits. The visualized neural foramina are grossly unremarkable. The thyroid gland is unremarkable in appearance. The visualized lung apices are clear. No significant soft tissue abnormalities are seen. IMPRESSION: 1. No evidence of traumatic intracranial injury or fracture. 2. No evidence of fracture or subluxation along the cervical spine. 3. Mild soft tissue swelling suggested overlying the frontal calvarium. Electronically Signed   By: Roanna Raider M.D.   On: 02/17/2015 22:10   Ct Cervical Spine Wo Contrast  02/17/2015  CLINICAL DATA:  Question of fall; intoxication. Concern for head or cervical spine injury. Initial encounter.  EXAM: CT HEAD WITHOUT CONTRAST CT CERVICAL SPINE WITHOUT CONTRAST TECHNIQUE: Multidetector CT imaging of the head and cervical spine was performed following the standard protocol without intravenous contrast. Multiplanar CT image reconstructions of the cervical spine were also generated. COMPARISON:  CT of the head and cervical spine performed 12/13/2014 FINDINGS: CT HEAD FINDINGS There is no evidence of acute infarction, mass lesion, or intra- or extra-axial hemorrhage on CT. The posterior fossa, including the cerebellum, brainstem and fourth ventricle, is within normal limits. The third and lateral ventricles, and basal ganglia are unremarkable in appearance. The cerebral hemispheres are symmetric in appearance, with normal gray-white differentiation. No mass effect or midline shift is seen. There is no evidence of fracture; visualized osseous structures are unremarkable in appearance. The orbits are within  normal limits. The paranasal sinuses and mastoid air cells are well-aerated. Mild soft tissue swelling is suggested overlying the frontal calvarium. A metallic piercing is seen at the left nostril. CT CERVICAL SPINE FINDINGS There is no evidence of fracture or subluxation. Vertebral bodies demonstrate normal height and alignment. Intervertebral disc spaces are preserved. Prevertebral soft tissues are within normal limits. The visualized neural foramina are grossly unremarkable. The thyroid gland is unremarkable in appearance. The visualized lung apices are clear. No significant soft tissue abnormalities are seen. IMPRESSION: 1. No evidence of traumatic intracranial injury or fracture. 2. No evidence of fracture or subluxation along the cervical spine. 3. Mild soft tissue swelling suggested overlying the frontal calvarium. Electronically Signed   By: Roanna Raider M.D.   On: 02/17/2015 22:10   I have personally reviewed and evaluated these images and lab results as part of my medical decision-making.   EKG Interpretation None      MDM   Final diagnoses:  None   patient is a 35 year old female with history of PTSD, depression, alcohol abuse, recent psychiatric hospitilization. She's presented today with alcohol intoxication. Patient has an unreliable historian at this time. We will do CT head and neck given the small bruise on her forehead. We will do psychiatric labs. Patient will need to sober up and we will check SI or HI at that time.  10:47 PM Patient's labs just returned. She has elevated AST and ALT in the 400-600 range. This is new from 14 days ago. We'll send acute hepatitis panel. No evidence of obstruction. Patient has been admitted multiple times for DTs in the past. Given her level of intoxication I suspect this may happen again today. Given the need for workup of the new transaminitis will admit back to inpatient hospital team that she was discharged from earlier this month.  Courteney  Randall An, MD 02/17/15 [phone]

## 2015-02-17 NOTE — ED Notes (Signed)
Delay in blood draw because IV did not draw back enough blood for tubes

## 2015-02-17 NOTE — ED Notes (Signed)
Critical value, ETOH 385

## 2015-02-17 NOTE — ED Notes (Signed)
Two unsuccessful IV attempts by this nurse

## 2015-02-18 DIAGNOSIS — F10239 Alcohol dependence with withdrawal, unspecified: Secondary | ICD-10-CM | POA: Diagnosis not present

## 2015-02-18 DIAGNOSIS — X58XXXD Exposure to other specified factors, subsequent encounter: Secondary | ICD-10-CM

## 2015-02-18 DIAGNOSIS — F319 Bipolar disorder, unspecified: Secondary | ICD-10-CM | POA: Diagnosis not present

## 2015-02-18 DIAGNOSIS — F4310 Post-traumatic stress disorder, unspecified: Secondary | ICD-10-CM | POA: Diagnosis not present

## 2015-02-18 DIAGNOSIS — F10129 Alcohol abuse with intoxication, unspecified: Secondary | ICD-10-CM | POA: Diagnosis not present

## 2015-02-18 DIAGNOSIS — R740 Nonspecific elevation of levels of transaminase and lactic acid dehydrogenase [LDH]: Secondary | ICD-10-CM | POA: Diagnosis not present

## 2015-02-18 DIAGNOSIS — F4312 Post-traumatic stress disorder, chronic: Secondary | ICD-10-CM | POA: Diagnosis not present

## 2015-02-18 LAB — COMPREHENSIVE METABOLIC PANEL
ALBUMIN: 3.4 g/dL — AB (ref 3.5–5.0)
ALT: 494 U/L — AB (ref 14–54)
AST: 282 U/L — AB (ref 15–41)
Alkaline Phosphatase: 36 U/L — ABNORMAL LOW (ref 38–126)
Anion gap: 12 (ref 5–15)
BUN: 11 mg/dL (ref 6–20)
CHLORIDE: 102 mmol/L (ref 101–111)
CO2: 26 mmol/L (ref 22–32)
CREATININE: 0.62 mg/dL (ref 0.44–1.00)
Calcium: 8.1 mg/dL — ABNORMAL LOW (ref 8.9–10.3)
GFR calc Af Amer: >60 mL/min (ref >60)
GFR calc non Af Amer: >60 mL/min (ref >60)
GLUCOSE: 105 mg/dL — AB (ref 65–99)
POTASSIUM: 3.1 mmol/L — AB (ref 3.5–5.1)
Sodium: 140 mmol/L (ref 135–145)
Total Bilirubin: 0.8 mg/dL (ref 0.3–1.2)
Total Protein: 6.9 g/dL (ref 6.5–8.1)

## 2015-02-18 LAB — ACETAMINOPHEN LEVEL

## 2015-02-18 LAB — MAGNESIUM: Magnesium: 1.7 mg/dL (ref 1.7–2.4)

## 2015-02-18 MED ORDER — FOLIC ACID 1 MG PO TABS
1.0000 mg | ORAL_TABLET | Freq: Every day | ORAL | Status: DC
  Administered 2015-02-18 – 2015-02-19 (×2): 1 mg via ORAL
  Filled 2015-02-18 (×2): qty 1

## 2015-02-18 MED ORDER — ONDANSETRON HCL 4 MG PO TABS: 4.0000 mg | ORAL_TABLET | Freq: Four times a day (QID) | ORAL | Status: DC | PRN

## 2015-02-18 MED ORDER — CHLORDIAZEPOXIDE HCL 25 MG PO CAPS
50.0000 mg | ORAL_CAPSULE | Freq: Three times a day (TID) | ORAL | Status: DC
  Administered 2015-02-18 – 2015-02-19 (×5): 50 mg via ORAL
  Filled 2015-02-18 (×2): qty 2
  Filled 2015-02-18 (×3): qty 10

## 2015-02-18 MED ORDER — LAMOTRIGINE 25 MG PO TABS: 25.0000 mg | ORAL_TABLET | Freq: Every day | ORAL | Status: DC

## 2015-02-18 MED ORDER — IBUPROFEN 800 MG PO TABS
800.0000 mg | ORAL_TABLET | Freq: Once | ORAL | Status: AC
  Administered 2015-02-18: 800 mg via ORAL
  Filled 2015-02-18: qty 1

## 2015-02-18 MED ORDER — LORAZEPAM 2 MG/ML IJ SOLN
1.0000 mg | Freq: Four times a day (QID) | INTRAMUSCULAR | Status: DC | PRN
  Administered 2015-02-18: 1 mg via INTRAVENOUS
  Filled 2015-02-18: qty 1

## 2015-02-18 MED ORDER — SODIUM CHLORIDE 0.9 % IV SOLN
INTRAVENOUS | Status: AC
  Administered 2015-02-18: 07:00:00 via INTRAVENOUS

## 2015-02-18 MED ORDER — THIAMINE HCL 100 MG/ML IJ SOLN
100.0000 mg | Freq: Every day | INTRAMUSCULAR | Status: DC
  Administered 2015-02-18: 100 mg via INTRAVENOUS
  Filled 2015-02-18: qty 2

## 2015-02-18 MED ORDER — LURASIDONE HCL 80 MG PO TABS
80.0000 mg | ORAL_TABLET | Freq: Every day | ORAL | Status: DC
  Administered 2015-02-18: 80 mg via ORAL
  Filled 2015-02-18 (×2): qty 1

## 2015-02-18 MED ORDER — POTASSIUM CHLORIDE CRYS ER 20 MEQ PO TBCR
40.0000 meq | EXTENDED_RELEASE_TABLET | Freq: Once | ORAL | Status: AC
  Administered 2015-02-18: 40 meq via ORAL
  Filled 2015-02-18: qty 2

## 2015-02-18 MED ORDER — PRAZOSIN HCL 2 MG PO CAPS
3.0000 mg | ORAL_CAPSULE | Freq: Every day | ORAL | Status: DC
  Administered 2015-02-18: 3 mg via ORAL
  Filled 2015-02-18 (×2): qty 1

## 2015-02-18 MED ORDER — ENOXAPARIN SODIUM 40 MG/0.4ML ~~LOC~~ SOLN
40.0000 mg | Freq: Every day | SUBCUTANEOUS | Status: DC
  Administered 2015-02-18 – 2015-02-19 (×2): 40 mg via SUBCUTANEOUS
  Filled 2015-02-18 (×2): qty 0.4

## 2015-02-18 MED ORDER — ADULT MULTIVITAMIN W/MINERALS CH
1.0000 | ORAL_TABLET | Freq: Every day | ORAL | Status: DC
  Administered 2015-02-18 – 2015-02-19 (×2): 1 via ORAL
  Filled 2015-02-18 (×2): qty 1

## 2015-02-18 MED ORDER — LORAZEPAM 1 MG PO TABS
1.0000 mg | ORAL_TABLET | Freq: Four times a day (QID) | ORAL | Status: DC | PRN
  Administered 2015-02-18 – 2015-02-19 (×3): 1 mg via ORAL
  Filled 2015-02-18 (×3): qty 1

## 2015-02-18 MED ORDER — ACAMPROSATE CALCIUM 333 MG PO TBEC
666.0000 mg | DELAYED_RELEASE_TABLET | Freq: Three times a day (TID) | ORAL | Status: DC
  Administered 2015-02-18 – 2015-02-19 (×5): 666 mg via ORAL
  Filled 2015-02-18 (×7): qty 2

## 2015-02-18 MED ORDER — ONDANSETRON HCL 4 MG/2ML IJ SOLN
4.0000 mg | Freq: Four times a day (QID) | INTRAMUSCULAR | Status: DC | PRN
  Administered 2015-02-18: 4 mg via INTRAVENOUS
  Filled 2015-02-18: qty 2

## 2015-02-18 NOTE — H&P (Signed)
Date: 02/18/2015               Patient Name:  [name] Stewart MRN: [id number]  DOB: [birth year] Age / Sex: 35 y.o., female   PCP: Verlon Au, MD         Medical Service: Internal Medicine Teaching Service         Attending Physician: Dr. Inez Catalina, MD    First Contact: Dr. Earnest Conroy Pager: [phone]  Second Contact: Dr. Beckie Salts Pager: [phone]       After Hours (After 5p/  First Contact Pager: [phone]  weekends / holidays): Second Contact Pager: [phone]   Chief Complaint: alcohol intoxication  History of Present Illness: [name] Stewart is a 35 yo female with PTSD, depression, bipolar I, and h/o alcohol abuse, presenting with alcohol intoxication. She presented to [facility] ED because she was "drinking too much," and "was unable to stop safely."  She reports drinking at least a fifth of liquor daily for the last 5-6 days.  Her last drink was approximately 8 pm yesterday.  She currently denies hallucinations, but has a slight tremor.  She has had tactile hallucinations in the past, but denies withdrawal seizures.   She was recently discharged from [facility] for bipolar with severe depression and crisis management.  She was discharged on Acamprosate, which she has not been taking. She was also discharged on Lamotrigine, Lurasidone, and Prazosin, for which she reports adherence.  She denies the use of other illicit substances.  She denies SI/HI. Her mood is "I don't know."  She reports having fallen while intoxicated yesterday, sustaining a bruise to RUE and small abrasion to her forehead.  CT head and C-spine were negative, except for soft tissue swelling over frontal calvarium.   She was also noted to have elevated AST/ALT to 422/667.      Meds: Current Facility-Administered Medications  Medication Dose Route Frequency Provider Last Rate Last Dose  . enoxaparin (LOVENOX) injection 40 mg  40 mg Subcutaneous Daily Rushil Terrilee Croak, MD      . folic acid (FOLVITE) tablet 1 mg  1 mg Oral Daily  Rushil Terrilee Croak, MD      . lamoTRIgine (LAMICTAL) tablet 25 mg  25 mg Oral Daily Rushil Patel V, MD      . lurasidone (LATUDA) tablet 80 mg  80 mg Oral QHS Rushil Patel V, MD      . multivitamin with minerals tablet 1 tablet  1 tablet Oral Daily Rushil Patel V, MD      . ondansetron (ZOFRAN) tablet 4 mg  4 mg Oral Q6H PRN Rushil Terrilee Croak, MD       Or  . ondansetron (ZOFRAN) injection 4 mg  4 mg Intravenous Q6H PRN Rushil Patel V, MD      . thiamine (B-1) injection 100 mg  100 mg Intravenous Daily Rushil Terrilee Croak, MD        Allergies: Allergies as of 02/17/2015 - Review Complete 02/17/2015  Allergen Reaction Noted  . Naltrexone Other (See Comments) 06/16/2014  . Baclofen Rash 06/16/2014   Past Medical History  Diagnosis Date  . Alcoholic (HCC)   . Anxiety   . Post traumatic stress disorder (PTSD)   . Alcohol related seizure (HCC)   . Bipolar disorder (HCC)   . Tension headache, chronic     "monthly" (01/31/2015)  . Depression    Past Surgical History  Procedure Laterality Date  . Osteochondroma excision Right ~ 1996    "knee"  .  Excisional hemorrhoidectomy  20111  . Refractive surgery Bilateral ~ 2011   Family History  Problem Relation Age of Onset  . Anxiety disorder Mother   . Anxiety disorder Sister   . Anxiety disorder Maternal Grandmother   . Anxiety disorder Father   . Alcohol abuse Father   . Alcohol abuse Brother   . Alcohol abuse Paternal Uncle    Social History   Social History  . Marital Status: Divorced    Spouse Name: N/A  . Number of Children: N/A  . Years of Education: N/A   Occupational History  . Not on file.   Social History Main Topics  . Smoking status: Never Smoker   . Smokeless tobacco: Never Used  . Alcohol Use: 162.6 oz/week    158 Glasses of wine, 113 Shots of liquor per week     Comment: 01/31/2015 "4 bottles of wine & 1 fifth of vodka daily"  . 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 Sexual Activity: Not Currently    Other Topics Concern  . Not on file   Social History Narrative   Reports she lives with a sober roommate.     Review of Systems: Pertinent items noted in HPI and remainder of comprehensive ROS otherwise negative.  Physical Exam: Blood pressure 100/63, pulse 95, temperature 98 F (36.7 C), temperature source Oral, resp. rate 17, height  (1.626 m), weight 134 lb 7.7 oz (61 kg), SpO2 100 %. Physical Exam  Constitutional: She is oriented to person, place, and time and well-developed, well-nourished, and in no distress. No distress.  HENT:  Head: Normocephalic and atraumatic.  1 cm superficial abrasion to left frontal forehead.  No scabbing or drainage.  Eyes: EOM are normal. No scleral icterus.  Neck: No JVD present. No tracheal deviation present.  Cardiovascular: Normal rate, regular rhythm, normal heart sounds and intact distal pulses.   Pulmonary/Chest: Effort normal and breath sounds normal. No stridor. No respiratory distress. She has no wheezes.  Abdominal: Soft. She exhibits no distension. There is no tenderness. There is no rebound and no guarding.  Musculoskeletal: She exhibits no edema.  Neurological: She is oriented to person, place, and time.  Somnolent, but easily arouseable.  Slowed speech, but without slurring or dysarthria.  Answers questions appropriately.  Skin: Skin is warm and dry. No rash noted. She is not diaphoretic.  Psychiatric:  Mood: "I don't know." Affect: Flat     Lab results: Basic Metabolic Panel:  Recent Labs  57/84/69 2137  NA 145  K 3.8  CL 103  CO2 27  GLUCOSE 83  BUN 14  CREATININE 0.59  CALCIUM 8.7*   Liver Function Tests:  Recent Labs  02/17/15 2137  AST 422*  ALT 667*  ALKPHOS 45  BILITOT 0.6  PROT 8.3*  ALBUMIN 4.4   No results for input(s): LIPASE, AMYLASE in the last 72 hours. No results for input(s): AMMONIA in the last 72 hours. CBC:  Recent Labs  02/17/15 2137  WBC 6.1  NEUTROABS 1.7  HGB 15.9*   HCT 46.0  MCV 89.1  PLT 400   Cardiac Enzymes: No results for input(s): CKTOTAL, CKMB, CKMBINDEX, TROPONINI in the last 72 hours. BNP: No results for input(s): PROBNP in the last 72 hours. D-Dimer: No results for input(s): DDIMER in the last 72 hours. CBG: No results for input(s): GLUCAP in the last 72 hours. Hemoglobin A1C: No results for input(s): HGBA1C in the last 72 hours.  Fasting Lipid Panel: No results for input(s): CHOL, HDL, LDLCALC, TRIG, CHOLHDL, LDLDIRECT in the last 72 hours. Thyroid Function Tests: No results for input(s): TSH, T4TOTAL, FREET4, T3FREE, THYROIDAB in the last 72 hours. Anemia Panel: No results for input(s): VITAMINB12, FOLATE, FERRITIN, TIBC, IRON, RETICCTPCT in the last 72 hours. Coagulation: No results for input(s): LABPROT, INR in the last 72 hours. Urine Drug Screen: Drugs of Abuse     Component Value Date/Time   LABOPIA NONE DETECTED 02/17/2015 2054   COCAINSCRNUR NONE DETECTED 02/17/2015 2054   LABBENZ POSITIVE* 02/17/2015 2054   AMPHETMU NONE DETECTED 02/17/2015 2054   THCU NONE DETECTED 02/17/2015 2054   LABBARB NONE DETECTED 02/17/2015 2054    Alcohol Level:  Recent Labs  02/17/15 2137  ETH 385*   Urinalysis:  Recent Labs  02/17/15 2054  COLORURINE YELLOW  LABSPEC 1.014  PHURINE 6.0  GLUCOSEU NEGATIVE  HGBUR SMALL*  BILIRUBINUR NEGATIVE  KETONESUR NEGATIVE  PROTEINUR 30*  NITRITE NEGATIVE  LEUKOCYTESUR TRACE*   Misc. Labs:   Imaging results:  Ct Head Wo Contrast  02/17/2015  CLINICAL DATA:  Question of fall; intoxication. Concern for head or cervical spine injury. Initial encounter. EXAM: CT HEAD WITHOUT CONTRAST CT CERVICAL SPINE WITHOUT CONTRAST TECHNIQUE: Multidetector CT imaging of the head and cervical spine was performed following the standard protocol without intravenous contrast. Multiplanar CT image reconstructions of the cervical spine were also generated. COMPARISON:  CT of the head and cervical spine  performed 12/13/2014 FINDINGS: CT HEAD FINDINGS There is no evidence of acute infarction, mass lesion, or intra- or extra-axial hemorrhage on CT. The posterior fossa, including the cerebellum, brainstem and fourth ventricle, is within normal limits. The third and lateral ventricles, and basal ganglia are unremarkable in appearance. The cerebral hemispheres are symmetric in appearance, with normal gray-white differentiation. No mass effect or midline shift is seen. There is no evidence of fracture; visualized osseous structures are unremarkable in appearance. The orbits are within normal limits. The paranasal sinuses and mastoid air cells are well-aerated. Mild soft tissue swelling is suggested overlying the frontal calvarium. A metallic piercing is seen at the left nostril. CT CERVICAL SPINE FINDINGS There is no evidence of fracture or subluxation. Vertebral bodies demonstrate normal height and alignment. Intervertebral disc spaces are preserved. Prevertebral soft tissues are within normal limits. The visualized neural foramina are grossly unremarkable. The thyroid gland is unremarkable in appearance. The visualized lung apices are clear. No significant soft tissue abnormalities are seen. IMPRESSION: 1. No evidence of traumatic intracranial injury or fracture. 2. No evidence of fracture or subluxation along the cervical spine. 3. Mild soft tissue swelling suggested overlying the frontal calvarium. Electronically Signed   By: Roanna Raider M.D.   On: 02/17/2015 22:10   Ct Cervical Spine Wo Contrast  02/17/2015  CLINICAL DATA:  Question of fall; intoxication. Concern for head or cervical spine injury. Initial encounter. EXAM: CT HEAD WITHOUT CONTRAST CT CERVICAL SPINE WITHOUT CONTRAST TECHNIQUE: Multidetector CT imaging of the head and cervical spine was performed following the standard protocol without intravenous contrast. Multiplanar CT image reconstructions of the cervical spine were also generated.  COMPARISON:  CT of the head and cervical spine performed 12/13/2014 FINDINGS: CT HEAD FINDINGS There is no evidence of acute infarction, mass lesion, or intra- or extra-axial hemorrhage on CT. The posterior fossa, including the cerebellum, brainstem and fourth ventricle, is within normal limits. The third and lateral ventricles, and basal ganglia are unremarkable in appearance. The cerebral hemispheres are symmetric in  appearance, with normal gray-white differentiation. No mass effect or midline shift is seen. There is no evidence of fracture; visualized osseous structures are unremarkable in appearance. The orbits are within normal limits. The paranasal sinuses and mastoid air cells are well-aerated. Mild soft tissue swelling is suggested overlying the frontal calvarium. A metallic piercing is seen at the left nostril. CT CERVICAL SPINE FINDINGS There is no evidence of fracture or subluxation. Vertebral bodies demonstrate normal height and alignment. Intervertebral disc spaces are preserved. Prevertebral soft tissues are within normal limits. The visualized neural foramina are grossly unremarkable. The thyroid gland is unremarkable in appearance. The visualized lung apices are clear. No significant soft tissue abnormalities are seen. IMPRESSION: 1. No evidence of traumatic intracranial injury or fracture. 2. No evidence of fracture or subluxation along the cervical spine. 3. Mild soft tissue swelling suggested overlying the frontal calvarium. Electronically Signed   By: Roanna Raider M.D.   On: 02/17/2015 22:10    Other results: EKG: Marland Kitchen  Assessment & Plan by Problem: Active Problems:   Transaminitis  Ms. Chrismer is a 35 yo female with PTSD, depression, bipolar I, and h/o alcohol abuse, presenting with alcohol intoxication.   Transaminitis: Patient's AST/ALT markedly elevated above level expected from prolonged alcohol use, though alcohol level 385.  She denies SI or other ingestion.  Acetaminophen levels  negative x2.  Salicylate level negative.  Lamotrigine is metabolized by liver, but routine monitoring is not performed.  No abdominal pain suggesting gallbladder disease.   Checking acute hepatitis panel and lamotrogine level. - Hepatitis panel - Lamotrigine level  Alcohol Abuse: Patient drinking a fifth of liquor daily for a week.  Last drink yesterday evening.  She has h/o withdrawal, but denies seizures.  She has not been taking Acamprosate.  - CIWA - Consider starting Librium taper - Acamprosate 666 mg TID - Thiamine/Folate  PTSD, Bipolar, Depression: Denies SI at this time.  States she is still following her outpatient rehab program, but she did not go this week.  Will continue her home medications as reported from recent [facility] discharge. - Lamotrigine 25 mg - Prazosin 3 mg qHS - Lurasidone 80 mg qHS  FEN/GI: - Regular  DVT Ppx: Lovenox  Dispo: Disposition is deferred at this time, awaiting improvement of current medical problems. Anticipated discharge in approximately 2-3 day(s).   The patient does have a current PCP (Tammy Eartha Inch, MD) and does need an [facility] hospital follow-up appointment after discharge.  The patient does not have transportation limitations that hinder transportation to clinic appointments.  Signed: Jana Half, MD, PhD 02/18/2015, 5:27 AM

## 2015-02-18 NOTE — ED Notes (Signed)
[facility] contacted 

## 2015-02-18 NOTE — ED Notes (Signed)
Report given to nurse on [place] at [facility]

## 2015-02-18 NOTE — ED Notes (Signed)
Bed: WA12 Expected date:  Expected time:  Means of arrival:  Comments: 

## 2015-02-18 NOTE — Progress Notes (Signed)
Subjective: She is complaining of leg pain where she has scrapes on her knees. Also notes a headache. Denies any abdominal pain, nausea, and vomiting. She reports she started drinking again almost as soon as she left the hospital. She states her mother is working on finding her rehab once she leaves the hospital.   Objective: Vital signs in last 24 hours: Filed Vitals:   02/17/15 2021 02/17/15 2259 02/18/15 0200 02/18/15 0410  BP: 113/77 113/77 104/76 100/63  Pulse: 97 97 91 95  Temp: 97.6 F (36.4 C)   98 F (36.7 C)  TempSrc: Oral   Oral  Resp: 18   17  Height:     (1.626 m)  Weight:    134 lb 7.7 oz (61 kg)  SpO2: 92%  92% 100%   Weight change:   Intake/Output Summary (Last 24 hours) at 02/18/15 0941 Last data filed at 02/18/15 0900  Gross per 24 hour  Intake    840 ml  Output     50 ml  Net    790 ml   Physical Exam General: young woman sitting up in bed, appears disheveled  HEENT: [place]/AT, EOMI, sclera anicteric, mucus membranes moist  CV: RRR, no m/g/r Pulm: CTA bilaterally, breaths non-labored  Abd: BS+, soft, non-tender Ext: warm, no edema Neuro: alert and oriented x 3, follows all commands Skin: 1 cm superficial abrasion on left frontal forehead, no bleeding or drainage. Several superficial abrasions on bilateral knees, no bleeding or drainage.   Lab Results: Basic Metabolic Panel:  Recent Labs Lab 02/17/15 2137 02/18/15 0626  NA 145 140  K 3.8 3.1*  CL 103 102  CO2 27 26  GLUCOSE 83 105*  BUN 14 11  CREATININE 0.59 0.62  CALCIUM 8.7* 8.1*  MG  --  1.7   Liver Function Tests:  Recent Labs Lab 02/17/15 2137 02/18/15 0626  AST 422* 282*  ALT 667* 494*  ALKPHOS 45 36*  BILITOT 0.6 0.8  PROT 8.3* 6.9  ALBUMIN 4.4 3.4*   CBC:  Recent Labs Lab 02/17/15 2137  WBC 6.1  NEUTROABS 1.7  HGB 15.9*  HCT 46.0  MCV 89.1  PLT 400   Urine Drug Screen: Drugs of Abuse     Component Value Date/Time   LABOPIA NONE DETECTED 02/17/2015 2054     COCAINSCRNUR NONE DETECTED 02/17/2015 2054   LABBENZ POSITIVE* 02/17/2015 2054   AMPHETMU NONE DETECTED 02/17/2015 2054   THCU NONE DETECTED 02/17/2015 2054   LABBARB NONE DETECTED 02/17/2015 2054    Alcohol Level:  Recent Labs Lab 02/17/15 2137  ETH 385*    Studies/Results: Ct Head Wo Contrast  02/17/2015  CLINICAL DATA:  Question of fall; intoxication. Concern for head or cervical spine injury. Initial encounter. EXAM: CT HEAD WITHOUT CONTRAST CT CERVICAL SPINE WITHOUT CONTRAST TECHNIQUE: Multidetector CT imaging of the head and cervical spine was performed following the standard protocol without intravenous contrast. Multiplanar CT image reconstructions of the cervical spine were also generated. COMPARISON:  CT of the head and cervical spine performed 12/13/2014 FINDINGS: CT HEAD FINDINGS There is no evidence of acute infarction, mass lesion, or intra- or extra-axial hemorrhage on CT. The posterior fossa, including the cerebellum, brainstem and fourth ventricle, is within normal limits. The third and lateral ventricles, and basal ganglia are unremarkable in appearance. The cerebral hemispheres are symmetric in appearance, with normal gray-white differentiation. No mass effect or midline shift is seen. There is no evidence of fracture; visualized osseous structures are  unremarkable in appearance. The orbits are within normal limits. The paranasal sinuses and mastoid air cells are well-aerated. Mild soft tissue swelling is suggested overlying the frontal calvarium. A metallic piercing is seen at the left nostril. CT CERVICAL SPINE FINDINGS There is no evidence of fracture or subluxation. Vertebral bodies demonstrate normal height and alignment. Intervertebral disc spaces are preserved. Prevertebral soft tissues are within normal limits. The visualized neural foramina are grossly unremarkable. The thyroid gland is unremarkable in appearance. The visualized lung apices are clear. No significant  soft tissue abnormalities are seen. IMPRESSION: 1. No evidence of traumatic intracranial injury or fracture. 2. No evidence of fracture or subluxation along the cervical spine. 3. Mild soft tissue swelling suggested overlying the frontal calvarium. Electronically Signed   By: Roanna Raider M.D.   On: 02/17/2015 22:10   Ct Cervical Spine Wo Contrast  02/17/2015  CLINICAL DATA:  Question of fall; intoxication. Concern for head or cervical spine injury. Initial encounter. EXAM: CT HEAD WITHOUT CONTRAST CT CERVICAL SPINE WITHOUT CONTRAST TECHNIQUE: Multidetector CT imaging of the head and cervical spine was performed following the standard protocol without intravenous contrast. Multiplanar CT image reconstructions of the cervical spine were also generated. COMPARISON:  CT of the head and cervical spine performed 12/13/2014 FINDINGS: CT HEAD FINDINGS There is no evidence of acute infarction, mass lesion, or intra- or extra-axial hemorrhage on CT. The posterior fossa, including the cerebellum, brainstem and fourth ventricle, is within normal limits. The third and lateral ventricles, and basal ganglia are unremarkable in appearance. The cerebral hemispheres are symmetric in appearance, with normal gray-white differentiation. No mass effect or midline shift is seen. There is no evidence of fracture; visualized osseous structures are unremarkable in appearance. The orbits are within normal limits. The paranasal sinuses and mastoid air cells are well-aerated. Mild soft tissue swelling is suggested overlying the frontal calvarium. A metallic piercing is seen at the left nostril. CT CERVICAL SPINE FINDINGS There is no evidence of fracture or subluxation. Vertebral bodies demonstrate normal height and alignment. Intervertebral disc spaces are preserved. Prevertebral soft tissues are within normal limits. The visualized neural foramina are grossly unremarkable. The thyroid gland is unremarkable in appearance. The visualized  lung apices are clear. No significant soft tissue abnormalities are seen. IMPRESSION: 1. No evidence of traumatic intracranial injury or fracture. 2. No evidence of fracture or subluxation along the cervical spine. 3. Mild soft tissue swelling suggested overlying the frontal calvarium. Electronically Signed   By: Roanna Raider M.D.   On: 02/17/2015 22:10   Medications: I have reviewed the patient's current medications. Scheduled Meds: . acamprosate  666 mg Oral TID WC  . enoxaparin (LOVENOX) injection  40 mg Subcutaneous Daily  . folic acid  1 mg Oral Daily  . lurasidone  80 mg Oral QHS  . multivitamin with minerals  1 tablet Oral Daily  . potassium chloride  40 mEq Oral Once  . prazosin  3 mg Oral QHS  . thiamine  100 mg Intravenous Daily   Continuous Infusions: . sodium chloride 125 mL/hr at 02/18/15 0724   PRN Meds:.LORazepam **OR** LORazepam, ondansetron **OR** ondansetron (ZOFRAN) IV Assessment/Plan:  Transaminitis: LFTs elevated on admission with AST 422/ALT 667, now AST 282/ALT 494. Most likely secondary to alcohol abuse. Acetaminophen and salicylate levels normal. Checking lamotrigine level and acute hepatitis panel to evaluate for other possible causes. She had an abdominal US on 1/4 that showed no liver abnormalities and no cholelithiasis.  - Hold Lamotrigine for now until  level back - f/u hepatitis panel  - cmet in AM  Alcohol Abuse: Alcohol level 385 on admission. She reports drinking a fifth of liquor daily. Last drink on evening of 1/21.  - Continue CIWA - Start Librium taper - Continue Acamprosate 666 mg TID - Continue multivitamin, folic acid, thiamine supplementation   PTSD, Bipolar Disorder, Depression: Recent admission at [facility]. Currently stable, no SI/HI.  - Hold Lamotrigine until level back - Continue Prazosin 3 mg QHS - Continue Lurasidone 80 mg QHS  Diet: Regular  VTE PPx: Lovenox SQ Dispo: Discharge in 1-2 days.   The patient does have a current PCP  (Tammy Eartha Inch, MD) and does need an [facility] hospital follow-up appointment after discharge.  The patient does not have transportation limitations that hinder transportation to clinic appointments.  .Services Needed at time of discharge: Y = Yes, Blank = No PT:   OT:   RN:   Equipment:   Other:       Su Hoff, MD 02/18/2015, 9:41 AM

## 2015-02-18 NOTE — Progress Notes (Addendum)
Pt. Arrived to the unit via [facility] in stable condition. Pt. Placed on telemetry. CCMD notified. Pt. Oriented to room. VSS. IMTS paged. RN will continue to monitor pt. For changes in condition. Temeka Pore, Cheryll Dessert

## 2015-02-18 NOTE — Progress Notes (Signed)
Pt c/o not being able to void. Bladder scan shows more than 460cc's urine in bladder. Md paged

## 2015-02-18 NOTE — Progress Notes (Signed)
Pt able to void spontaneously

## 2015-02-18 NOTE — ED Notes (Signed)
[place] called for report. This nurse was told that they would call back soon.

## 2015-02-19 ENCOUNTER — Encounter (HOSPITAL_COMMUNITY): Payer: Self-pay | Admitting: General Practice

## 2015-02-19 DIAGNOSIS — F4312 Post-traumatic stress disorder, chronic: Secondary | ICD-10-CM | POA: Diagnosis not present

## 2015-02-19 DIAGNOSIS — F3170 Bipolar disorder, currently in remission, most recent episode unspecified: Secondary | ICD-10-CM | POA: Insufficient documentation

## 2015-02-19 DIAGNOSIS — F10239 Alcohol dependence with withdrawal, unspecified: Secondary | ICD-10-CM

## 2015-02-19 DIAGNOSIS — F319 Bipolar disorder, unspecified: Secondary | ICD-10-CM

## 2015-02-19 DIAGNOSIS — F10230 Alcohol dependence with withdrawal, uncomplicated: Secondary | ICD-10-CM | POA: Insufficient documentation

## 2015-02-19 DIAGNOSIS — F411 Generalized anxiety disorder: Secondary | ICD-10-CM | POA: Insufficient documentation

## 2015-02-19 DIAGNOSIS — R740 Nonspecific elevation of levels of transaminase and lactic acid dehydrogenase [LDH]: Secondary | ICD-10-CM | POA: Diagnosis not present

## 2015-02-19 DIAGNOSIS — X58XXXS Exposure to other specified factors, sequela: Secondary | ICD-10-CM

## 2015-02-19 LAB — COMPREHENSIVE METABOLIC PANEL
ALBUMIN: 3.1 g/dL — AB (ref 3.5–5.0)
ALT: 309 U/L — AB (ref 14–54)
AST: 121 U/L — AB (ref 15–41)
Alkaline Phosphatase: 34 U/L — ABNORMAL LOW (ref 38–126)
Anion gap: 9 (ref 5–15)
BILIRUBIN TOTAL: 1.3 mg/dL — AB (ref 0.3–1.2)
BUN: 8 mg/dL (ref 6–20)
CHLORIDE: 104 mmol/L (ref 101–111)
CO2: 24 mmol/L (ref 22–32)
CREATININE: 0.55 mg/dL (ref 0.44–1.00)
Calcium: 8.4 mg/dL — ABNORMAL LOW (ref 8.9–10.3)
GFR calc Af Amer: >60 mL/min (ref >60)
GLUCOSE: 91 mg/dL (ref 65–99)
POTASSIUM: 3.1 mmol/L — AB (ref 3.5–5.1)
Sodium: 137 mmol/L (ref 135–145)
TOTAL PROTEIN: 6 g/dL — AB (ref 6.5–8.1)

## 2015-02-19 LAB — URINE CULTURE

## 2015-02-19 LAB — PROTIME-INR
INR: 1 (ref 0.00–1.49)
Prothrombin Time: 13.4 seconds (ref 11.6–15.2)

## 2015-02-19 LAB — HEPATITIS PANEL, ACUTE
HCV Ab: <0.1 s/co ratio (ref 0.0–0.9)
Hep A IgM: NEGATIVE
Hep B C IgM: NEGATIVE
Hepatitis B Surface Ag: NEGATIVE

## 2015-02-19 LAB — MAGNESIUM: MAGNESIUM: 1.8 mg/dL (ref 1.7–2.4)

## 2015-02-19 MED ORDER — CHLORDIAZEPOXIDE HCL 25 MG PO CAPS: ORAL_CAPSULE | ORAL | Status: AC

## 2015-02-19 MED ORDER — FOLIC ACID 1 MG PO TABS: 1.0000 mg | ORAL_TABLET | Freq: Every day | ORAL | Status: AC

## 2015-02-19 MED ORDER — ADULT MULTIVITAMIN W/MINERALS CH: 1.0000 | ORAL_TABLET | Freq: Every day | ORAL | Status: AC

## 2015-02-19 MED ORDER — VITAMIN B-1 100 MG PO TABS
100.0000 mg | ORAL_TABLET | Freq: Every day | ORAL | Status: DC
  Administered 2015-02-19: 100 mg via ORAL
  Filled 2015-02-19: qty 1

## 2015-02-19 MED ORDER — POTASSIUM CHLORIDE CRYS ER 20 MEQ PO TBCR
40.0000 meq | EXTENDED_RELEASE_TABLET | Freq: Once | ORAL | Status: AC
  Administered 2015-02-19: 40 meq via ORAL
  Filled 2015-02-19: qty 2

## 2015-02-19 MED ORDER — THIAMINE HCL 100 MG PO TABS: 100.0000 mg | ORAL_TABLET | Freq: Every day | ORAL | Status: AC

## 2015-02-19 NOTE — Discharge Instructions (Signed)
It was a pleasure taking care of you, [name] Stewart.  - Please complete the Librium taper. You will take 50 mg three times daily for 3 days, 50 mg twice daily for 3 days, and 50 mg daily for 3 days and then stop. Your next dose should be at 4 PM today (02/19/15). Your last dose should be in the evening of 02/28/15.  - Make an appointment with your primary care doctor in the next 1-2 weeks - Please follow up with your psychiatrist  Take care, Dr. Beckie Salts  Alcohol Withdrawal Alcohol withdrawal is a group of symptoms that can develop when a person who drinks heavily and regularly stops drinking or drinks less. CAUSES Heavy and regular drinking can cause chemicals that send signals from the brain to the body (neurotransmitters) to deactivate. Alcohol withdrawal develops when deactivated neurotransmitters reactivate because a person stops drinking or drinks less. RISK FACTORS The more a person drinks and the longer he or she drinks, the greater the risk of alcohol withdrawal. Severe withdrawal is more likely to develop in someone who:  Had severe alcohol withdrawal in the past.  Had a seizure during a previous episode of alcohol withdrawal.  Is elderly.  Is pregnant.  Has been abusing drugs.  Has other medical problems, including:  Infection.  Heart, lung, or liver disease.  Seizures.  Mental health problems. SYMPTOMS Symptoms of this condition can be mild to moderate, or they can be severe. Mild to moderate symptoms may include:  Fatigue.  Nightmares.  Trouble sleeping.  Depression.  Anxiety.  Inability to think clearly.  Mood swings.  Irritability.  Loss of appetite.  Nausea or vomiting.  Clammy skin.  Extreme sweating.  Rapid heartbeat.  Shakiness.  Uncontrollable shaking (tremor). Severe symptoms may include:  Fever.  Seizures.  Severeconfusion.  Feeling or seeing things that are not there (hallucinations). Symptoms usually begin within eight hours  after a person stops drinking or drinks less. They can last for weeks. DIAGNOSIS Alcohol withdrawal is diagnosed with a medical history and physical exam. Sometimes, urine and blood tests are also done. TREATMENT Treatment may involve:  Monitoring blood pressure, pulse, and breathing.  Getting fluids through an IV tube.  Medicine to reduce anxiety.  Medicine to prevent or control seizures.  Multivitamins and B vitamins.  Having a health care provider check on you daily. If symptoms are moderate to severe or if there is a risk of severe withdrawal, treatment may be done at a hospital or treatment center. HOME CARE INSTRUCTIONS  Take medicines and vitamin supplements only as directed by your health care provider.  Do not drink alcohol.  Have someone stay with you or be available if you need help.  Drink enough fluid to keep your urine clear or pale yellow.  Consider joining a 12-step program or another alcohol support group. SEEK MEDICAL CARE IF:  Your symptoms get worse or do not go away.  You cannot keep food or water in your stomach.  You are struggling with not drinking alcohol.  You cannot stop drinking alcohol. SEEK IMMEDIATE MEDICAL CARE IF:   You have an irregular heartbeat.  You have chest pain.  You have trouble breathing.  You have symptoms of severe withdrawal, such as:  A fever.  Seizures.  Severe confusion.  Hallucinations.   This information is not intended to replace advice given to you by your health care provider. Make sure you discuss any questions you have with your health care provider.   Document  Released: 10/23/2004 Document Revised: 02/03/2014 Document Reviewed: 11/01/2013 Elsevier Interactive Patient Education [employer]

## 2015-02-19 NOTE — Discharge Summary (Signed)
Name: [name] Stewart MRN: [id number] DOB: [birth year] 35 y.o. PCP: Verlon Au, MD  Date of Admission: 02/17/2015  8:02 PM Date of Discharge: 02/19/2015 Attending Physician: Levert Feinstein, MD  Discharge Diagnosis: Principal Problem Alcohol Withdrawal Active Problems Transaminitis Bipolar Disorder and PTSD  Discharge Medications:   Medication List    STOP taking these medications        methylPREDNISolone 4 MG Tbpk tablet  Commonly known as:  MEDROL DOSEPAK      TAKE these medications        acamprosate 333 MG tablet  Commonly known as:  CAMPRAL  Take 2 tablets (666 mg total) by mouth 3 (three) times daily with meals.     chlordiazePOXIDE 25 MG capsule  Commonly known as:  LIBRIUM  Take 50 mg (2 caps) three times daily for 3 days, 50 mg (2 caps) twice daily for 3 days, and 50 mg daily for 3 days, then stop.     folic acid 1 MG tablet  Commonly known as:  FOLVITE  Take 1 tablet (1 mg total) by mouth daily.     hydrOXYzine 25 MG tablet  Commonly known as:  ATARAX/VISTARIL  Take 1 tablet (25 mg total) by mouth every 4 (four) hours as needed for anxiety.     lamoTRIgine 25 MG tablet  Commonly known as:  LAMICTAL  Take 1 tablet (25 mg total) by mouth daily.     lurasidone 80 MG Tabs tablet  Commonly known as:  LATUDA  Take 1 tablet (80 mg total) by mouth at bedtime.     multivitamin with minerals Tabs tablet  Take 1 tablet by mouth daily.     prazosin 1 MG capsule  Commonly known as:  MINIPRESS  Take 3 capsules (3 mg total) by mouth at bedtime.     thiamine 100 MG tablet  Take 1 tablet (100 mg total) by mouth daily.        Disposition and follow-up:   [name] Stewart was discharged from [facility] in Good condition.  At the hospital follow up visit please address:  1.  Alcohol abuse- Sent home with Librium taper. Determine if she has been going to her outpatient rehab meetings and following with her psychiatrist.  Transaminitis-  Check repeat CMET. Likely related to her alcohol abuse.  2.  Labs / imaging needed at time of follow-up: CMET  3.  Pending labs/ test needing follow-up: None  Follow-up Appointments: Follow-up Information    Follow up with Verlon Au, MD.   Specialty:  Family Medicine   Why:  Please make an appointment to see her in the next 1-2 weeks   Contact information:   [address] [facility] [place] [address] [phone]       Discharge Instructions: Discharge Instructions    Diet - low sodium heart healthy    Complete by:  As directed      Increase activity slowly    Complete by:  As directed            Consultations:    Procedures Performed:  Ct Head Wo Contrast  02/17/2015  CLINICAL DATA:  Question of fall; intoxication. Concern for head or cervical spine injury. Initial encounter. EXAM: CT HEAD WITHOUT CONTRAST CT CERVICAL SPINE WITHOUT CONTRAST TECHNIQUE: Multidetector CT imaging of the head and cervical spine was performed following the standard protocol without intravenous contrast. Multiplanar CT image reconstructions of the cervical spine were also generated.  COMPARISON:  CT of the head and cervical spine performed 12/13/2014 FINDINGS: CT HEAD FINDINGS There is no evidence of acute infarction, mass lesion, or intra- or extra-axial hemorrhage on CT. The posterior fossa, including the cerebellum, brainstem and fourth ventricle, is within normal limits. The third and lateral ventricles, and basal ganglia are unremarkable in appearance. The cerebral hemispheres are symmetric in appearance, with normal gray-white differentiation. No mass effect or midline shift is seen. There is no evidence of fracture; visualized osseous structures are unremarkable in appearance. The orbits are within normal limits. The paranasal sinuses and mastoid air cells are well-aerated. Mild soft tissue swelling is suggested overlying the frontal calvarium. A metallic  piercing is seen at the left nostril. CT CERVICAL SPINE FINDINGS There is no evidence of fracture or subluxation. Vertebral bodies demonstrate normal height and alignment. Intervertebral disc spaces are preserved. Prevertebral soft tissues are within normal limits. The visualized neural foramina are grossly unremarkable. The thyroid gland is unremarkable in appearance. The visualized lung apices are clear. No significant soft tissue abnormalities are seen. IMPRESSION: 1. No evidence of traumatic intracranial injury or fracture. 2. No evidence of fracture or subluxation along the cervical spine. 3. Mild soft tissue swelling suggested overlying the frontal calvarium. Electronically Signed   By: Roanna Raider M.D.   On: 02/17/2015 22:10   Ct Cervical Spine Wo Contrast  02/17/2015  CLINICAL DATA:  Question of fall; intoxication. Concern for head or cervical spine injury. Initial encounter. EXAM: CT HEAD WITHOUT CONTRAST CT CERVICAL SPINE WITHOUT CONTRAST TECHNIQUE: Multidetector CT imaging of the head and cervical spine was performed following the standard protocol without intravenous contrast. Multiplanar CT image reconstructions of the cervical spine were also generated. COMPARISON:  CT of the head and cervical spine performed 12/13/2014 FINDINGS: CT HEAD FINDINGS There is no evidence of acute infarction, mass lesion, or intra- or extra-axial hemorrhage on CT. The posterior fossa, including the cerebellum, brainstem and fourth ventricle, is within normal limits. The third and lateral ventricles, and basal ganglia are unremarkable in appearance. The cerebral hemispheres are symmetric in appearance, with normal gray-white differentiation. No mass effect or midline shift is seen. There is no evidence of fracture; visualized osseous structures are unremarkable in appearance. The orbits are within normal limits. The paranasal sinuses and mastoid air cells are well-aerated. Mild soft tissue swelling is suggested  overlying the frontal calvarium. A metallic piercing is seen at the left nostril. CT CERVICAL SPINE FINDINGS There is no evidence of fracture or subluxation. Vertebral bodies demonstrate normal height and alignment. Intervertebral disc spaces are preserved. Prevertebral soft tissues are within normal limits. The visualized neural foramina are grossly unremarkable. The thyroid gland is unremarkable in appearance. The visualized lung apices are clear. No significant soft tissue abnormalities are seen. IMPRESSION: 1. No evidence of traumatic intracranial injury or fracture. 2. No evidence of fracture or subluxation along the cervical spine. 3. Mild soft tissue swelling suggested overlying the frontal calvarium. Electronically Signed   By: Roanna Raider M.D.   On: 02/17/2015 22:10   US Abdomen Complete  01/31/2015  CLINICAL DATA:  Elevated liver enzymes EXAM: ABDOMEN ULTRASOUND COMPLETE COMPARISON:  None. FINDINGS: Gallbladder: No gallstones or wall thickening visualized. There is no pericholecystic fluid. No sonographic Murphy sign noted by sonographer. Common bile duct: Diameter: 4 mm. There is no intrahepatic, common hepatic, or common bile duct dilatation. Liver: No focal lesion identified. Within normal limits in parenchymal echogenicity. IVC: No abnormality visualized. Pancreas: No mass or inflammatory focus. Spleen:  Size and appearance within normal limits. Right Kidney: Length: 12.5 cm. Echogenicity within normal limits. No mass or hydronephrosis visualized. Left Kidney: Length: 12.8 cm. Echogenicity within normal limits. No mass or hydronephrosis visualized. Abdominal aorta: No aneurysm visualized. Other findings: No demonstrable ascites. IMPRESSION: Study within normal limits. Electronically Signed   By: Bretta Bang III M.D.   On: 01/31/2015 16:15     Admission HPI: [name] Stewart is a 35 yo female with PTSD, depression, bipolar I, and h/o alcohol abuse, presenting with alcohol intoxication. She  presented to [facility] ED because she was "drinking too much," and "was unable to stop safely." She reports drinking at least a fifth of liquor daily for the last 5-6 days. Her last drink was approximately 8 pm yesterday. She currently denies hallucinations, but has a slight tremor. She has had tactile hallucinations in the past, but denies withdrawal seizures. She was recently discharged from [facility] for bipolar with severe depression and crisis management. She was discharged on Acamprosate, which she has not been taking. She was also discharged on Lamotrigine, Lurasidone, and Prazosin, for which she reports adherence. She denies the use of other illicit substances. She denies SI/HI. Her mood is "I don't know."  She reports having fallen while intoxicated yesterday, sustaining a bruise to RUE and small abrasion to her forehead. CT head and C-spine were negative, except for soft tissue swelling over frontal calvarium. She was also noted to have elevated AST/ALT to 422/667.   Hospital Course by problem list:   Alcohol Withdrawal: She presented with alcohol intoxication with an EtOH level of 385. She was previously admitted earlier this month for alcohol withdrawal and had planned to go to an outpatient rehab center to detox but relapsed soon after hospitalization. She was started on a Librium taper, placed on CIWA protocol with Ativan as needed, and continued on her home acamprosate 666 mg TID. She did not experience significant withdrawal symptoms and was stable at time of discharge. She plans on attending outpatient rehab at the [facility] starting tonight as well as following up with her psychiatrist tomorrow. She will be discharged on a Librium taper with 50 mg TID x 3 days, 50 mg BID x 3 days, ans then 50 mg daily x 3 days. She was counseled on the importance of alcohol cessation.   Transaminitis: Likely secondary to her alcohol abuse. Her AST/ALT levels were markedly elevated on  admission but trended downward by time of discharge. Her acetaminophen levels were normal upon admission. She had a normal hepatitis C antibody in early January this year. Hepatitis B serologies showed no active infection as well as no immunity. She will need vaccinations for both Hep A and Hep B as an outpatient. Her abdominal US in early January was also normal. PT/INR levels were normal. She will need a repeat cmet as an outpatient to ensure her transaminitis has resolved.   Bipolar Disorder and PTSD: Remained stable during her hospitalization. She was continued on her home Lamotrigine, Prazosin, and Lurasidone.   Discharge Vitals:   BP 107/73 mmHg  Pulse 85  Temp(Src) 97.5 F (36.4 C) (Oral)  Resp 20  Ht  (1.626 m)  Wt 137 lb (62.143 kg)  BMI 23.50 kg/m2  SpO2 95% Physical exam: General: resting in bed comfortably, appropriately conversational Cardiac: regular rate and rhythm, no rubs, murmurs or gallops Pulm: breathing well, clear to auscultation bilaterally Neuro: no tremor  Discharge Labs:  Results for orders placed or performed during the hospital  encounter of 02/17/15 (from the past 24 hour(s))  Comprehensive metabolic panel     Status: Abnormal   Collection Time: 02/19/15  5:57 AM  Result Value Ref Range   Sodium 137 135 - 145 mmol/L   Potassium 3.1 (L) 3.5 - 5.1 mmol/L   Chloride 104 101 - 111 mmol/L   CO2 24 22 - 32 mmol/L   Glucose, Bld 91 65 - 99 mg/dL   BUN 8 6 - 20 mg/dL   Creatinine, Ser 5.40 0.44 - 1.00 mg/dL   Calcium 8.4 (L) 8.9 - 10.3 mg/dL   Total Protein 6.0 (L) 6.5 - 8.1 g/dL   Albumin 3.1 (L) 3.5 - 5.0 g/dL   AST 981 (H) 15 - 41 U/L   ALT 309 (H) 14 - 54 U/L   Alkaline Phosphatase 34 (L) 38 - 126 U/L   Total Bilirubin 1.3 (H) 0.3 - 1.2 mg/dL   GFR calc non Af Amer >60 >60 mL/min   GFR calc Af Amer >60 >60 mL/min   Anion gap 9 5 - 15  Magnesium     Status: None   Collection Time: 02/19/15  5:57 AM  Result Value Ref Range   Magnesium 1.8 1.7 -  2.4 mg/dL  Protime-INR     Status: None   Collection Time: 02/19/15 12:20 PM  Result Value Ref Range   Prothrombin Time 13.4 11.6 - 15.2 seconds   INR 1.00 0.00 - 1.49    Signed: Su Hoff, MD 02/19/2015, 2:57 PM    Services Ordered on Discharge: None Equipment Ordered on Discharge: None

## 2015-02-19 NOTE — Discharge Summary (Signed)
Pt got discharged to home, discharge instructions provided and patient showed understanding to it, IV taken out,Telemonitor DC,pt left unit  ambulatory with all of the belongings . 

## 2015-02-19 NOTE — Progress Notes (Signed)
Patient ID: [name] Stewart, female   DOB: 1980-10-29, 35 y.o.   MRN: [id number]   Subjective: Ms. [name] Stewart is feeling well. No tremors or complaints. Would like to go home.  Objective: Vital signs in last 24 hours: Filed Vitals:   02/18/15 1200 02/18/15 1959 02/19/15 0043 02/19/15 0516  BP: 135/81 124/80 120/81 107/73  Pulse: 94 75 62 85  Temp: 98.6 F (37 C) 98.4 F (36.9 C) 97.9 F (36.6 C) 97.5 F (36.4 C)  TempSrc: Oral Oral Oral Oral  Resp: Height:      Weight:    62.143 kg (137 lb)  SpO2: 98% 99% 98% 95%   Physical exam: General: resting in bed comfortably, appropriately conversational Cardiac: regular rate and rhythm, no rubs, murmurs or gallops Pulm: breathing well, clear to auscultation bilaterally Neuro: no tremor  Lab Results: Basic Metabolic Panel:  Recent Labs Lab 02/18/15 0626 02/19/15 0557  NA 140 137  K 3.1* 3.1*  CL 102 104  CO2 26 24  GLUCOSE 105* 91  BUN 11 8  CREATININE 0.62 0.55  CALCIUM 8.1* 8.4*  MG 1.7 1.8   Liver Function Tests:  Recent Labs Lab 02/18/15 0626 02/19/15 0557  AST 282* 121*  ALT 494* 309*  ALKPHOS 36* 34*  BILITOT 0.8 1.3*  PROT 6.9 6.0*  ALBUMIN 3.4* 3.1*   CBC:  Recent Labs Lab 02/17/15 2137  WBC 6.1  NEUTROABS 1.7  HGB 15.9*  HCT 46.0  MCV 89.1  PLT 400    Medications: I have reviewed the patient's current medications. Scheduled Meds: . acamprosate  666 mg Oral TID WC  . chlordiazePOXIDE  50 mg Oral TID  . enoxaparin (LOVENOX) injection  40 mg Subcutaneous Daily  . folic acid  1 mg Oral Daily  . lurasidone  80 mg Oral QHS  . multivitamin with minerals  1 tablet Oral Daily  . prazosin  3 mg Oral QHS  . thiamine  100 mg Oral Daily   Continuous Infusions:  PRN Meds:.LORazepam **OR** LORazepam, ondansetron **OR** ondansetron (ZOFRAN) IV   Assessment/Plan:  Alcohol withdrawal: Her last drink was on the evening of 1/20. She does not appear to be in any withdrawals right now. We will continue  the chlordiazepoxide 50 mg 3 times daily and send her home on a taper. We'll get social work to see her to help her come up with rehabilitation plan; she may be able to go home today if we can nail down a game plan. -Consulted social work -Continue chlordiazepoxide 50 mg 3 times daily for now -If discharged, will taper with: chlordiazepoxide 50 mg 3 times daily for 3 days, 50 mg 2 times daily for 3 days, 50 mg daily for 3 days, and then drop dose to 25 mg daily for 3 days, then stop -Continue acamprosate 666 mg 3 times daily -Continue B1 and B9 vitamins  Transaminitis: Oddly, her transaminitis is predominantly an ALT elevation but I think this is most likely related to alcohol regardless. Her acetaminophen levels were normal upon admission. She had a normal hepatitis C antibody back in early January. Hepatitis B serologies were ordered. She had a normal abdominal ultrasound back in early January as well. Fortunately her transaminitis is improving since she has abstained from alcohol in the last 3 days. I checked an INR. Her platelet counts are normal and she does not have hyperbilirubinemia, so I doubt this is alcoholic hepatitis.  -Follow hepatitis B serologies -Follow-up INR  Bipolar disorder  and post-traumatic stress disorder: Her affect appears normal. Social work will help with outpatient disposition. -Continue lamotrigine 25 mg daily -Continue prazosin 3 mg nightly -Continue lurasidone 80 mg nightly  Dispo: Disposition is deferred at this time, awaiting improvement of current medical problems.  Anticipated discharge in approximately 0-1 day(s).   The patient does have a current PCP (Tammy [name] Inch, MD) and does need an [facility] hospital follow-up appointment after discharge.  The patient does not know have transportation limitations that hinder transportation to clinic appointments.  .Services Needed at time of discharge: Y = Yes, Blank = No PT:   OT:   RN:   Equipment:   Other:        [name] Cooley, MD 02/19/2015, 11:17 AM

## 2015-02-19 NOTE — Progress Notes (Signed)
Unable to assess for CIWA, pt is asleep at this time.

## 2015-02-20 LAB — LAMOTRIGINE LEVEL: LAMOTRIGINE LVL: NOT DETECTED ug/mL (ref 2.0–20.0)

## 2015-05-16 ENCOUNTER — Emergency Department (HOSPITAL_COMMUNITY)
Admission: EM | Admit: 2015-05-16 | Discharge: 2015-05-17 | Disposition: A | Discharge status: Home / Self Care | Payer: BLUE CROSS/BLUE SHIELD | Attending: Emergency Medicine | Admitting: Emergency Medicine

## 2015-05-16 ENCOUNTER — Emergency Department (HOSPITAL_COMMUNITY): Payer: BLUE CROSS/BLUE SHIELD

## 2015-05-16 ENCOUNTER — Encounter (HOSPITAL_COMMUNITY): Payer: Self-pay | Admitting: Emergency Medicine

## 2015-05-16 DIAGNOSIS — X58XXXA Exposure to other specified factors, initial encounter: Secondary | ICD-10-CM | POA: Insufficient documentation

## 2015-05-16 DIAGNOSIS — Z791 Long term (current) use of non-steroidal anti-inflammatories (NSAID): Secondary | ICD-10-CM | POA: Diagnosis not present

## 2015-05-16 DIAGNOSIS — Z79899 Other long term (current) drug therapy: Secondary | ICD-10-CM | POA: Insufficient documentation

## 2015-05-16 DIAGNOSIS — Y999 Unspecified external cause status: Secondary | ICD-10-CM | POA: Insufficient documentation

## 2015-05-16 DIAGNOSIS — S0181XA Laceration without foreign body of other part of head, initial encounter: Secondary | ICD-10-CM

## 2015-05-16 DIAGNOSIS — F4310 Post-traumatic stress disorder, unspecified: Secondary | ICD-10-CM | POA: Diagnosis not present

## 2015-05-16 DIAGNOSIS — Y939 Activity, unspecified: Secondary | ICD-10-CM | POA: Diagnosis not present

## 2015-05-16 DIAGNOSIS — F10120 Alcohol abuse with intoxication, uncomplicated: Secondary | ICD-10-CM | POA: Insufficient documentation

## 2015-05-16 DIAGNOSIS — F329 Major depressive disorder, single episode, unspecified: Secondary | ICD-10-CM | POA: Insufficient documentation

## 2015-05-16 DIAGNOSIS — F10920 Alcohol use, unspecified with intoxication, uncomplicated: Secondary | ICD-10-CM

## 2015-05-16 DIAGNOSIS — Y929 Unspecified place or not applicable: Secondary | ICD-10-CM | POA: Diagnosis not present

## 2015-05-16 LAB — COMPREHENSIVE METABOLIC PANEL
ALBUMIN: 3.9 g/dL (ref 3.5–5.0)
ALT: 33 U/L (ref 14–54)
ANION GAP: 17 — AB (ref 5–15)
AST: 40 U/L (ref 15–41)
Alkaline Phosphatase: 27 U/L — ABNORMAL LOW (ref 38–126)
BUN: 14 mg/dL (ref 6–20)
CALCIUM: 7.7 mg/dL — AB (ref 8.9–10.3)
CHLORIDE: 105 mmol/L (ref 101–111)
CO2: 19 mmol/L — AB (ref 22–32)
Creatinine, Ser: 0.54 mg/dL (ref 0.44–1.00)
GFR calc non Af Amer: >60 mL/min (ref >60)
GLUCOSE: 89 mg/dL (ref 65–99)
POTASSIUM: 4.3 mmol/L (ref 3.5–5.1)
SODIUM: 141 mmol/L (ref 135–145)
Total Bilirubin: 0.6 mg/dL (ref 0.3–1.2)
Total Protein: 7.2 g/dL (ref 6.5–8.1)

## 2015-05-16 LAB — CBC
HCT: 38.2 % (ref 36.0–46.0)
HEMOGLOBIN: 13.5 g/dL (ref 12.0–15.0)
MCH: 29.4 pg (ref 26.0–34.0)
MCHC: 35.3 g/dL (ref 30.0–36.0)
MCV: 83.2 fL (ref 78.0–100.0)
PLATELETS: 290 10*3/uL (ref 150–400)
RBC: 4.59 MIL/uL (ref 3.87–5.11)
RDW: 13.9 % (ref 11.5–15.5)
WBC: 10.8 10*3/uL — ABNORMAL HIGH (ref 4.0–10.5)

## 2015-05-16 LAB — ETHANOL: ALCOHOL ETHYL (B): 403 mg/dL — AB (ref <5)

## 2015-05-16 MED ORDER — LORAZEPAM 2 MG/ML IJ SOLN: 1.0000 mg | Freq: Once | INTRAMUSCULAR | Status: DC

## 2015-05-16 MED ORDER — SODIUM CHLORIDE 0.9 % IV BOLUS (SEPSIS)
1000.0000 mL | Freq: Once | INTRAVENOUS | Status: AC
  Administered 2015-05-16: 1000 mL via INTRAVENOUS

## 2015-05-16 MED ORDER — LORAZEPAM 2 MG/ML IJ SOLN
1.0000 mg | Freq: Once | INTRAMUSCULAR | Status: AC
  Administered 2015-05-16: 1 mg via INTRAVENOUS
  Filled 2015-05-16: qty 1

## 2015-05-16 MED ORDER — LIDOCAINE-EPINEPHRINE (PF) 2 %-1:200000 IJ SOLN
INTRAMUSCULAR | Status: AC
  Administered 2015-05-17: 20 mL
  Filled 2015-05-16: qty 20

## 2015-05-16 MED ORDER — LIDOCAINE-EPINEPHRINE 2 %-1:100000 IJ SOLN: 20.0000 mL | Freq: Once | INTRAMUSCULAR | Status: DC

## 2015-05-16 NOTE — ED Notes (Signed)
Pt BIB EMS from home; pt's friend found pt in bed with dried blood on head; pt has hx of alcoholism and pt's friend says pt was sober for about a month and began drinking again 4 days ago when one of her friends died; Empty bottle of wine and bottle of vodka with some alcohol left in it were found at scene; CBG 96; pt responding to pain and to voice but appears withdrawn and depressed; no injuries to lower body.

## 2015-05-16 NOTE — ED Provider Notes (Signed)
CSN: [id number]     Arrival date & time 05/16/15  1948 History   First MD Initiated Contact with Patient 05/16/15 2116     Chief Complaint  Patient presents with  . Head Laceration  . Alcohol Intoxication     (Consider location/radiation/quality/duration/timing/severity/associated sxs/prior Treatment) HPI  Expand All Collapse All   EMS from home; pt's friend found pt in bed with dried blood on head; pt has hx of alcoholism and pt's friend says pt was sober for about a month and began drinking again 4 days ago when one of her friends died; Empty bottle of wine and bottle of vodka with some alcohol left in it were found at scene; CBG 96; pt responding to pain and to voice but appears withdrawn and depressed; no injuries to lower body.        Past Medical History  Diagnosis Date  . Alcoholic (HCC)   . Anxiety   . Post traumatic stress disorder (PTSD)   . Alcohol related seizure (HCC)   . Bipolar disorder (HCC)   . Tension headache, chronic     "monthly" (01/31/2015)  . Depression    Past Surgical History  Procedure Laterality Date  . Osteochondroma excision Right ~ 1996    "knee"  . Excisional hemorrhoidectomy  20111  . Refractive surgery Bilateral ~ 2011   Family History  Problem Relation Age of Onset  . Anxiety disorder Mother   . Anxiety disorder Sister   . Anxiety disorder Maternal Grandmother   . Anxiety disorder Father   . Alcohol abuse Father   . Alcohol abuse Brother   . Alcohol abuse Paternal Uncle    Social History  Substance Use Topics  . Smoking status: Never Smoker   . Smokeless tobacco: Never Used  . Alcohol Use: Yes     Comment: Wine and Vodka. Last drink: Today   OB History    No data available     Review of Systems  Unable to perform ROS: Other      Allergies  Naltrexone and Baclofen  Home Medications   Prior to Admission medications   Medication Sig Start Date End Date Taking? Authorizing Provider  acamprosate (CAMPRAL) 333 MG tablet  Take 2 tablets (666 mg total) by mouth 3 (three) times daily with meals. Patient not taking: Reported on 05/28/2015 02/08/15   Adonis Brook, NP  busPIRone (BUSPAR) 10 MG tablet Take 10 mg by mouth 3 (three) times daily.    Historical Provider, MD  clonazePAM (KLONOPIN) 1 MG tablet Take 1 mg by mouth at bedtime.    Historical Provider, MD  folic acid (FOLVITE) 1 MG tablet Take 1 tablet (1 mg total) by mouth daily. Patient not taking: Reported on 05/28/2015 02/19/15   Iris Pert Rivet, MD  gabapentin (NEURONTIN) 100 MG capsule Take 100 mg by mouth 3 (three) times daily.    Historical Provider, MD  hydrOXYzine (ATARAX/VISTARIL) 25 MG tablet Take 1 tablet (25 mg total) by mouth every 4 (four) hours as needed for anxiety. 02/08/15   Adonis Brook, NP  ketorolac (TORADOL) 10 MG tablet Take 10 mg by mouth 2 (two) times daily as needed (pain.).    Historical Provider, MD  lamoTRIgine (LAMICTAL) 25 MG tablet Take 1 tablet (25 mg total) by mouth daily. Patient taking differently: Take 50 mg by mouth every morning.  02/08/15   Adonis Brook, NP  Levonorgestrel-Ethinyl Estradiol (CAMRESE) 0.15-0.03 &0.01 MG tablet Take 1 tablet by mouth daily.    Historical Provider, MD  lurasidone (LATUDA) 80 MG TABS tablet Take 1 tablet (80 mg total) by mouth at bedtime. 02/08/15   Adonis Brook, NP  Multiple Vitamin (MULTIVITAMIN WITH MINERALS) TABS tablet Take 1 tablet by mouth daily. 02/19/15   Su Hoff, MD  naphazoline-glycerin (CLEAR EYES) 0.012-0.2 % SOLN Place 1 drop into both eyes 4 (four) times daily as needed for irritation.    Historical Provider, MD  prazosin (MINIPRESS) 1 MG capsule Take 3 capsules (3 mg total) by mouth at bedtime. 02/08/15   Adonis Brook, NP  thiamine 100 MG tablet Take 1 tablet (100 mg total) by mouth daily. Patient not taking: Reported on 05/28/2015 02/19/15   Iris Pert Rivet, MD  tiZANidine (ZANAFLEX) 2 MG tablet Take 2 mg by mouth 2 (two) times daily as needed for muscle spasms.    Historical  Provider, MD  traZODone (DESYREL) 50 MG tablet Take 50 mg by mouth at bedtime as needed for sleep.    Historical Provider, MD  valACYclovir (VALTREX) 500 MG tablet Take 500 mg by mouth daily as needed (Outbreaks.).    Historical Provider, MD  Vilazodone HCl (VIIBRYD) 40 MG TABS Take 40 mg by mouth daily.    Historical Provider, MD   BP 119/77 mmHg  Pulse 100  Temp(Src) 99.1 F (37.3 C) (Oral)  Resp 18  SpO2 95% Physical Exam  Constitutional: She is oriented to person, place, and time. She appears well-developed and well-nourished. No distress.  HENT:  Head: Normocephalic. Head is with abrasion and with laceration.    Eyes: Pupils are equal, round, and reactive to light.  Neck: Normal range of motion.  Cardiovascular: Normal rate and intact distal pulses.   Pulmonary/Chest: No respiratory distress.  Abdominal: Normal appearance. She exhibits no distension.  Musculoskeletal: Normal range of motion.  Neurological: She is alert and oriented to person, place, and time. No cranial nerve deficit.  Skin: Skin is warm and dry. No rash noted.  Psychiatric: Her affect is blunt. Her speech is slurred. She is slowed. Cognition and memory are impaired. She expresses no homicidal and no suicidal ideation.  Nursing note and vitals reviewed.   ED Course  Procedures (including critical care time) Medications  sodium chloride 0.9 % bolus 1,000 mL (0 mLs Intravenous Stopped 05/17/15 0300)  LORazepam (ATIVAN) injection 1 mg (1 mg Intravenous Given 05/16/15 2239)  lidocaine-EPINEPHrine (XYLOCAINE W/EPI) 2 %-1:200000 (PF) injection (20 mLs  Given 05/17/15 0131)  sodium chloride 0.9 % bolus 1,000 mL (0 mLs Intravenous Stopped 05/17/15 0635)    Labs Review Labs Reviewed  ETHANOL - Abnormal; Notable for the following:    Alcohol, Ethyl (B) 403 (*)    All other components within normal limits  CBC - Abnormal; Notable for the following:    WBC 10.8 (*)    All other components within normal limits    COMPREHENSIVE METABOLIC PANEL - Abnormal; Notable for the following:    CO2 19 (*)    Calcium 7.7 (*)    Alkaline Phosphatase 27 (*)    Anion gap 17 (*)    All other components within normal limits  URINE RAPID DRUG SCREEN, HOSP PERFORMED    Imaging Review Dg Thoracic Spine 2 View  06/01/2015  CLINICAL DATA:  Mid back pain.  Unwitnessed fall. EXAM: THORACIC SPINE 2 VIEWS COMPARISON:  05/29/2015 FINDINGS: No vertebral compression deformity.  Anatomic alignment. IMPRESSION: No acute bony pathology. Electronically Signed   By: Jolaine Click M.D.   On: 06/01/2015 10:02   Dg Lumbar  Spine 2-3 Views  06/01/2015  CLINICAL DATA:  Back pain . EXAM: LUMBAR SPINE - 2-3 VIEW COMPARISON:  No recent prior . FINDINGS: Multilevel mild degenerative change. No acute bony abnormality identified. Normal Mild scoliosis concave right. Normal mineralization. IMPRESSION: Mild scoliosis concave right.  No acute abnormality . Electronically Signed   By: Maisie Fushomas  Register   On: 06/01/2015 10:04   I have personally reviewed and evaluated these images and lab results as part of my medical decision-making.   EKG Interpretation   Date/Time:  Wednesday May 16 2015 20:06:04 EDT Ventricular Rate:  123 PR Interval:  145 QRS Duration: 92 QT Interval:  302 QTC Calculation: 432 R Axis:   64 Text Interpretation:  Sinus tachycardia Otherwise normal ECG Confirmed by  Emelio Schneller  MD, Merlyn Bollen ([id number]) on 05/16/2015 9:17:49 PM      MDM   Final diagnoses:  Alcohol intoxication, uncomplicated (HCC)  Forehead laceration, initial encounter        Nelva Nayobert Mariapaula Krist, MD 06/01/15 [phone]

## 2015-05-17 LAB — RAPID URINE DRUG SCREEN, HOSP PERFORMED
AMPHETAMINES: NOT DETECTED
BARBITURATES: NOT DETECTED
BENZODIAZEPINES: NOT DETECTED
Cocaine: NOT DETECTED
Opiates: NOT DETECTED
TETRAHYDROCANNABINOL: NOT DETECTED

## 2015-05-17 MED ORDER — SODIUM CHLORIDE 0.9 % IV BOLUS (SEPSIS)
1000.0000 mL | Freq: Once | INTRAVENOUS | Status: AC
  Administered 2015-05-17: 1000 mL via INTRAVENOUS

## 2015-05-17 MED ORDER — SODIUM CHLORIDE 0.9 % IV BOLUS (SEPSIS): 500.0000 mL | Freq: Once | INTRAVENOUS | Status: DC

## 2015-05-17 NOTE — ED Notes (Signed)
Meal provided to pt

## 2015-05-17 NOTE — ED Notes (Addendum)
Pt used restroom but was unable to urinate into specimen cup

## 2015-05-17 NOTE — ED Notes (Signed)
Pt ambulated to restroom. 

## 2015-05-17 NOTE — Discharge Instructions (Signed)
Alcohol Intoxication  Alcohol intoxication occurs when you drink enough alcohol that it affects your ability to function. It can be mild or very severe. Drinking a lot of alcohol in a short time is called binge drinking. This can be very harmful. Drinking alcohol can also be more dangerous if you are taking medicines or other drugs. Some of the effects caused by alcohol may include:  · Loss of coordination.  · Changes in mood and behavior.  · Unclear thinking.  · Trouble talking (slurred speech).  · Throwing up (vomiting).  · Confusion.  · Slowed breathing.  · Twitching and shaking (seizures).  · Loss of consciousness.  HOME CARE  · Do not drive after drinking alcohol.  · Drink enough water and fluids to keep your pee (urine) clear or pale yellow. Avoid caffeine.  · Only take medicine as told by your doctor.  GET HELP IF:  · You throw up (vomit) many times.  · You do not feel better after a few days.  · You frequently have alcohol intoxication. Your doctor can help decide if you should see a substance use treatment counselor.  GET HELP RIGHT AWAY IF:  · You become shaky when you stop drinking.  · You have twitching and shaking.  · You throw up blood. It may look bright red or like coffee grounds.  · You notice blood in your poop (bowel movements).  · You become lightheaded or pass out (faint).  MAKE SURE YOU:   · Understand these instructions.  · Will watch your condition.  · Will get help right away if you are not doing well or get worse.     This information is not intended to replace advice given to you by your health care provider. Make sure you discuss any questions you have with your health care provider.     Document Released: 07/02/2007 Document Revised: 09/15/2012 Document Reviewed: 06/18/2012  Elsevier Interactive Patient Education ©2016 [employer]

## 2015-05-17 NOTE — ED Provider Notes (Signed)
LACERATION REPAIR Performed by: Cheri FowlerKayla Antino Mayabb Consent: Verbal consent obtained. Risks and benefits: risks, benefits and alternatives were discussed Patient identity confirmed: provided demographic data Time out performed prior to procedure Prepped and Draped in normal sterile fashion Wound explored Laceration Location: right forehead at hair line Laceration Length: 2 cm No Foreign Bodies seen or palpated Anesthesia: local infiltration Local anesthetic: lidocaine 2% with epinephrine Anesthetic total: 5 ml Irrigation method: syringe Amount of cleaning: standard Skin closure: 6-0 prolene Number of sutures or staples: 4 Technique: simple interrupted Patient tolerance: Patient tolerated the procedure well with no immediate complications.   Cheri FowlerKayla Ema Hebner, PA-C 05/17/15 [id number]  Nelva Nayobert Beaton, MD 05/19/15 [phone]

## 2015-05-28 ENCOUNTER — Emergency Department (HOSPITAL_COMMUNITY)
Admission: EM | Admit: 2015-05-28 | Discharge: 2015-05-28 | Disposition: A | Discharge status: Home / Self Care | Payer: BLUE CROSS/BLUE SHIELD | Source: Home / Self Care | Attending: Emergency Medicine | Admitting: Emergency Medicine

## 2015-05-28 ENCOUNTER — Encounter (HOSPITAL_COMMUNITY): Payer: Self-pay | Admitting: Emergency Medicine

## 2015-05-28 DIAGNOSIS — Z9114 Patient's other noncompliance with medication regimen: Secondary | ICD-10-CM

## 2015-05-28 DIAGNOSIS — K7010 Alcoholic hepatitis without ascites: Secondary | ICD-10-CM | POA: Diagnosis present

## 2015-05-28 DIAGNOSIS — Z793 Long term (current) use of hormonal contraceptives: Secondary | ICD-10-CM

## 2015-05-28 DIAGNOSIS — W19XXXA Unspecified fall, initial encounter: Secondary | ICD-10-CM | POA: Diagnosis present

## 2015-05-28 DIAGNOSIS — Y908 Blood alcohol level of 240 mg/100 ml or more: Secondary | ICD-10-CM | POA: Diagnosis present

## 2015-05-28 DIAGNOSIS — F10120 Alcohol abuse with intoxication, uncomplicated: Secondary | ICD-10-CM | POA: Insufficient documentation

## 2015-05-28 DIAGNOSIS — F329 Major depressive disorder, single episode, unspecified: Secondary | ICD-10-CM | POA: Insufficient documentation

## 2015-05-28 DIAGNOSIS — F4310 Post-traumatic stress disorder, unspecified: Secondary | ICD-10-CM | POA: Insufficient documentation

## 2015-05-28 DIAGNOSIS — F419 Anxiety disorder, unspecified: Secondary | ICD-10-CM | POA: Diagnosis present

## 2015-05-28 DIAGNOSIS — K219 Gastro-esophageal reflux disease without esophagitis: Secondary | ICD-10-CM | POA: Diagnosis present

## 2015-05-28 DIAGNOSIS — F10239 Alcohol dependence with withdrawal, unspecified: Secondary | ICD-10-CM | POA: Diagnosis not present

## 2015-05-28 DIAGNOSIS — R000 Tachycardia, unspecified: Secondary | ICD-10-CM | POA: Diagnosis present

## 2015-05-28 DIAGNOSIS — F10920 Alcohol use, unspecified with intoxication, uncomplicated: Secondary | ICD-10-CM

## 2015-05-28 DIAGNOSIS — Z79899 Other long term (current) drug therapy: Secondary | ICD-10-CM | POA: Insufficient documentation

## 2015-05-28 DIAGNOSIS — I10 Essential (primary) hypertension: Secondary | ICD-10-CM | POA: Diagnosis present

## 2015-05-28 DIAGNOSIS — F10229 Alcohol dependence with intoxication, unspecified: Secondary | ICD-10-CM | POA: Diagnosis present

## 2015-05-28 DIAGNOSIS — F319 Bipolar disorder, unspecified: Secondary | ICD-10-CM | POA: Diagnosis present

## 2015-05-28 DIAGNOSIS — M549 Dorsalgia, unspecified: Secondary | ICD-10-CM | POA: Diagnosis present

## 2015-05-28 DIAGNOSIS — Z888 Allergy status to other drugs, medicaments and biological substances status: Secondary | ICD-10-CM

## 2015-05-28 LAB — RAPID URINE DRUG SCREEN, HOSP PERFORMED
Amphetamines: NOT DETECTED
BENZODIAZEPINES: POSITIVE — AB
Barbiturates: NOT DETECTED
Cocaine: NOT DETECTED
Opiates: NOT DETECTED
Tetrahydrocannabinol: NOT DETECTED

## 2015-05-28 LAB — COMPREHENSIVE METABOLIC PANEL
ALK PHOS: 43 U/L (ref 38–126)
ALT: 412 U/L — ABNORMAL HIGH (ref 14–54)
ANION GAP: 25 — AB (ref 5–15)
AST: 295 U/L — ABNORMAL HIGH (ref 15–41)
Albumin: 4.7 g/dL (ref 3.5–5.0)
BUN: 14 mg/dL (ref 6–20)
CALCIUM: 9 mg/dL (ref 8.9–10.3)
CO2: 15 mmol/L — AB (ref 22–32)
Chloride: 101 mmol/L (ref 101–111)
Creatinine, Ser: 0.65 mg/dL (ref 0.44–1.00)
GFR calc non Af Amer: >60 mL/min (ref >60)
Glucose, Bld: 75 mg/dL (ref 65–99)
Potassium: 4 mmol/L (ref 3.5–5.1)
SODIUM: 141 mmol/L (ref 135–145)
TOTAL PROTEIN: 8.7 g/dL — AB (ref 6.5–8.1)
Total Bilirubin: 0.3 mg/dL (ref 0.3–1.2)

## 2015-05-28 LAB — CBC
HCT: 44.1 % (ref 36.0–46.0)
Hemoglobin: 15.1 g/dL — ABNORMAL HIGH (ref 12.0–15.0)
MCH: 29.6 pg (ref 26.0–34.0)
MCHC: 34.2 g/dL (ref 30.0–36.0)
MCV: 86.5 fL (ref 78.0–100.0)
Platelets: 395 10*3/uL (ref 150–400)
RBC: 5.1 MIL/uL (ref 3.87–5.11)
RDW: 14.1 % (ref 11.5–15.5)
WBC: 6.6 10*3/uL (ref 4.0–10.5)

## 2015-05-28 LAB — SALICYLATE LEVEL

## 2015-05-28 LAB — ETHANOL: Alcohol, Ethyl (B): 396 mg/dL (ref <5)

## 2015-05-28 LAB — ACETAMINOPHEN LEVEL

## 2015-05-28 MED ORDER — SODIUM CHLORIDE 0.9 % IV BOLUS (SEPSIS)
1000.0000 mL | Freq: Once | INTRAVENOUS | Status: AC
  Administered 2015-05-28: 1000 mL via INTRAVENOUS

## 2015-05-28 NOTE — ED Notes (Signed)
Mother called and report that pt just recently came home (Thursday) from alcohol detox program and she started back drinking x2 days later.

## 2015-05-28 NOTE — ED Notes (Signed)
Pt arrived via EMS with report of found unresponsive via neighbor requiring painful stimuli initially and became arousable to verbal stimuli. EMS reported noting 1/2 gallon Vodka at bedside that pt reported drinking this morning. Pt denies taking any medication, SI/HI and AVH.

## 2015-05-28 NOTE — Discharge Instructions (Signed)
Alcohol Intoxication  Alcohol intoxication occurs when you drink enough alcohol that it affects your ability to function. It can be mild or very severe. Drinking a lot of alcohol in a short time is called binge drinking. This can be very harmful. Drinking alcohol can also be more dangerous if you are taking medicines or other drugs. Some of the effects caused by alcohol may include:  · Loss of coordination.  · Changes in mood and behavior.  · Unclear thinking.  · Trouble talking (slurred speech).  · Throwing up (vomiting).  · Confusion.  · Slowed breathing.  · Twitching and shaking (seizures).  · Loss of consciousness.  HOME CARE  · Do not drive after drinking alcohol.  · Drink enough water and fluids to keep your pee (urine) clear or pale yellow. Avoid caffeine.  · Only take medicine as told by your doctor.  GET HELP IF:  · You throw up (vomit) many times.  · You do not feel better after a few days.  · You frequently have alcohol intoxication. Your doctor can help decide if you should see a substance use treatment counselor.  GET HELP RIGHT AWAY IF:  · You become shaky when you stop drinking.  · You have twitching and shaking.  · You throw up blood. It may look bright red or like coffee grounds.  · You notice blood in your poop (bowel movements).  · You become lightheaded or pass out (faint).  MAKE SURE YOU:   · Understand these instructions.  · Will watch your condition.  · Will get help right away if you are not doing well or get worse.     This information is not intended to replace advice given to you by your health care provider. Make sure you discuss any questions you have with your health care provider.     Document Released: 07/02/2007 Document Revised: 09/15/2012 Document Reviewed: 06/18/2012  Elsevier Interactive Patient Education ©2016 [employer]

## 2015-05-28 NOTE — ED Provider Notes (Signed)
Pt now sober and requesting d/c home.  Gwyneth SproutWhitney Ailyne Pawley, MD 05/28/15 2018

## 2015-05-28 NOTE — ED Notes (Signed)
Bed: [id number] Expected date:  Expected time:  Means of arrival:  Comments: EMS- unresponsive/ETOH?

## 2015-05-28 NOTE — ED Notes (Signed)
Pt given water to drink and tolerated well. 

## 2015-05-28 NOTE — ED Provider Notes (Signed)
CSN: [id number]     Arrival date & time 05/28/15  1342 History   First MD Initiated Contact with Patient 05/28/15 1352     Chief Complaint  Patient presents with  . Altered Mental Status     (Consider location/radiation/quality/duration/timing/severity/associated sxs/prior Treatment) Patient is a 35 y.o. female presenting with altered mental status. The history is provided by the patient and the EMS personnel. The history is limited by the condition of the patient.  Altered Mental Status Patient w hx etoh abuse, arrives via ems, neighbor had called as patient with altered mental status, decreased responsiveness. Per report had drank large quantity vodka this AM, there was empty 1/2 galloon container at home. Patient poorly cooperative w hx/intoxicated - level 5 caveat.  Patient denies thoughts of harming self. Denies other med/drug use this AM. No report of trauma/injury/fall.      Past Medical History  Diagnosis Date  . Alcoholic (HCC)   . Anxiety   . Post traumatic stress disorder (PTSD)   . Alcohol related seizure (HCC)   . Bipolar disorder (HCC)   . Tension headache, chronic     "monthly" (01/31/2015)  . Depression    Past Surgical History  Procedure Laterality Date  . Osteochondroma excision Right ~ 1996    "knee"  . Excisional hemorrhoidectomy  20111  . Refractive surgery Bilateral ~ 2011   Family History  Problem Relation Age of Onset  . Anxiety disorder Mother   . Anxiety disorder Sister   . Anxiety disorder Maternal Grandmother   . Anxiety disorder Father   . Alcohol abuse Father   . Alcohol abuse Brother   . Alcohol abuse Paternal Uncle    Social History  Substance Use Topics  . Smoking status: Never Smoker   . Smokeless tobacco: Never Used  . Alcohol Use: 162.6 oz/week    158 Glasses of wine, 113 Shots of liquor per week     Comment: 01/31/2015 "4 bottles of wine & 1 fifth of vodka daily"   OB History    No data available       Review of Systems   Unable to perform ROS: Mental status change  patient w altered mental status/intoxication - level 5 caveat    Allergies  Naltrexone and Baclofen  Home Medications   Prior to Admission medications   Medication Sig Start Date End Date Taking? Authorizing Provider  acamprosate (CAMPRAL) 333 MG tablet Take 2 tablets (666 mg total) by mouth 3 (three) times daily with meals. 02/08/15   Adonis Brook, NP  folic acid (FOLVITE) 1 MG tablet Take 1 tablet (1 mg total) by mouth daily. 02/19/15   Su Hoff, MD  hydrOXYzine (ATARAX/VISTARIL) 25 MG tablet Take 1 tablet (25 mg total) by mouth every 4 (four) hours as needed for anxiety. 02/08/15   Adonis Brook, NP  lamoTRIgine (LAMICTAL) 25 MG tablet Take 1 tablet (25 mg total) by mouth daily. 02/08/15   Adonis Brook, NP  lurasidone (LATUDA) 80 MG TABS tablet Take 1 tablet (80 mg total) by mouth at bedtime. 02/08/15   Adonis Brook, NP  Multiple Vitamin (MULTIVITAMIN WITH MINERALS) TABS tablet Take 1 tablet by mouth daily. 02/19/15   Carly Arlyce Harman, MD  prazosin (MINIPRESS) 1 MG capsule Take 3 capsules (3 mg total) by mouth at bedtime. 02/08/15   Adonis Brook, NP  thiamine 100 MG tablet Take 1 tablet (100 mg total) by mouth daily. 02/19/15   Su Hoff, MD   BP 121/75 mmHg  Pulse 128  Temp(Src) 98.8 F (37.1 C) (Oral)  Resp 16  SpO2 95% Physical Exam  Constitutional: She appears well-developed and well-nourished. No distress.  HENT:  Head: Atraumatic.  Mouth/Throat: Oropharynx is clear and moist.  Eyes: Conjunctivae are normal. Pupils are equal, round, and reactive to light. No scleral icterus.  Neck: Neck supple. No tracheal deviation present.  No stiffness or rigidity  Cardiovascular: Regular rhythm, normal heart sounds and intact distal pulses.  Exam reveals no gallop and no friction rub.   No murmur heard. Pulmonary/Chest: Effort normal and breath sounds normal. No respiratory distress.  Abdominal: Soft. Normal appearance and bowel  sounds are normal. She exhibits no distension. There is no tenderness.  Genitourinary:  No cva tenderness  Musculoskeletal: She exhibits no edema or tenderness.  Neurological:  Patient lethargic, easily aroused. Moves bil ext purposefully w good strength. Poor cooperation w exam.   Skin: Skin is warm and dry. No rash noted. She is not diaphoretic.  Psychiatric:  Intoxicated.   Nursing note and vitals reviewed.   ED Course  Procedures (including critical care time) Labs Review  Results for orders placed or performed during the hospital encounter of 05/28/15  CBC  Result Value Ref Range   WBC 6.6 4.0 - 10.5 K/uL   RBC 5.10 3.87 - 5.11 MIL/uL   Hemoglobin 15.1 (H) 12.0 - 15.0 g/dL   HCT 16.144.1 09.636.0 - 04.546.0 %   MCV 86.5 78.0 - 100.0 fL   MCH 29.6 26.0 - 34.0 pg   MCHC 34.2 30.0 - 36.0 g/dL   RDW 40.914.1 81.111.5 - 91.415.5 %   Platelets 395 150 - 400 K/uL  Comprehensive metabolic panel  Result Value Ref Range   Sodium 141 135 - 145 mmol/L   Potassium 4.0 3.5 - 5.1 mmol/L   Chloride 101 101 - 111 mmol/L   CO2 15 (L) 22 - 32 mmol/L   Glucose, Bld 75 65 - 99 mg/dL   BUN 14 6 - 20 mg/dL   Creatinine, Ser 7.820.65 0.44 - 1.00 mg/dL   Calcium 9.0 8.9 - 95.610.3 mg/dL   Total Protein 8.7 (H) 6.5 - 8.1 g/dL   Albumin 4.7 3.5 - 5.0 g/dL   AST 213295 (H) 15 - 41 U/L   ALT 412 (H) 14 - 54 U/L   Alkaline Phosphatase 43 38 - 126 U/L   Total Bilirubin 0.3 0.3 - 1.2 mg/dL   GFR calc non Af Amer >60 >60 mL/min   GFR calc Af Amer >60 >60 mL/min   Anion gap 25 (H) 5 - 15  Ethanol  Result Value Ref Range   Alcohol, Ethyl (B) 396 (HH) <5 mg/dL  Acetaminophen level  Result Value Ref Range   Acetaminophen (Tylenol), Serum <10 (L) 10 - 30 ug/mL  Salicylate level  Result Value Ref Range   Salicylate Lvl <4.0 2.8 - 30.0 mg/dL      I have personally reviewed and evaluated these images and lab results as part of my medical decision-making.    MDM   Iv ns bolus. Labs.  Reviewed nursing notes and prior  charts for additional history.   Iv ns bolus.   Additional iv ns.  Etoh very high, 396.  No new c/o or change in exam from prior.  Plan for reassessment and disposition when more sober.  Signed out to Dr Anitra LauthPlunkett at 1600 to reassess when sober and dispo appropriately.      Cathren LaineKevin Naiyana Barbian, MD 05/29/15 [phone]

## 2015-05-29 ENCOUNTER — Inpatient Hospital Stay (HOSPITAL_COMMUNITY): Payer: BLUE CROSS/BLUE SHIELD

## 2015-05-29 ENCOUNTER — Inpatient Hospital Stay (HOSPITAL_COMMUNITY)
Admission: EM | Admit: 2015-05-29 | Discharge: 2015-06-07 | DRG: 897 | Disposition: A | Discharge status: Rehabilitation Facility | Payer: BLUE CROSS/BLUE SHIELD | Attending: Internal Medicine | Admitting: Internal Medicine

## 2015-05-29 ENCOUNTER — Encounter (HOSPITAL_COMMUNITY): Payer: Self-pay | Admitting: Family Medicine

## 2015-05-29 ENCOUNTER — Emergency Department (HOSPITAL_COMMUNITY): Payer: BLUE CROSS/BLUE SHIELD

## 2015-05-29 DIAGNOSIS — Z9114 Patient's other noncompliance with medication regimen: Secondary | ICD-10-CM | POA: Diagnosis not present

## 2015-05-29 DIAGNOSIS — F329 Major depressive disorder, single episode, unspecified: Secondary | ICD-10-CM

## 2015-05-29 DIAGNOSIS — F10239 Alcohol dependence with withdrawal, unspecified: Secondary | ICD-10-CM | POA: Diagnosis present

## 2015-05-29 DIAGNOSIS — Z79899 Other long term (current) drug therapy: Secondary | ICD-10-CM | POA: Diagnosis not present

## 2015-05-29 DIAGNOSIS — F4310 Post-traumatic stress disorder, unspecified: Secondary | ICD-10-CM | POA: Diagnosis present

## 2015-05-29 DIAGNOSIS — R748 Abnormal levels of other serum enzymes: Secondary | ICD-10-CM

## 2015-05-29 DIAGNOSIS — Z888 Allergy status to other drugs, medicaments and biological substances status: Secondary | ICD-10-CM | POA: Diagnosis not present

## 2015-05-29 DIAGNOSIS — F10230 Alcohol dependence with withdrawal, uncomplicated: Secondary | ICD-10-CM | POA: Diagnosis not present

## 2015-05-29 DIAGNOSIS — M549 Dorsalgia, unspecified: Secondary | ICD-10-CM | POA: Diagnosis present

## 2015-05-29 DIAGNOSIS — I10 Essential (primary) hypertension: Secondary | ICD-10-CM | POA: Diagnosis present

## 2015-05-29 DIAGNOSIS — F10229 Alcohol dependence with intoxication, unspecified: Secondary | ICD-10-CM | POA: Diagnosis present

## 2015-05-29 DIAGNOSIS — Z793 Long term (current) use of hormonal contraceptives: Secondary | ICD-10-CM | POA: Diagnosis not present

## 2015-05-29 DIAGNOSIS — F319 Bipolar disorder, unspecified: Secondary | ICD-10-CM | POA: Diagnosis present

## 2015-05-29 DIAGNOSIS — F32A Depression, unspecified: Secondary | ICD-10-CM

## 2015-05-29 DIAGNOSIS — F419 Anxiety disorder, unspecified: Secondary | ICD-10-CM | POA: Diagnosis present

## 2015-05-29 DIAGNOSIS — W19XXXA Unspecified fall, initial encounter: Secondary | ICD-10-CM

## 2015-05-29 DIAGNOSIS — K219 Gastro-esophageal reflux disease without esophagitis: Secondary | ICD-10-CM

## 2015-05-29 DIAGNOSIS — K7010 Alcoholic hepatitis without ascites: Secondary | ICD-10-CM | POA: Diagnosis present

## 2015-05-29 DIAGNOSIS — F10939 Alcohol use, unspecified with withdrawal, unspecified: Secondary | ICD-10-CM | POA: Diagnosis present

## 2015-05-29 DIAGNOSIS — F1020 Alcohol dependence, uncomplicated: Secondary | ICD-10-CM | POA: Diagnosis present

## 2015-05-29 DIAGNOSIS — R000 Tachycardia, unspecified: Secondary | ICD-10-CM | POA: Diagnosis present

## 2015-05-29 DIAGNOSIS — F10220 Alcohol dependence with intoxication, uncomplicated: Secondary | ICD-10-CM | POA: Diagnosis not present

## 2015-05-29 DIAGNOSIS — Y908 Blood alcohol level of 240 mg/100 ml or more: Secondary | ICD-10-CM | POA: Diagnosis present

## 2015-05-29 LAB — COMPREHENSIVE METABOLIC PANEL
ALK PHOS: 46 U/L (ref 38–126)
ALT: 436 U/L — AB (ref 14–54)
ANION GAP: 20 — AB (ref 5–15)
AST: 413 U/L — ABNORMAL HIGH (ref 15–41)
Albumin: 4.9 g/dL (ref 3.5–5.0)
BUN: 6 mg/dL (ref 6–20)
CALCIUM: 8.9 mg/dL (ref 8.9–10.3)
CO2: 21 mmol/L — ABNORMAL LOW (ref 22–32)
CREATININE: 0.61 mg/dL (ref 0.44–1.00)
Chloride: 104 mmol/L (ref 101–111)
Glucose, Bld: 88 mg/dL (ref 65–99)
Potassium: 4.1 mmol/L (ref 3.5–5.1)
SODIUM: 145 mmol/L (ref 135–145)
Total Bilirubin: 0.7 mg/dL (ref 0.3–1.2)
Total Protein: 9.2 g/dL — ABNORMAL HIGH (ref 6.5–8.1)

## 2015-05-29 LAB — URINE MICROSCOPIC-ADD ON

## 2015-05-29 LAB — CBC WITH DIFFERENTIAL/PLATELET
Basophils Absolute: 0 10*3/uL (ref 0.0–0.1)
Basophils Relative: 1 %
EOS ABS: 0 10*3/uL (ref 0.0–0.7)
EOS PCT: 0 %
HCT: 44.6 % (ref 36.0–46.0)
HEMOGLOBIN: 15.3 g/dL — AB (ref 12.0–15.0)
LYMPHS ABS: 3.4 10*3/uL (ref 0.7–4.0)
LYMPHS PCT: 48 %
MCH: 29.6 pg (ref 26.0–34.0)
MCHC: 34.3 g/dL (ref 30.0–36.0)
MCV: 86.3 fL (ref 78.0–100.0)
MONOS PCT: 12 %
Monocytes Absolute: 0.8 10*3/uL (ref 0.1–1.0)
Neutro Abs: 2.8 10*3/uL (ref 1.7–7.7)
Neutrophils Relative %: 39 %
PLATELETS: 436 10*3/uL — AB (ref 150–400)
RBC: 5.17 MIL/uL — AB (ref 3.87–5.11)
RDW: 14.4 % (ref 11.5–15.5)
WBC: 7 10*3/uL (ref 4.0–10.5)

## 2015-05-29 LAB — URINALYSIS, ROUTINE W REFLEX MICROSCOPIC
BILIRUBIN URINE: NEGATIVE
Glucose, UA: NEGATIVE mg/dL
KETONES UR: 40 mg/dL — AB
LEUKOCYTES UA: NEGATIVE
NITRITE: NEGATIVE
PROTEIN: 100 mg/dL — AB
Specific Gravity, Urine: 1.013 (ref 1.005–1.030)
pH: 5 (ref 5.0–8.0)

## 2015-05-29 LAB — RAPID URINE DRUG SCREEN, HOSP PERFORMED
Amphetamines: NOT DETECTED
Barbiturates: NOT DETECTED
Benzodiazepines: POSITIVE — AB
Cocaine: NOT DETECTED
OPIATES: NOT DETECTED
Tetrahydrocannabinol: NOT DETECTED

## 2015-05-29 LAB — SALICYLATE LEVEL

## 2015-05-29 LAB — ACETAMINOPHEN LEVEL

## 2015-05-29 LAB — ETHANOL: ALCOHOL ETHYL (B): 484 mg/dL — AB (ref <5)

## 2015-05-29 LAB — PREGNANCY, URINE: PREG TEST UR: NEGATIVE

## 2015-05-29 LAB — LIPASE, BLOOD: LIPASE: 40 U/L (ref 11–51)

## 2015-05-29 MED ORDER — LORAZEPAM 2 MG/ML IJ SOLN: 0.0000 mg | Freq: Two times a day (BID) | INTRAMUSCULAR | Status: DC

## 2015-05-29 MED ORDER — NAPHAZOLINE HCL 0.1 % OP SOLN
1.0000 [drp] | Freq: Four times a day (QID) | OPHTHALMIC | Status: DC | PRN
  Filled 2015-05-29: qty 15

## 2015-05-29 MED ORDER — MORPHINE SULFATE (PF) 2 MG/ML IV SOLN
2.0000 mg | Freq: Once | INTRAVENOUS | Status: AC
  Administered 2015-05-29: 2 mg via INTRAVENOUS
  Filled 2015-05-29: qty 1

## 2015-05-29 MED ORDER — BUSPIRONE HCL 10 MG PO TABS
10.0000 mg | ORAL_TABLET | Freq: Three times a day (TID) | ORAL | Status: DC
  Administered 2015-05-30 – 2015-06-06 (×24): 10 mg via ORAL
  Filled 2015-05-29 (×28): qty 1

## 2015-05-29 MED ORDER — SODIUM CHLORIDE 0.9 % IV BOLUS (SEPSIS)
1000.0000 mL | Freq: Once | INTRAVENOUS | Status: AC
  Administered 2015-05-29: 1000 mL via INTRAVENOUS

## 2015-05-29 MED ORDER — PRAZOSIN HCL 1 MG PO CAPS
3.0000 mg | ORAL_CAPSULE | Freq: Every day | ORAL | Status: DC
  Administered 2015-05-30 – 2015-06-04 (×6): 3 mg via ORAL
  Filled 2015-05-29 (×8): qty 1

## 2015-05-29 MED ORDER — TIZANIDINE HCL 2 MG PO TABS
2.0000 mg | ORAL_TABLET | Freq: Two times a day (BID) | ORAL | Status: DC | PRN
  Administered 2015-06-02 – 2015-06-06 (×6): 2 mg via ORAL
  Filled 2015-05-29 (×9): qty 1

## 2015-05-29 MED ORDER — SODIUM CHLORIDE 0.9 % IV SOLN
INTRAVENOUS | Status: DC
  Administered 2015-05-30 – 2015-06-02 (×6): via INTRAVENOUS

## 2015-05-29 MED ORDER — LURASIDONE HCL 80 MG PO TABS
80.0000 mg | ORAL_TABLET | Freq: Every day | ORAL | Status: DC
  Administered 2015-05-30 – 2015-06-06 (×8): 80 mg via ORAL
  Filled 2015-05-29 (×10): qty 1

## 2015-05-29 MED ORDER — LORAZEPAM 2 MG/ML IJ SOLN
1.0000 mg | Freq: Once | INTRAMUSCULAR | Status: AC
  Administered 2015-05-29: 1 mg via INTRAVENOUS
  Filled 2015-05-29: qty 1

## 2015-05-29 MED ORDER — GABAPENTIN 100 MG PO CAPS
100.0000 mg | ORAL_CAPSULE | Freq: Three times a day (TID) | ORAL | Status: DC
  Administered 2015-05-30 – 2015-06-06 (×24): 100 mg via ORAL
  Filled 2015-05-29 (×29): qty 1

## 2015-05-29 MED ORDER — ACETAMINOPHEN 650 MG RE SUPP: 650.0000 mg | Freq: Four times a day (QID) | RECTAL | Status: DC | PRN

## 2015-05-29 MED ORDER — VITAMIN B-1 100 MG PO TABS: 100.0000 mg | ORAL_TABLET | Freq: Every day | ORAL | Status: DC

## 2015-05-29 MED ORDER — LORAZEPAM 2 MG/ML IJ SOLN
0.0000 mg | Freq: Four times a day (QID) | INTRAMUSCULAR | Status: DC
  Administered 2015-05-30: 1 mg via INTRAVENOUS
  Administered 2015-05-30: 2 mg via INTRAVENOUS
  Filled 2015-05-29 (×2): qty 1

## 2015-05-29 MED ORDER — PANTOPRAZOLE SODIUM 40 MG PO TBEC
40.0000 mg | DELAYED_RELEASE_TABLET | Freq: Every day | ORAL | Status: DC
  Administered 2015-05-30 – 2015-06-06 (×8): 40 mg via ORAL
  Filled 2015-05-29 (×9): qty 1

## 2015-05-29 MED ORDER — SODIUM CHLORIDE 0.9 % IV BOLUS (SEPSIS)
2000.0000 mL | Freq: Once | INTRAVENOUS | Status: AC
  Administered 2015-05-29: 2000 mL via INTRAVENOUS

## 2015-05-29 MED ORDER — ONDANSETRON HCL 4 MG/2ML IJ SOLN
4.0000 mg | Freq: Four times a day (QID) | INTRAMUSCULAR | Status: DC | PRN
  Administered 2015-05-30 (×2): 4 mg via INTRAVENOUS
  Filled 2015-05-29 (×2): qty 2

## 2015-05-29 MED ORDER — FOLIC ACID 1 MG PO TABS
1.0000 mg | ORAL_TABLET | Freq: Every day | ORAL | Status: DC
  Administered 2015-05-30 – 2015-06-06 (×8): 1 mg via ORAL
  Filled 2015-05-29 (×9): qty 1

## 2015-05-29 MED ORDER — ADULT MULTIVITAMIN W/MINERALS CH: 1.0000 | ORAL_TABLET | Freq: Every day | ORAL | Status: DC

## 2015-05-29 MED ORDER — ACETAMINOPHEN 325 MG PO TABS
650.0000 mg | ORAL_TABLET | Freq: Four times a day (QID) | ORAL | Status: DC | PRN
  Administered 2015-06-01 – 2015-06-05 (×3): 650 mg via ORAL
  Filled 2015-05-29 (×3): qty 2

## 2015-05-29 MED ORDER — OXYCODONE HCL 5 MG PO TABS
5.0000 mg | ORAL_TABLET | ORAL | Status: DC | PRN
  Administered 2015-05-30 – 2015-06-06 (×16): 5 mg via ORAL
  Filled 2015-05-29 (×16): qty 1

## 2015-05-29 MED ORDER — LAMOTRIGINE 25 MG PO TABS
50.0000 mg | ORAL_TABLET | Freq: Every morning | ORAL | Status: DC
  Administered 2015-05-30 – 2015-06-06 (×8): 50 mg via ORAL
  Filled 2015-05-29 (×9): qty 2

## 2015-05-29 MED ORDER — SODIUM CHLORIDE 0.9% FLUSH
3.0000 mL | Freq: Two times a day (BID) | INTRAVENOUS | Status: DC
  Administered 2015-05-30 – 2015-06-01 (×5): 3 mL via INTRAVENOUS

## 2015-05-29 MED ORDER — ONDANSETRON HCL 4 MG PO TABS
4.0000 mg | ORAL_TABLET | Freq: Four times a day (QID) | ORAL | Status: DC | PRN
  Administered 2015-06-01: 4 mg via ORAL
  Filled 2015-05-29: qty 1

## 2015-05-29 NOTE — ED Notes (Signed)
Attempted to obtain urine sample by placing patient on a bedpan at her request of needing urinate. Pt reports she is unable to urinate using a bedpan.

## 2015-05-29 NOTE — H&P (Signed)
History and Physical    [name] Stewart WUJ:[id number] DOB: 09/19/1980 DOA: 05/29/2015  Referring MD/NP/PA: ED PCP: Verlon AuBoyd, Tammy Lamonica, MD  Patient coming from: Home  Chief Complaint: Referred by family after being found down and acutely intoxicated  HPI: [name] Stewart is a 35 y.o. woman with a history of EtOH dependence, prior detox on multiple occasions ("Too many to count" per the patient), depression, PTSD, and Bipolar disorder who admits that she has relapsed severely with her EtOH use since the death of her boyfriend three weeks ago secondary to EtOH overdose.  Per ED documentation, family was present earlier and report that the patient just left an inpatient treatment facility two days ago.  When asked how much she has had to drink in the past 24 hours, she says, "I have no idea".  When asked if she has had suicidal ideation, she pauses then says, "I don't know; I am not able to function".  She becomes very tearful during the interview.  She knows that she fell today and is complaining of right upper back pain.  No chest pain or shortness of breath.  No pain with deep inspiration.  No abdominal pain.  No nausea or vomiting.  No lower urinary tract symptoms.  No change in her bowel habits.  She admits that she has not been eating well.  ED Course: As the patient has sobered up, she is showing signs of acute EtOH withdrawal with tremors and tachycardia.  She denies history of EtOH withdrawal seizure.  She has had 3L of NS and at least one dose of IV ativan.  She is complaining fo back pain.  Hospitalist asked to admit for management per CIWA protocol.  I believe she is high risk, so sitter has also been requested.  Routine psych consult has been placed via their consult line.  Review of Systems: As per HPI otherwise 10 point review of systems negative.   Past Medical History  Diagnosis Date  . Alcoholic (HCC)   . Anxiety   . Post traumatic stress disorder (PTSD)   . Alcohol related seizure (HCC)   .  Bipolar disorder (HCC)   . Tension headache, chronic     "monthly" (01/31/2015)  . Depression     Past Surgical History  Procedure Laterality Date  . Osteochondroma excision Right ~ 1996    "knee"  . Excisional hemorrhoidectomy  20111  . Refractive surgery Bilateral ~ 2011     reports that she has never smoked. She has never used smokeless tobacco. She reports that she drinks alcohol. She reports that she uses illicit drugs.  Again denies tobacco use.  She has known EtOH dependence.  She admits to heroine use in September.  Allergies  Allergen Reactions  . Naltrexone Other (See Comments)    Makes her hurt everywhere. Anything higher than 25mg  gives her the issues.. Can take in smaller doses  . Baclofen Rash    Family History  Problem Relation Age of Onset  . Anxiety disorder Mother   . Anxiety disorder Sister   . Anxiety disorder Maternal Grandmother   . Anxiety disorder Father   . Alcohol abuse Father   . Alcohol abuse Brother   . Alcohol abuse Paternal Uncle    Prior to Admission medications   Medication Sig Start Date End Date Taking? Authorizing Provider  busPIRone (BUSPAR) 10 MG tablet Take 10 mg by mouth 3 (three) times daily.   Yes Historical Provider, MD  clonazePAM (KLONOPIN) 1 MG tablet Take  1 mg by mouth at bedtime.   Yes Historical Provider, MD  gabapentin (NEURONTIN) 100 MG capsule Take 100 mg by mouth 3 (three) times daily.   Yes Historical Provider, MD  hydrOXYzine (ATARAX/VISTARIL) 25 MG tablet Take 1 tablet (25 mg total) by mouth every 4 (four) hours as needed for anxiety. 02/08/15  Yes Adonis Brook, NP  ketorolac (TORADOL) 10 MG tablet Take 10 mg by mouth 2 (two) times daily as needed (pain.).   Yes Historical Provider, MD  lamoTRIgine (LAMICTAL) 25 MG tablet Take 1 tablet (25 mg total) by mouth daily. Patient taking differently: Take 50 mg by mouth every morning.  02/08/15  Yes Adonis Brook, NP  Levonorgestrel-Ethinyl Estradiol (CAMRESE) 0.15-0.03 &0.01  MG tablet Take 1 tablet by mouth daily.   Yes Historical Provider, MD  lurasidone (LATUDA) 80 MG TABS tablet Take 1 tablet (80 mg total) by mouth at bedtime. 02/08/15  Yes Adonis Brook, NP  Multiple Vitamin (MULTIVITAMIN WITH MINERALS) TABS tablet Take 1 tablet by mouth daily. 02/19/15  Yes Carly Arlyce Harman, MD  naphazoline-glycerin (CLEAR EYES) 0.012-0.2 % SOLN Place 1 drop into both eyes 4 (four) times daily as needed for irritation.   Yes Historical Provider, MD  prazosin (MINIPRESS) 1 MG capsule Take 3 capsules (3 mg total) by mouth at bedtime. 02/08/15  Yes Adonis Brook, NP  tiZANidine (ZANAFLEX) 2 MG tablet Take 2 mg by mouth 2 (two) times daily as needed for muscle spasms.   Yes Historical Provider, MD  traZODone (DESYREL) 50 MG tablet Take 50 mg by mouth at bedtime as needed for sleep.   Yes Historical Provider, MD  valACYclovir (VALTREX) 500 MG tablet Take 500 mg by mouth daily as needed (Outbreaks.).   Yes Historical Provider, MD  Vilazodone HCl (VIIBRYD) 40 MG TABS Take 40 mg by mouth daily.   Yes Historical Provider, MD  acamprosate (CAMPRAL) 333 MG tablet Take 2 tablets (666 mg total) by mouth 3 (three) times daily with meals. Patient not taking: Reported on 05/28/2015 02/08/15   Adonis Brook, NP  folic acid (FOLVITE) 1 MG tablet Take 1 tablet (1 mg total) by mouth daily. Patient not taking: Reported on 05/28/2015 02/19/15   Su Hoff, MD  thiamine 100 MG tablet Take 1 tablet (100 mg total) by mouth daily. Patient not taking: Reported on 05/28/2015 02/19/15   Su Hoff, MD    Physical Exam: Filed Vitals:   05/29/15 1620 05/29/15 1829 05/29/15 2046 05/29/15 2047  BP: 101/68 103/91 122/77 122/77  Pulse: 117 118 125 125  Temp:  98.7 F (37.1 C) 98.3 F (36.8 C)   TempSrc:  Oral Oral   Resp:  20 20   SpO2:  98% 94%       Constitutional: NAD, calm but tearful Filed Vitals:   05/29/15 1620 05/29/15 1829 05/29/15 2046 05/29/15 2047  BP: 101/68 103/91 122/77 122/77  Pulse: 117  118 125 125  Temp:  98.7 F (37.1 C) 98.3 F (36.8 C)   TempSrc:  Oral Oral   Resp:  20 20   SpO2:  98% 94%    Eyes: pupils appear equal, lids and conjunctivae normal ENMT: Mucous membranes are moist. Normal dentition.  Neck: normal, supple, no masses Respiratory: clear to auscultation bilaterally, no wheezing, no crackles. Normal respiratory effort. No accessory muscle use.  Cardiovascular: Tachycardic but regular.  No murmurs / rubs / gallops. No extremity edema. 2+ pedal pulses.  Abdomen: no tenderness, no masses palpated. No hepatosplenomegaly. Bowel sounds  positive.  Musculoskeletal: no clubbing / cyanosis. No joint deformity upper and lower extremities. Good ROM, no contractures. Normal muscle tone.  Skin: no rashes, lesions, ulcers. No induration.  She has extensive bruising over her right upper back with tenderness to palpation.  No significant excoriations. Neurologic: CN 2-12 grossly intact. Sensation intact, Strength 5/5 in all 4.  Psychiatric: Alert and oriented x 3 but tearful and admits to being depressed.  "Doesn't know" if she is suicidal.  Judgement is impaired.  Labs on Admission: I have personally reviewed following labs and imaging studies  CBC:  Recent Labs Lab 05/28/15 1409 05/29/15 1658  WBC 6.6 7.0  NEUTROABS  --  2.8  HGB 15.1* 15.3*  HCT 44.1 44.6  MCV 86.5 86.3  PLT 395 436*   Basic Metabolic Panel:  Recent Labs Lab 05/28/15 1409 05/29/15 1658  NA 141 145  K 4.0 4.1  CL 101 104  CO2 15* 21*  GLUCOSE 75 88  BUN 14 6  CREATININE 0.65 0.61  CALCIUM 9.0 8.9   Liver Function Tests:  Recent Labs Lab 05/28/15 1409 05/29/15 1658  AST 295* 413*  ALT 412* 436*  ALKPHOS 43 46  BILITOT 0.3 0.7  PROT 8.7* 9.2*  ALBUMIN 4.7 4.9    Recent Labs Lab 05/29/15 1658  LIPASE 40   Urine analysis:    Component Value Date/Time   COLORURINE YELLOW 05/29/2015 1932   APPEARANCEUR CLOUDY* 05/29/2015 1932   LABSPEC 1.013 05/29/2015 1932    PHURINE 5.0 05/29/2015 1932   GLUCOSEU NEGATIVE 05/29/2015 1932   HGBUR SMALL* 05/29/2015 1932   BILIRUBINUR NEGATIVE 05/29/2015 1932   KETONESUR 40* 05/29/2015 1932   PROTEINUR 100* 05/29/2015 1932   UROBILINOGEN 0.2 12/31/2013 2136   NITRITE NEGATIVE 05/29/2015 1932   LEUKOCYTESUR NEGATIVE 05/29/2015 1932   Radiological Exams on Admission: Ct Head Wo Contrast  05/29/2015  CLINICAL DATA:  Head trauma.  Alcohol abuse. EXAM: CT HEAD WITHOUT CONTRAST TECHNIQUE: Contiguous axial images were obtained from the base of the skull through the vertex without intravenous contrast. COMPARISON:  05/16/2015 FINDINGS: There is a minimal amount of residual subcutaneous stranding at the site of the previous identified hematoma/laceration involving the right side of the forehead (image 17, series 2). Regional soft tissues appear otherwise normal. No radiopaque foreign body. No displaced calvarial fracture. Gray-white differentiation is maintained. No CT evidence of acute large territory infarct. No intraparenchymal or extra-axial mass or hemorrhage. Unchanged size and configuration of the ventricles and basilar cisterns. No midline shift. There is underpneumatization the bilateral frontal sinuses. The remaining paranasal sinuses and mastoid air cells are normal. No air-fluid levels. IMPRESSION: 1. Minimal amount of residual subcutaneous stranding at the site of previous identified hematoma/laceration involving the right side of the forehead without definitive superimposed acute traumatic injury to the head. 2. Otherwise, negative noncontrast head CT. Electronically Signed   By: Simonne Come M.D.   On: 05/29/2015 17:42    EKG: Independently reviewed. Sinus tachycardia.  No acute ST segment changes.  Assessment/Plan Principal Problem:   Alcohol withdrawal (HCC) Active Problems:   GERD (gastroesophageal reflux disease)   Alcohol dependence (HCC)   Depression  Admit to inpatient, stepdown, with sitter for suicide  precautions  Acute EtOH withdrawal --CIWA protocol --Multivitamin, folate, thiamine daily --NS at 125cc/hr --Seizure precautions --Follow LFT trend (attributed to EtOH use at this point)  Back pain S/P fall --Chest/rib xrays pending --Analgesics as needed  Extensive psych history with Bipolar disorder, PTSD, depression --Psych consult pending --  Will continue Buspar, neurontin, lamictal, and Latuda for now; otherwise, await psych recommendations.  She will definitely need inpatient transfer when medically stable.  DVT prophylaxis: Early ambulation Code Status: FULL Family Communication: Patient alone at time of admission Disposition Plan: Expect her to be here for several days Consults called: Psych consult requested via referral line Admission status: Inpatient, telemetry   Jerene Bears MD [facility]   05/29/2015, 10:37 PM

## 2015-05-29 NOTE — ED Notes (Signed)
Patient is from home and [facility]. Patient had a friend found her intoxicated earlier today and called EMS for assistance. EMS reported 1/2 bottle of vodka found bedside and pt admits to drinking the vodka. Also, admitted to EMS that she has had " a lot" of wine to drink. [facility] same scenario yesterday and discharged home once sober.

## 2015-05-29 NOTE — ED Notes (Addendum)
Spoke with patients father, Terrial RhodesKevin Carr, [phone] about patients history and his concern for her to get inpatient treatment. He reported patient's history of the inpatient treatment centers and his thoughts that she is endanger to herself due to be intoxicated so frequently.

## 2015-05-29 NOTE — ED Notes (Signed)
Sat patient up on stretcher, provided meal tray with set up to eat.

## 2015-05-29 NOTE — ED Notes (Signed)
[facility] Expected date:  Expected time:  Means of arrival:  Comments: EMS 35 y.o. ETOH

## 2015-05-29 NOTE — ED Notes (Signed)
Pt has been seen and wanded by security.  Pt has cellphone and watch.  Pt cannot remove piercings.  RN informed.

## 2015-05-29 NOTE — ED Notes (Signed)
Started the 20 minute timer with ICU, spoke with ICU charge nurse.

## 2015-05-29 NOTE — ED Provider Notes (Signed)
CSN: [id number]     Arrival date & time 05/29/15  1555 History   First MD Initiated Contact with Patient 05/29/15 1614     Chief Complaint  Patient presents with  . Alcohol Intoxication     (Consider location/radiation/quality/duration/timing/severity/associated sxs/prior Treatment) HPI Level V caveat due to alcohol intoxication. Patient presents by EMS after being found by her friend intoxicated. Was seen yesterday for similar presentation. Per family has a history of chronic alcohol abuse. Recent rehabilitation stay. Patient is drowsy but arousable. Past Medical History  Diagnosis Date  . Alcoholic (HCC)   . Anxiety   . Post traumatic stress disorder (PTSD)   . Alcohol related seizure (HCC)   . Bipolar disorder (HCC)   . Tension headache, chronic     "monthly" (01/31/2015)  . Depression    Past Surgical History  Procedure Laterality Date  . Osteochondroma excision Right ~ 1996    "knee"  . Excisional hemorrhoidectomy  20111  . Refractive surgery Bilateral ~ 2011   Family History  Problem Relation Age of Onset  . Anxiety disorder Mother   . Anxiety disorder Sister   . Anxiety disorder Maternal Grandmother   . Anxiety disorder Father   . Alcohol abuse Father   . Alcohol abuse Brother   . Alcohol abuse Paternal Uncle    Social History  Substance Use Topics  . Smoking status: Never Smoker   . Smokeless tobacco: Never Used  . Alcohol Use: Yes     Comment: Wine and Vodka. Last drink: Today   OB History    No data available     Review of Systems  Unable to perform ROS: Mental status change      Allergies  Naltrexone and Baclofen  Home Medications   Prior to Admission medications   Medication Sig Start Date End Date Taking? Authorizing Provider  busPIRone (BUSPAR) 10 MG tablet Take 10 mg by mouth 3 (three) times daily.   Yes Historical Provider, MD  clonazePAM (KLONOPIN) 1 MG tablet Take 1 mg by mouth at bedtime.   Yes Historical Provider, MD  gabapentin  (NEURONTIN) 100 MG capsule Take 100 mg by mouth 3 (three) times daily.   Yes Historical Provider, MD  hydrOXYzine (ATARAX/VISTARIL) 25 MG tablet Take 1 tablet (25 mg total) by mouth every 4 (four) hours as needed for anxiety. 02/08/15  Yes Adonis Brook, NP  ketorolac (TORADOL) 10 MG tablet Take 10 mg by mouth 2 (two) times daily as needed (pain.).   Yes Historical Provider, MD  lamoTRIgine (LAMICTAL) 25 MG tablet Take 1 tablet (25 mg total) by mouth daily. Patient taking differently: Take 50 mg by mouth every morning.  02/08/15  Yes Adonis Brook, NP  Levonorgestrel-Ethinyl Estradiol (CAMRESE) 0.15-0.03 &0.01 MG tablet Take 1 tablet by mouth daily.   Yes Historical Provider, MD  lurasidone (LATUDA) 80 MG TABS tablet Take 1 tablet (80 mg total) by mouth at bedtime. 02/08/15  Yes Adonis Brook, NP  Multiple Vitamin (MULTIVITAMIN WITH MINERALS) TABS tablet Take 1 tablet by mouth daily. 02/19/15  Yes Carly Arlyce Harman, MD  naphazoline-glycerin (CLEAR EYES) 0.012-0.2 % SOLN Place 1 drop into both eyes 4 (four) times daily as needed for irritation.   Yes Historical Provider, MD  prazosin (MINIPRESS) 1 MG capsule Take 3 capsules (3 mg total) by mouth at bedtime. 02/08/15  Yes Adonis Brook, NP  tiZANidine (ZANAFLEX) 2 MG tablet Take 2 mg by mouth 2 (two) times daily as needed for muscle spasms.   Yes  Historical Provider, MD  traZODone (DESYREL) 50 MG tablet Take 50 mg by mouth at bedtime as needed for sleep.   Yes Historical Provider, MD  valACYclovir (VALTREX) 500 MG tablet Take 500 mg by mouth daily as needed (Outbreaks.).   Yes Historical Provider, MD  Vilazodone HCl (VIIBRYD) 40 MG TABS Take 40 mg by mouth daily.   Yes Historical Provider, MD  acamprosate (CAMPRAL) 333 MG tablet Take 2 tablets (666 mg total) by mouth 3 (three) times daily with meals. Patient not taking: Reported on 05/28/2015 02/08/15   Adonis Brook, NP  folic acid (FOLVITE) 1 MG tablet Take 1 tablet (1 mg total) by mouth daily. Patient not  taking: Reported on 05/28/2015 02/19/15   Su Hoff, MD  thiamine 100 MG tablet Take 1 tablet (100 mg total) by mouth daily. Patient not taking: Reported on 05/28/2015 02/19/15   Iris Pert Rivet, MD   BP 117/81 mmHg  Pulse 119  Temp(Src) 98.8 F (37.1 C) (Oral)  Resp 20  Ht  (1.626 m)  Wt 139 lb 5.3 oz (63.2 kg)  BMI 23.90 kg/m2  SpO2 97% Physical Exam  Constitutional: She appears well-developed and well-nourished. No distress.  HENT:  Head: Normocephalic.  Mouth/Throat: Oropharynx is clear and moist.  Patient has a small laceration to the right upper forehead. There appears to be some dried blood. This is likely several days old. No other evidence of injury.  Eyes: EOM are normal. Pupils are equal, round, and reactive to light.  Pupils are 6 mm bilaterally. Horizontal nystagmus present  Neck: Normal range of motion. Neck supple.  No definite midline cervical tenderness. No meningismus.  Cardiovascular: Regular rhythm.   Tachycardia  Pulmonary/Chest: Effort normal and breath sounds normal. No respiratory distress. She has no wheezes. She has no rales. She exhibits no tenderness.  Abdominal: Soft. Bowel sounds are normal. She exhibits no distension and no mass. There is tenderness (Abdomen is diffusely tender. No focality, rebound or guarding). There is no rebound and no guarding.  Musculoskeletal: Normal range of motion. She exhibits no edema or tenderness.  No lower extremity swelling or asymmetry.  Neurological:  Intoxicated. Drowsy and intermittently crying. Follow simple commands. Poor historian. Moving all extremities  Skin: Skin is warm and dry. No rash noted. No erythema.  Nursing note and vitals reviewed.   ED Course  Procedures (including critical care time) Labs Review Labs Reviewed  CBC WITH DIFFERENTIAL/PLATELET - Abnormal; Notable for the following:    RBC 5.17 (*)    Hemoglobin 15.3 (*)    Platelets 436 (*)    All other components within normal limits   COMPREHENSIVE METABOLIC PANEL - Abnormal; Notable for the following:    CO2 21 (*)    Total Protein 9.2 (*)    AST 413 (*)    ALT 436 (*)    Anion gap 20 (*)    All other components within normal limits  URINALYSIS, ROUTINE W REFLEX MICROSCOPIC (NOT AT [facility]) - Abnormal; Notable for the following:    APPearance CLOUDY (*)    Hgb urine dipstick SMALL (*)    Ketones, ur 40 (*)    Protein, ur 100 (*)    All other components within normal limits  ETHANOL - Abnormal; Notable for the following:    Alcohol, Ethyl (B) 484 (*)    All other components within normal limits  URINE RAPID DRUG SCREEN, HOSP PERFORMED - Abnormal; Notable for the following:    Benzodiazepines POSITIVE (*)  All other components within normal limits  ACETAMINOPHEN LEVEL - Abnormal; Notable for the following:    Acetaminophen (Tylenol), Serum <10 (*)    All other components within normal limits  URINE MICROSCOPIC-ADD ON - Abnormal; Notable for the following:    Squamous Epithelial / LPF 0-5 (*)    Bacteria, UA RARE (*)    Casts HYALINE CASTS (*)    All other components within normal limits  MRSA PCR SCREENING  PREGNANCY, URINE  SALICYLATE LEVEL  LIPASE, BLOOD  CBC  COMPREHENSIVE METABOLIC PANEL    Imaging Review Dg Chest 2 View  05/29/2015  CLINICAL DATA:  Larey SeatFell this morning, now with upper right back pain and ecchymosis. EXAM: CHEST  2 VIEW COMPARISON:  06/16/2014 FINDINGS: Right rib images are negative for displaced fracture. There is no bone lesion or bony destruction. PA and lateral views of the chest are negative for pneumothorax, hemothorax or significant pulmonary contusion. The lungs are clear. Hilar and mediastinal contours are normal. Heart size is normal. Pulmonary vasculature is normal. IMPRESSION: No acute findings. Electronically Signed   By: Ellery Plunkaniel R Mitchell M.D.   On: 05/29/2015 23:26   Dg Ribs Unilateral Right  05/29/2015  CLINICAL DATA:  Larey SeatFell this morning, now with upper right back pain and  ecchymosis. EXAM: CHEST  2 VIEW COMPARISON:  06/16/2014 FINDINGS: Right rib images are negative for displaced fracture. There is no bone lesion or bony destruction. PA and lateral views of the chest are negative for pneumothorax, hemothorax or significant pulmonary contusion. The lungs are clear. Hilar and mediastinal contours are normal. Heart size is normal. Pulmonary vasculature is normal. IMPRESSION: No acute findings. Electronically Signed   By: Ellery Plunkaniel R Mitchell M.D.   On: 05/29/2015 23:26   Ct Head Wo Contrast  05/29/2015  CLINICAL DATA:  Head trauma.  Alcohol abuse. EXAM: CT HEAD WITHOUT CONTRAST TECHNIQUE: Contiguous axial images were obtained from the base of the skull through the vertex without intravenous contrast. COMPARISON:  05/16/2015 FINDINGS: There is a minimal amount of residual subcutaneous stranding at the site of the previous identified hematoma/laceration involving the right side of the forehead (image 17, series 2). Regional soft tissues appear otherwise normal. No radiopaque foreign body. No displaced calvarial fracture. Gray-white differentiation is maintained. No CT evidence of acute large territory infarct. No intraparenchymal or extra-axial mass or hemorrhage. Unchanged size and configuration of the ventricles and basilar cisterns. No midline shift. There is underpneumatization the bilateral frontal sinuses. The remaining paranasal sinuses and mastoid air cells are normal. No air-fluid levels. IMPRESSION: 1. Minimal amount of residual subcutaneous stranding at the site of previous identified hematoma/laceration involving the right side of the forehead without definitive superimposed acute traumatic injury to the head. 2. Otherwise, negative noncontrast head CT. Electronically Signed   By: Simonne ComeJohn  Watts M.D.   On: 05/29/2015 17:42   I have personally reviewed and evaluated these images and lab results as part of my medical decision-making.   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 Suspect early alcohol withdrawal. Will start on CIWA protocol. Patient is expressing that she wants help to withdrawal.   Discussed with hospitalist. Will admit to step down bed.  Loren Raceravid Rayona Sardinha, MD 05/30/15 [phone]

## 2015-05-29 NOTE — ED Notes (Signed)
Mardene CelesteJoanna, RN spoke with provider about patients HR and that she is not showing any signs of DT's. Provider agreed and gave verbal order of another liter bolus of ns.

## 2015-05-29 NOTE — ED Notes (Signed)
Patiens family member at bedside, speaking with patient about her ETOH abuse.

## 2015-05-29 NOTE — ED Notes (Signed)
Pt's belongings consist of: white shirt, white sports bra, black/white yoga pants. Removed silver toned jewelry from belly button, bilateral ears, and nose. Unable to remove one ear-ring from left ear. Informed ICU charge nurse. Pt's has been wanded.

## 2015-05-29 NOTE — ED Notes (Addendum)
Pt transported to radiology.

## 2015-05-29 NOTE — ED Notes (Signed)
Pt stated she needed to urinate. Attempted to ambulate patient with two staff members but patient is unable to ambulate with even two staff members. Attempted to help patient a female urinal since she is in the hallway but she states she is unable urinate.

## 2015-05-29 NOTE — ED Notes (Signed)
Pt ate about 50% of her meal tray. Provided some apple juice. Pt resting quietly.

## 2015-05-29 NOTE — ED Notes (Signed)
Pt in CT.

## 2015-05-29 NOTE — ED Notes (Signed)
Pt ambulated with assist.

## 2015-05-30 DIAGNOSIS — F10239 Alcohol dependence with withdrawal, unspecified: Principal | ICD-10-CM

## 2015-05-30 LAB — COMPREHENSIVE METABOLIC PANEL
ALK PHOS: 34 U/L — AB (ref 38–126)
ALT: 338 U/L — ABNORMAL HIGH (ref 14–54)
ANION GAP: 15 (ref 5–15)
AST: 318 U/L — ABNORMAL HIGH (ref 15–41)
Albumin: 3.8 g/dL (ref 3.5–5.0)
BUN: <5 mg/dL — ABNORMAL LOW (ref 6–20)
CALCIUM: 7.6 mg/dL — AB (ref 8.9–10.3)
CO2: 21 mmol/L — AB (ref 22–32)
Chloride: 104 mmol/L (ref 101–111)
Creatinine, Ser: 0.4 mg/dL — ABNORMAL LOW (ref 0.44–1.00)
Glucose, Bld: 83 mg/dL (ref 65–99)
Potassium: 3.7 mmol/L (ref 3.5–5.1)
Sodium: 140 mmol/L (ref 135–145)
Total Bilirubin: 0.7 mg/dL (ref 0.3–1.2)
Total Protein: 7.1 g/dL (ref 6.5–8.1)

## 2015-05-30 LAB — CBC
HCT: 33.2 % — ABNORMAL LOW (ref 36.0–46.0)
HEMOGLOBIN: 11.6 g/dL — AB (ref 12.0–15.0)
MCH: 29.4 pg (ref 26.0–34.0)
MCHC: 34.9 g/dL (ref 30.0–36.0)
MCV: 84.3 fL (ref 78.0–100.0)
PLATELETS: 309 10*3/uL (ref 150–400)
RBC: 3.94 MIL/uL (ref 3.87–5.11)
RDW: 14.3 % (ref 11.5–15.5)
WBC: 5.5 10*3/uL (ref 4.0–10.5)

## 2015-05-30 LAB — MRSA PCR SCREENING: MRSA by PCR: NEGATIVE

## 2015-05-30 MED ORDER — ONDANSETRON 4 MG PO TBDP
4.0000 mg | ORAL_TABLET | Freq: Four times a day (QID) | ORAL | Status: AC | PRN
  Filled 2015-05-30: qty 1

## 2015-05-30 MED ORDER — ADULT MULTIVITAMIN W/MINERALS CH
1.0000 | ORAL_TABLET | Freq: Every day | ORAL | Status: DC
  Administered 2015-05-30 – 2015-06-06 (×8): 1 via ORAL
  Filled 2015-05-30 (×9): qty 1

## 2015-05-30 MED ORDER — CHLORDIAZEPOXIDE HCL 25 MG PO CAPS: 25.0000 mg | ORAL_CAPSULE | Freq: Every day | ORAL | Status: DC

## 2015-05-30 MED ORDER — CHLORDIAZEPOXIDE HCL 25 MG PO CAPS: 25.0000 mg | ORAL_CAPSULE | Freq: Four times a day (QID) | ORAL | Status: DC

## 2015-05-30 MED ORDER — THIAMINE HCL 100 MG/ML IJ SOLN
100.0000 mg | Freq: Once | INTRAMUSCULAR | Status: AC
  Administered 2015-05-30: 100 mg via INTRAVENOUS
  Filled 2015-05-30: qty 2

## 2015-05-30 MED ORDER — LORAZEPAM 2 MG/ML IJ SOLN
INTRAMUSCULAR | Status: AC
  Filled 2015-05-30: qty 1

## 2015-05-30 MED ORDER — CHLORDIAZEPOXIDE HCL 25 MG PO CAPS: 25.0000 mg | ORAL_CAPSULE | ORAL | Status: DC

## 2015-05-30 MED ORDER — LOPERAMIDE HCL 2 MG PO CAPS: 2.0000 mg | ORAL_CAPSULE | ORAL | Status: AC | PRN

## 2015-05-30 MED ORDER — CHLORDIAZEPOXIDE HCL 25 MG PO CAPS
25.0000 mg | ORAL_CAPSULE | Freq: Four times a day (QID) | ORAL | Status: AC
  Administered 2015-05-30 (×4): 25 mg via ORAL
  Filled 2015-05-30 (×4): qty 1

## 2015-05-30 MED ORDER — CHLORDIAZEPOXIDE HCL 25 MG PO CAPS
25.0000 mg | ORAL_CAPSULE | Freq: Three times a day (TID) | ORAL | Status: DC
  Administered 2015-05-31 (×2): 25 mg via ORAL
  Filled 2015-05-30 (×2): qty 1

## 2015-05-30 MED ORDER — VITAMIN B-1 100 MG PO TABS
100.0000 mg | ORAL_TABLET | Freq: Every day | ORAL | Status: DC
  Administered 2015-05-31 – 2015-06-06 (×7): 100 mg via ORAL
  Filled 2015-05-30 (×8): qty 1

## 2015-05-30 MED ORDER — THIAMINE HCL 100 MG/ML IJ SOLN: 100.0000 mg | Freq: Once | INTRAMUSCULAR | Status: DC

## 2015-05-30 MED ORDER — LORAZEPAM 2 MG/ML IJ SOLN
1.0000 mg | Freq: Once | INTRAMUSCULAR | Status: AC
  Administered 2015-05-30: 1 mg via INTRAVENOUS

## 2015-05-30 MED ORDER — HYDROXYZINE HCL 25 MG PO TABS
25.0000 mg | ORAL_TABLET | Freq: Four times a day (QID) | ORAL | Status: AC | PRN
  Administered 2015-05-31 – 2015-06-01 (×6): 25 mg via ORAL
  Filled 2015-05-30 (×6): qty 1

## 2015-05-30 NOTE — Care Management Note (Signed)
Case Management Note  Patient Details  Name: Thomes Lollingrin Dube MRN: [id number] Date of Birth: 03/31/1980  Subjective/Objective:                 etoh intoxication and w/d   Action/Plan: Date:  May 30, 2015 Chart reviewed for concurrent status and case management needs. Will continue to follow patient for changes and needs: Marcelle SmilingRhonda Kelii Chittum, BSN, DrumrightRN3, [place]CCM   [phone]  Expected Discharge Date:  06/02/15               Expected Discharge Plan:  Home/Self Care  In-House Referral:  Clinical Social Work  Discharge planning Services  CM Consult  Post Acute Care Choice:    Choice offered to:     DME Arranged:    DME Agency:     HH Arranged:    HH Agency:     Status of Service:  Completed, signed off  Medicare Important Message Given:    Date Medicare IM Given:    Medicare IM give by:    Date Additional Medicare IM Given:    Additional Medicare Important Message give by:     If discussed at Long Length of Stay Meetings, dates discussed:    Additional Comments:  Golda AcreDavis, Tajae Maiolo Lynn, RN 05/30/2015, 10:52 AM
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
## 2015-05-30 NOTE — Progress Notes (Signed)
PROGRESS NOTE    [name] Stewart  WUJ:[id number] DOB: 11/09/1980 DOA: 05/29/2015 PCP: Verlon AuBoyd, Tammy Lamonica, MD  Outpatient Specialists:     Brief Narrative:  35 year old female Chronic ethanol dependence daily drinker PTSD Bipolar Prior multiple detox episodes Admitted to [facility] step down 05/29/2015 after being found down obtunded tachycardic    Assessment & Plan:   Principal Problem:   Alcohol withdrawal (HCC) Active Problems:   GERD (gastroesophageal reflux disease)   Alcohol dependence (HCC)   Depression   Alcohol withdrawal (HCC) Active Problems:  GERD (gastroesophageal reflux disease)  Alcohol dependence (HCC)  Depression  Admit to inpatient, stepdown, with sitter for suicide precautions  Acute EtOH withdrawal --CIWA protocol, ranging between 9 and 17 =-Because of her elevated score, I transitioned her from Ativan which is short-acting to long-acting Librium and hopeful that we can avoid intubation and sedation -Critical care medicine on standby in case Precedex as needed --Multivitamin, folate, thiamine daily --NS at 125cc/hr --Seizure precautions  Alcoholic hepatitis Follow LFT trend  Repeat INR and LFTs in a.m.   Back pain S/P fall --Chest/rib xrays pending --Analgesics as needed  Extensive psych history with Bipolar disorder, PTSD, depression --Psych consult pending and appreciated --Will continue Buspar 10 mg 3 times a day, neurontin 100 3 times a day, lamictal 25 daily, and Latuda 40 daily for now; otherwise, await psych recommendations. - She will definitely need inpatient transfer when medically stable.  Hypertension -Continue prazosin 3 mg daily   Psychiatry   No family at bedside Keep on step down status-very high risk and may need ICU level care in case Precedex as started Expect disposition to inpatient psychiatric unit probably in 3-4 days  Subjective:  Visibly tremulous at bedside No overt pain nausea vomiting No other  issues at present other than headache and anxiety  Objective: Filed Vitals:   05/30/15 0853 05/30/15 1000 05/30/15 1200 05/30/15 1206  BP: 137/87 122/79 129/76 129/76  Pulse: 101  105 88  Temp:    98.6 F (37 C)  TempSrc:    Oral  Resp:  18  23  Height:      Weight:      SpO2:  94%  97%    Intake/Output Summary (Last 24 hours) at 05/30/15 1437 Last data filed at 05/30/15 1326  Gross per 24 hour  Intake 1463.42 ml  Output   1800 ml  Net -336.58 ml   Filed Weights   05/29/15 2335  Weight: 63.2 kg (139 lb 5.3 oz)    Examination:  Flat affect, somewhat anxious No icterus no pallor Neck soft supple no JVD S1-S2 tachycardic no murmur Abdomen soft nontender nondistended no rebound no guarding-cannot appreciate hepatomegaly No lower extremity edema somewhat guarded Able to move all 4 limbs equally.     Data Reviewed: I have personally reviewed following labs and imaging studies  CBC:  Recent Labs Lab 05/28/15 1409 05/29/15 1658 05/30/15 0042  WBC 6.6 7.0 5.5  NEUTROABS  --  2.8  --   HGB 15.1* 15.3* 11.6*  HCT 44.1 44.6 33.2*  MCV 86.5 86.3 84.3  PLT 395 436* 309   Basic Metabolic Panel:  Recent Labs Lab 05/28/15 1409 05/29/15 1658 05/30/15 0042  NA 141 145 140  K 4.0 4.1 3.7  CL 101 104 104  CO2 15* 21* 21*  GLUCOSE 75 88 83  BUN 14 6 <5*  CREATININE 0.65 0.61 0.40*  CALCIUM 9.0 8.9 7.6*   GFR: Estimated Creatinine Clearance: 84.8 mL/min (by C-G  formula based on Cr of 0.4). Liver Function Tests:  Recent Labs Lab 05/28/15 1409 05/29/15 1658 05/30/15 0042  AST 295* 413* 318*  ALT 412* 436* 338*  ALKPHOS 43 46 34*  BILITOT 0.3 0.7 0.7  PROT 8.7* 9.2* 7.1  ALBUMIN 4.7 4.9 3.8    Recent Labs Lab 05/29/15 1658  LIPASE 40   No results for input(s): AMMONIA in the last 168 hours. Coagulation Profile: No results for input(s): INR, PROTIME in the last 168 hours. Cardiac Enzymes: No results for input(s): CKTOTAL, CKMB, CKMBINDEX,  TROPONINI in the last 168 hours. BNP (last 3 results) No results for input(s): PROBNP in the last 8760 hours. HbA1C: No results for input(s): HGBA1C in the last 72 hours. CBG: No results for input(s): GLUCAP in the last 168 hours. Lipid Profile: No results for input(s): CHOL, HDL, LDLCALC, TRIG, CHOLHDL, LDLDIRECT in the last 72 hours. Thyroid Function Tests: No results for input(s): TSH, T4TOTAL, FREET4, T3FREE, THYROIDAB in the last 72 hours. Anemia Panel: No results for input(s): VITAMINB12, FOLATE, FERRITIN, TIBC, IRON, RETICCTPCT in the last 72 hours. Urine analysis:    Component Value Date/Time   COLORURINE YELLOW 05/29/2015 1932   APPEARANCEUR CLOUDY* 05/29/2015 1932   LABSPEC 1.013 05/29/2015 1932   PHURINE 5.0 05/29/2015 1932   GLUCOSEU NEGATIVE 05/29/2015 1932   HGBUR SMALL* 05/29/2015 1932   BILIRUBINUR NEGATIVE 05/29/2015 1932   KETONESUR 40* 05/29/2015 1932   PROTEINUR 100* 05/29/2015 1932   UROBILINOGEN 0.2 12/31/2013 2136   NITRITE NEGATIVE 05/29/2015 1932   LEUKOCYTESUR NEGATIVE 05/29/2015 1932   Sepsis Labs: @LABRCNTIP (procalcitonin:4,lacticidven:4)  ) Recent Results (from the past 240 hour(s))  MRSA PCR Screening     Status: None   Collection Time: 05/29/15 11:59 PM  Result Value Ref Range Status   MRSA by PCR NEGATIVE NEGATIVE Final    Comment:        The GeneXpert MRSA Assay (FDA approved for NASAL specimens only), is one component of a comprehensive MRSA colonization surveillance program. It is not intended to diagnose MRSA infection nor to guide or monitor treatment for MRSA infections.          Radiology Studies: Dg Chest 2 View  05/29/2015  CLINICAL DATA:  Larey Seat this morning, now with upper right back pain and ecchymosis. EXAM: CHEST  2 VIEW COMPARISON:  06/16/2014 FINDINGS: Right rib images are negative for displaced fracture. There is no bone lesion or bony destruction. PA and lateral views of the chest are negative for pneumothorax,  hemothorax or significant pulmonary contusion. The lungs are clear. Hilar and mediastinal contours are normal. Heart size is normal. Pulmonary vasculature is normal. IMPRESSION: No acute findings. Electronically Signed   By: Ellery Plunk M.D.   On: 05/29/2015 23:26   Dg Ribs Unilateral Right  05/29/2015  CLINICAL DATA:  Larey Seat this morning, now with upper right back pain and ecchymosis. EXAM: CHEST  2 VIEW COMPARISON:  06/16/2014 FINDINGS: Right rib images are negative for displaced fracture. There is no bone lesion or bony destruction. PA and lateral views of the chest are negative for pneumothorax, hemothorax or significant pulmonary contusion. The lungs are clear. Hilar and mediastinal contours are normal. Heart size is normal. Pulmonary vasculature is normal. IMPRESSION: No acute findings. Electronically Signed   By: Ellery Plunk M.D.   On: 05/29/2015 23:26   Ct Head Wo Contrast  05/29/2015  CLINICAL DATA:  Head trauma.  Alcohol abuse. EXAM: CT HEAD WITHOUT CONTRAST TECHNIQUE: Contiguous axial images were obtained from  the base of the skull through the vertex without intravenous contrast. COMPARISON:  05/16/2015 FINDINGS: There is a minimal amount of residual subcutaneous stranding at the site of the previous identified hematoma/laceration involving the right side of the forehead (image 17, series 2). Regional soft tissues appear otherwise normal. No radiopaque foreign body. No displaced calvarial fracture. Gray-white differentiation is maintained. No CT evidence of acute large territory infarct. No intraparenchymal or extra-axial mass or hemorrhage. Unchanged size and configuration of the ventricles and basilar cisterns. No midline shift. There is underpneumatization the bilateral frontal sinuses. The remaining paranasal sinuses and mastoid air cells are normal. No air-fluid levels. IMPRESSION: 1. Minimal amount of residual subcutaneous stranding at the site of previous identified  hematoma/laceration involving the right side of the forehead without definitive superimposed acute traumatic injury to the head. 2. Otherwise, negative noncontrast head CT. Electronically Signed   By: Simonne Come M.D.   On: 05/29/2015 17:42        Scheduled Meds: . busPIRone  10 mg Oral TID  . chlordiazePOXIDE  25 mg Oral QID   Followed by  . [START ON 05/31/2015] chlordiazePOXIDE  25 mg Oral TID   Followed by  . [START ON 06/01/2015] chlordiazePOXIDE  25 mg Oral BH-qamhs   Followed by  . [START ON 06/02/2015] chlordiazePOXIDE  25 mg Oral Daily  . folic acid  1 mg Oral Daily  . gabapentin  100 mg Oral TID  . lamoTRIgine  50 mg Oral q morning - 10a  . lurasidone  80 mg Oral QHS  . multivitamin with minerals  1 tablet Oral Daily  . pantoprazole  40 mg Oral Daily  . prazosin  3 mg Oral QHS  . sodium chloride flush  3 mL Intravenous Q12H  . [START ON 05/31/2015] thiamine  100 mg Oral Daily   Continuous Infusions: . sodium chloride 125 mL/hr at 05/30/15 0733     LOS: 1 day    Time spent: 69    Pleas Koch, MD [facility] ([facility]   If 7PM-7AM, please contact night-coverage [email or web address] Password [facility] 05/30/2015, 2:37 PM

## 2015-05-31 MED ORDER — CHLORDIAZEPOXIDE HCL 25 MG PO CAPS
25.0000 mg | ORAL_CAPSULE | ORAL | Status: DC
  Administered 2015-05-31 – 2015-06-01 (×3): 25 mg via ORAL
  Filled 2015-05-31 (×3): qty 1
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
## 2015-05-31 NOTE — Progress Notes (Signed)
LCSW following for disposition and residential treatment for alcohol abuse.  Patient has been accepted to [facility] in [place] Her insurance has been authorized and she has 100% coverage. Patient is agreeable to residential treatment, however she is adamant about going home first before going into substance abuse treatment. Explained to her that if she goes home and does not do acute to acute transfer she may lose her insurance authorization as she is not fulfilling the medical necessity qualification. Patient reports she has to go home to pay her bills, pack herself, and prepare before going into residential treatment. She is willing to do phone interview with admissions counselor with [facility].  LCSW offered to bring laptop into room, sign on and help with paying bills.  Patient declined reporting she does not know her password and everything is saved on to computer. LCSW offered to call roommate or female friend who has come by to see if they could obtain her laptop and information and she declined. Offered to assist with calling back to pay bills and she reports she does not want to do that.   Patient is very resistant to going to facility from hospital. She is not willing to go unless she can go home. LCSW offered to speak with patient's parents in effort to involve them and help with paying bills or doing necessary things, but she refused.  Unclear what she will do, but if she goes home she has such a high risk of relapse.  She reports "if I have to drink myself back into facility then I will do it and argue with my insurance company to get in."  LCSW called psych MD to just to review current situation and disposition.  CSW continues to follow.  Deretha EmoryHannah Ladavia Lindenbaum LCSW, MSW Clinical Social Work: System [employer] [phone]
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
## 2015-05-31 NOTE — Progress Notes (Signed)
Chaplain following in response to spiritual care consult.  Familiar with pt from admission to [facility] in January of this year.    Denny Peonrin was sleeping when this chaplain visited.  Will return to assess for support needs around ETOH, readmission, recovery.   Belva CromeStalnaker, Shareena Nusz Wayne MDiv
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
## 2015-06-01 ENCOUNTER — Inpatient Hospital Stay (HOSPITAL_COMMUNITY): Payer: BLUE CROSS/BLUE SHIELD

## 2015-06-01 LAB — COMPREHENSIVE METABOLIC PANEL
ALBUMIN: 3.7 g/dL (ref 3.5–5.0)
ALK PHOS: 51 U/L (ref 38–126)
ALT: 378 U/L — AB (ref 14–54)
AST: 371 U/L — AB (ref 15–41)
Anion gap: 8 (ref 5–15)
BILIRUBIN TOTAL: 0.5 mg/dL (ref 0.3–1.2)
BUN: 9 mg/dL (ref 6–20)
CALCIUM: 8.9 mg/dL (ref 8.9–10.3)
CO2: 25 mmol/L (ref 22–32)
CREATININE: 0.51 mg/dL (ref 0.44–1.00)
Chloride: 107 mmol/L (ref 101–111)
GFR calc Af Amer: >60 mL/min (ref >60)
GLUCOSE: 100 mg/dL — AB (ref 65–99)
Potassium: 3.7 mmol/L (ref 3.5–5.1)
Sodium: 140 mmol/L (ref 135–145)
Total Protein: 6.8 g/dL (ref 6.5–8.1)

## 2015-06-01 MED ORDER — CHLORDIAZEPOXIDE HCL 25 MG PO CAPS
25.0000 mg | ORAL_CAPSULE | Freq: Four times a day (QID) | ORAL | Status: DC
  Administered 2015-06-01 (×4): 25 mg via ORAL
  Filled 2015-06-01 (×5): qty 1

## 2015-06-01 NOTE — Progress Notes (Signed)
[facility] PROGRESS NOTE  [name] Stewart ZOX:[id number] DOB: 18-Mar-1980 DOA: 05/29/2015  PCP: Verlon Au, MD  Brief HPI: 35 year old Caucasian female with a past medical history of alcoholism, bipolar disorder, presented with signs and symptoms suggestive of alcohol withdrawal. She was hospitalized for further management.  Past medical history:  Past Medical History  Diagnosis Date  . Alcoholic (HCC)   . Anxiety   . Post traumatic stress disorder (PTSD)   . Alcohol related seizure (HCC)   . Bipolar disorder (HCC)   . Tension headache, chronic     "monthly" (01/31/2015)  . Depression     Consultants: Psychiatry  Procedures: None  Antibiotics: None  Subjective: Patient feels better this morning. Not as tremulous. Denies any nausea, vomiting. No dizziness or lightheadedness.  Objective:  Vital Signs  Filed Vitals:   06/01/15 0600 06/01/15 0800 06/01/15 0900 06/01/15 1000  BP:  132/82    Pulse:      Temp:  97.5 F (36.4 C)    TempSrc:  Oral    Resp: Height:      Weight:      SpO2: 95% 97% 99% 96%    Intake/Output Summary (Last 24 hours) at 06/01/15 1116 Last data filed at 06/01/15 0600  Gross per 24 hour  Intake   1845 ml  Output   3500 ml  Net  -1655 ml   Filed Weights   05/29/15 2335  Weight: 63.2 kg (139 lb 5.3 oz)    General appearance: alert, cooperative, appears stated age and no distress Resp: Diminished air entry at the bases. No crackles or wheezing. Cardio: regular rate and rhythm, S1, S2 normal, no murmur, click, rub or gallop GI: soft, non-tender; bowel sounds normal; no masses,  no organomegaly Extremities: No bruising noted over the right shoulder. Good range of motion. Examination of the back does not reveal any bruises. Tenderness over thoracic and lumbar spine without any specific area of abnormality. Neurologic: Awake and alert. Oriented 3. No tremors. No focal neurological deficits.  Lab Results:  Data  Reviewed: I have personally reviewed following labs and imaging studies  CBC:  Recent Labs Lab 05/28/15 1409 05/29/15 1658 05/30/15 0042  WBC 6.6 7.0 5.5  NEUTROABS  --  2.8  --   HGB 15.1* 15.3* 11.6*  HCT 44.1 44.6 33.2*  MCV 86.5 86.3 84.3  PLT 395 436* 309   Basic Metabolic Panel:  Recent Labs Lab 05/28/15 1409 05/29/15 1658 05/30/15 0042 06/01/15 0305  NA 141 145 140 140  K 4.0 4.1 3.7 3.7  CL 101 104 104 107  CO2 15* 21* 21* 25  GLUCOSE 75 88 83 100*  BUN 14 6 <5* 9  CREATININE 0.65 0.61 0.40* 0.51  CALCIUM 9.0 8.9 7.6* 8.9   GFR: Estimated Creatinine Clearance: 84.8 mL/min (by C-G formula based on Cr of 0.51). Liver Function Tests:  Recent Labs Lab 05/28/15 1409 05/29/15 1658 05/30/15 0042 06/01/15 0305  AST 295* 413* 318* 371*  ALT 412* 436* 338* 378*  ALKPHOS 43 46 34* 51  BILITOT 0.3 0.7 0.7 0.5  PROT 8.7* 9.2* 7.1 6.8  ALBUMIN 4.7 4.9 3.8 3.7    Recent Labs Lab 05/29/15 1658  LIPASE 40   Urine analysis:    Component Value Date/Time   COLORURINE YELLOW 05/29/2015 1932   APPEARANCEUR CLOUDY* 05/29/2015 1932   LABSPEC 1.013 05/29/2015 1932   PHURINE 5.0 05/29/2015 1932   GLUCOSEU NEGATIVE 05/29/2015 1932   HGBUR  SMALL* 05/29/2015 1932   BILIRUBINUR NEGATIVE 05/29/2015 1932   KETONESUR 40* 05/29/2015 1932   PROTEINUR 100* 05/29/2015 1932   UROBILINOGEN 0.2 12/31/2013 2136   NITRITE NEGATIVE 05/29/2015 1932   LEUKOCYTESUR NEGATIVE 05/29/2015 1932    Recent Results (from the past 240 hour(s))  MRSA PCR Screening     Status: None   Collection Time: 05/29/15 11:59 PM  Result Value Ref Range Status   MRSA by PCR NEGATIVE NEGATIVE Final    Comment:        The GeneXpert MRSA Assay (FDA approved for NASAL specimens only), is one component of a comprehensive MRSA colonization surveillance program. It is not intended to diagnose MRSA infection nor to guide or monitor treatment for MRSA infections.       Radiology Studies: Dg  Thoracic Spine 2 View  06/01/2015  CLINICAL DATA:  Mid back pain.  Unwitnessed fall. EXAM: THORACIC SPINE 2 VIEWS COMPARISON:  05/29/2015 FINDINGS: No vertebral compression deformity.  Anatomic alignment. IMPRESSION: No acute bony pathology. Electronically Signed   By: Jolaine ClickArthur  Hoss M.D.   On: 06/01/2015 10:02   Dg Lumbar Spine 2-3 Views  06/01/2015  CLINICAL DATA:  Back pain . EXAM: LUMBAR SPINE - 2-3 VIEW COMPARISON:  No recent prior . FINDINGS: Multilevel mild degenerative change. No acute bony abnormality identified. Normal Mild scoliosis concave right. Normal mineralization. IMPRESSION: Mild scoliosis concave right.  No acute abnormality . Electronically Signed   By: Maisie Fushomas  Register   On: 06/01/2015 10:04     Medications:  Scheduled: . busPIRone  10 mg Oral TID  . chlordiazePOXIDE  25 mg Oral QID  . folic acid  1 mg Oral Daily  . gabapentin  100 mg Oral TID  . lamoTRIgine  50 mg Oral q morning - 10a  . lurasidone  80 mg Oral QHS  . multivitamin with minerals  1 tablet Oral Daily  . pantoprazole  40 mg Oral Daily  . prazosin  3 mg Oral QHS  . sodium chloride flush  3 mL Intravenous Q12H  . thiamine  100 mg Oral Daily   Continuous: . sodium chloride 50 mL/hr at 05/31/15 16100812   RUE:[id number]:acetaminophen **OR** acetaminophen, hydrOXYzine, loperamide, naphazoline, ondansetron **OR** ondansetron (ZOFRAN) IV, ondansetron, oxyCODONE, tiZANidine  Assessment/Plan:  Principal Problem:   Alcohol withdrawal (HCC) Active Problems:   GERD (gastroesophageal reflux disease)   Alcohol dependence (HCC)   Depression    Acute EtOH withdrawal Patient seems to be improving slowly. Cutback on the dose of Librium as she appears to be somewhat sedated. Continue multivitamins, folate and thiamine. Patient has been seen by Child psychotherapistsocial worker. Acute rehabilitation for alcoholism, has been offered. However, patient would like to go home first. Start mobilizing. Okay for transfer to floor today.  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   Back pain S/P fall Rib films and chest x-ray did not show any acute changes. X-ray of the thoracic and lumbar spine ordered without any acute findings either. She does not have any neurological deficits. Pain control.   Extensive psych history with Bipolar disorder, PTSD, depression Patient seen by psychiatry. She is on multiple psychotropic medications. Sitter has been discontinued. Psychiatry recommends outpatient detox therapies and is attempting to facilitate this. However, patient wishes to go home first.  History of essential Hypertension Blood pressure is stable. Continue prazosin 3 mg daily   DVT Prophylaxis: SCD's    Code Status: Full code  Family Communication: Discussed with the patient  Disposition Plan: Start mobilizing. Okay for  transfer to floor.    LOS: 3 days   [facility]  [facility] Pager [phone] 06/01/2015, 11:16 AM  If 7PM-7AM, please contact night-coverage at [email or web address], password [facility]

## 2015-06-01 NOTE — Progress Notes (Signed)
[facility] Patient's belongings and floor medications were transported with the patient. No changes since am assessment.

## 2015-06-01 NOTE — Clinical Social Work Psych Note (Addendum)
Clinical Social Worker Psych Service Line Progress Note  Clinical Social Worker: Cordella RegisterCoble, Asa Fath N, LCSW Date/Time: 06/01/2015, 2:34 PM   Review of Patient  Overall Medical Condition:  Patient has moved out of ICU and up to medical unit 5East. She reports her withdrawal has improved as she is not having tremors, headache decreased, and less frequent hot flashes/sweating.  Patient reports she continues to feel very tired and sore from the fall prior to admission.  Participation Level:  Active Participation Quality: Resistant Other Participation Quality:  Patient continues to be resistant regarding [facility] with regards to going straight from hospital to residential facility.  Patient reports she must go home to say good bye to her cats, pack her own bags, and pay her bills.  Patient not agreeable to recommendation and safety concerns about her chronic alcohol abuse.   Affect: Anxious, Incongruent Cognitive: Alert, Appropriate, Oriented Reaction to Medications/Concerns:  No reactions to medications at this time.  Patient has been restarted on her psychiatric medications  Modes of Intervention: Exploration, Education, Clarification, Solution-focused   Summary of Progress/Plan at Discharge  Summary of Progress/Plan at Discharge:  LCSW completed IVC paperwork, discussed case with MD and Psychiatric MD and both in agreement regarding her clincial status, blood alcohol on admission, 5 admission in last 6 months, and safety concerns. Paperwork faxed to [employer].  LCSW updated [facility] and [employer] RN on [employer]. Papers to be serviced.  Patient is aware of IVC and reports she is willing to let her friend pack her clothing and pay her bills here in hospital. LCSW explained that IVC has already been completed and paperwork is in route to be served.  Disposition will be [facility]. Patient unhappy and frustrated, but due to adament refusal to allow LCSW to speak with friends,  family, complete billing, and plan for [facility], [facility] was referred and IVC in place for safety.   Referral faxed to [facility] on 05/31/15.  Currently under review.  Will have weekend follow up on referral.  (no weekend admission for [facility]) LCSW sent additional clinicals showing improvement and move to 5East. Awaiting papers to be served. IVC (original paperwork on patient chart) Patient refusing to allow mother or friends to be called as she claims she will not be alone.  Patient inconsistent and resistant.   Deretha EmoryHannah Wenona [place] LCSW, MSW Clinical Social Work: [employer] [phone]

## 2015-06-02 DIAGNOSIS — K7010 Alcoholic hepatitis without ascites: Secondary | ICD-10-CM

## 2015-06-02 MED ORDER — POLYVINYL ALCOHOL 1.4 % OP SOLN
1.0000 [drp] | OPHTHALMIC | Status: DC | PRN
  Administered 2015-06-02 – 2015-06-04 (×3): 1 [drp] via OPHTHALMIC
  Filled 2015-06-02: qty 15

## 2015-06-02 MED ORDER — CHLORDIAZEPOXIDE HCL 25 MG PO CAPS
25.0000 mg | ORAL_CAPSULE | Freq: Three times a day (TID) | ORAL | Status: DC
  Administered 2015-06-02 – 2015-06-03 (×4): 25 mg via ORAL
  Filled 2015-06-02 (×3): qty 1

## 2015-06-02 NOTE — Evaluation (Signed)
Physical Therapy Evaluation-1x Patient Details Name: [name] Stewart MRN: [id number] DOB: 01/27/1981 Today's Date: 06/02/2015   History of Present Illness  35 yo female admitted with ETOH withdrawal, fall. Hx of ETOH abuse, bipolar d/o, ETOH related Sz, PTSD, anxiety, depression  Clinical Impression  On eval, pt was supervision-Mod Ind for mobility-walked ~300 feet. No LOB. Slow gait speed. Pt c/o not resting well/feeling groggy. Recommended ice to sore R UE and for pt to use UE as tolerated. No further acute PT needs. Pt can ambulate in hallway with nursing supervision.     Follow Up Recommendations No PT follow up    Equipment Recommendations  None recommended by PT    Recommendations for Other Services       Precautions / Restrictions Precautions Precautions: Fall Restrictions Weight Bearing Restrictions: No      Mobility  Bed Mobility Overal bed mobility: Modified Independent                Transfers Overall transfer level: Modified independent                  Ambulation/Gait Ambulation/Gait assistance: Supervision Ambulation Distance (Feet): 300 Feet Assistive device: None Gait Pattern/deviations: Step-through pattern     General Gait Details: slow gait speed  Stairs            Wheelchair Mobility    Modified Rankin (Stroke Patients Only)       Balance                                             Pertinent Vitals/Pain Pain Assessment: 0-10 Pain Score: 5  Pain Location: R shoulder Pain Descriptors / Indicators: Sore;Tender Pain Intervention(s): Monitored during session;Repositioned    Home Living Family/patient expects to be discharged to:: Private residence                      Prior Function Level of Independence: Independent               Hand Dominance        Extremity/Trunk Assessment   Upper Extremity Assessment: RUE deficits/detail RUE Deficits / Details: TTP R shoulder blade. Pain with  AROM/AAROM up to 90 degrees.                    Communication   Communication: No difficulties  Cognition Arousal/Alertness: Awake/alert Behavior During Therapy: WFL for tasks assessed/performed Overall Cognitive Status: Within Functional Limits for tasks assessed                      General Comments      Exercises        Assessment/Plan    PT Assessment Patent does not need any further PT services (Recommend daily ambulation with nursing supervision)  PT Diagnosis     PT Problem List    PT Treatment Interventions     PT Goals (Current goals can be found in the Care Plan section) Acute Rehab PT Goals Patient Stated Goal: alcohol rehab PT Goal Formulation: All assessment and education complete, DC therapy    Frequency     Barriers to discharge        Co-evaluation               End of Session Equipment Utilized During Treatment: Gait belt Activity Tolerance: Patient tolerated  treatment well Patient left: in bed;with call bell/phone within reach;with nursing/sitter in room           Time: 1610-9604 PT Time Calculation (min) (ACUTE ONLY): 14 min   Charges:   PT Evaluation $PT Eval Low Complexity: 1 Procedure     PT G Codes:        Rebeca Alert, MPT Pager: [phone]

## 2015-06-02 NOTE — Progress Notes (Signed)
[facility] PROGRESS NOTE  [name] Stewart ZOX:[id number] DOB: 1980/08/09 DOA: 05/29/2015  PCP: Verlon Au, MD  Brief HPI: 35 year old Caucasian female with a past medical history of alcoholism, bipolar disorder, presented with signs and symptoms suggestive of alcohol withdrawal. She was hospitalized for further management.  Past medical history:  Past Medical History  Diagnosis Date  . Alcoholic (HCC)   . Anxiety   . Post traumatic stress disorder (PTSD)   . Alcohol related seizure (HCC)   . Bipolar disorder (HCC)   . Tension headache, chronic     "monthly" (01/31/2015)  . Depression     Consultants: Psychiatry  Procedures: None  Antibiotics: None  Subjective: Patient feels sleepy. Denies any nausea, vomiting. No dizziness or lightheadedness. Overall feels better.   Objective:  Vital Signs  Filed Vitals:   06/01/15 1100 06/01/15 1300 06/01/15 2148 06/02/15 0528  BP:  103/68 116/84 95/65  Pulse:  81 84 82  Temp:   97.7 F (36.5 C) 98.3 F (36.8 C)  TempSrc:   Oral Oral  Resp: 13 16 15 15   Height:      Weight:      SpO2: 96% 97% 99% 97%    Intake/Output Summary (Last 24 hours) at 06/02/15 0929 Last data filed at 06/02/15 0749  Gross per 24 hour  Intake   2070 ml  Output    500 ml  Net   1570 ml   Filed Weights   05/29/15 2335  Weight: 63.2 kg (139 lb 5.3 oz)    General appearance: alert, cooperative, appears stated age and no distress Resp: Diminished air entry at the bases. No crackles or wheezing. Cardio: regular rate and rhythm, S1, S2 normal, no murmur, click, rub or gallop GI: soft, non-tender; bowel sounds normal; no masses,  no organomegaly Extremities: No bruising noted over the right shoulder. Good range of motion. Examination of the back does not reveal any bruises. Tenderness over thoracic and lumbar spine without any specific area of abnormality. Neurologic: Awake and alert. Oriented 3. No focal neurological deficits.  Lab  Results:  Data Reviewed: I have personally reviewed following labs and imaging studies  CBC:  Recent Labs Lab 05/28/15 1409 05/29/15 1658 05/30/15 0042  WBC 6.6 7.0 5.5  NEUTROABS  --  2.8  --   HGB 15.1* 15.3* 11.6*  HCT 44.1 44.6 33.2*  MCV 86.5 86.3 84.3  PLT 395 436* 309   Basic Metabolic Panel:  Recent Labs Lab 05/28/15 1409 05/29/15 1658 05/30/15 0042 06/01/15 0305  NA 141 145 140 140  K 4.0 4.1 3.7 3.7  CL 101 104 104 107  CO2 15* 21* 21* 25  GLUCOSE 75 88 83 100*  BUN 14 6 <5* 9  CREATININE 0.65 0.61 0.40* 0.51  CALCIUM 9.0 8.9 7.6* 8.9   GFR: Estimated Creatinine Clearance: 84.8 mL/min (by C-G formula based on Cr of 0.51).  Liver Function Tests:  Recent Labs Lab 05/28/15 1409 05/29/15 1658 05/30/15 0042 06/01/15 0305  AST 295* 413* 318* 371*  ALT 412* 436* 338* 378*  ALKPHOS 43 46 34* 51  BILITOT 0.3 0.7 0.7 0.5  PROT 8.7* 9.2* 7.1 6.8  ALBUMIN 4.7 4.9 3.8 3.7    Recent Labs Lab 05/29/15 1658  LIPASE 40   Urine analysis:    Component Value Date/Time   COLORURINE YELLOW 05/29/2015 1932   APPEARANCEUR CLOUDY* 05/29/2015 1932   LABSPEC 1.013 05/29/2015 1932   PHURINE 5.0 05/29/2015 1932   GLUCOSEU NEGATIVE 05/29/2015 1932  HGBUR SMALL* 05/29/2015 1932   BILIRUBINUR NEGATIVE 05/29/2015 1932   KETONESUR 40* 05/29/2015 1932   PROTEINUR 100* 05/29/2015 1932   UROBILINOGEN 0.2 12/31/2013 2136   NITRITE NEGATIVE 05/29/2015 1932   LEUKOCYTESUR NEGATIVE 05/29/2015 1932    Recent Results (from the past 240 hour(s))  MRSA PCR Screening     Status: None   Collection Time: 05/29/15 11:59 PM  Result Value Ref Range Status   MRSA by PCR NEGATIVE NEGATIVE Final    Comment:        The GeneXpert MRSA Assay (FDA approved for NASAL specimens only), is one component of a comprehensive MRSA colonization surveillance program. It is not intended to diagnose MRSA infection nor to guide or monitor treatment for MRSA infections.        Radiology Studies: Dg Thoracic Spine 2 View  06/01/2015  CLINICAL DATA:  Mid back pain.  Unwitnessed fall. EXAM: THORACIC SPINE 2 VIEWS COMPARISON:  05/29/2015 FINDINGS: No vertebral compression deformity.  Anatomic alignment. IMPRESSION: No acute bony pathology. Electronically Signed   By: Jolaine ClickArthur  Hoss M.D.   On: 06/01/2015 10:02   Dg Lumbar Spine 2-3 Views  06/01/2015  CLINICAL DATA:  Back pain . EXAM: LUMBAR SPINE - 2-3 VIEW COMPARISON:  No recent prior . FINDINGS: Multilevel mild degenerative change. No acute bony abnormality identified. Normal Mild scoliosis concave right. Normal mineralization. IMPRESSION: Mild scoliosis concave right.  No acute abnormality . Electronically Signed   By: Maisie Fushomas  Register   On: 06/01/2015 10:04     Medications:  Scheduled: . busPIRone  10 mg Oral TID  . chlordiazePOXIDE  25 mg Oral TID  . folic acid  1 mg Oral Daily  . gabapentin  100 mg Oral TID  . lamoTRIgine  50 mg Oral q morning - 10a  . lurasidone  80 mg Oral QHS  . multivitamin with minerals  1 tablet Oral Daily  . pantoprazole  40 mg Oral Daily  . prazosin  3 mg Oral QHS  . sodium chloride flush  3 mL Intravenous Q12H  . thiamine  100 mg Oral Daily   Continuous: . sodium chloride 50 mL/hr at 06/01/15 1751   ZOX:[id number]:acetaminophen **OR** acetaminophen, naphazoline, ondansetron **OR** ondansetron (ZOFRAN) IV, oxyCODONE, polyvinyl alcohol, tiZANidine  Assessment/Plan:  Principal Problem:   Alcohol withdrawal (HCC) Active Problems:   GERD (gastroesophageal reflux disease)   Alcohol dependence (HCC)   Depression    Acute EtOH withdrawal Patient is much improved. Continue to cut back on dose of Librium. Continue multivitamins, folate and thiamine. Patient has been seen by Child psychotherapistsocial worker. Acute rehabilitation for alcoholism, has been offered. However, patient wanted to go home first. But, per psychiatry, patient will remain at high risk for relapse if she is allowed to go home. They  recommended involuntarily committing her. Plan is for her to go to an inpatient rehabilitation facility on Monday. Start mobilizing the patient in the hospital.   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 Will need outpatient follow-up for same.  Back pain S/P fall Rib films and chest x-ray did not show any acute changes. X-ray of the thoracic and lumbar spine without any acute findings either. She does not have any neurological deficits. Pain control.   Extensive psych history with Bipolar disorder, PTSD, depression Patient seen by psychiatry. She is on multiple psychotropic medications. Management per psychiatry.  History of essential Hypertension Blood pressure is stable. Continue prazosin 3 mg daily   DVT Prophylaxis: SCD's    Code Status: Full code  Family Communication: Discussed with the patient  Disposition Plan: Start mobilizing.     LOS: 4 days   [facility]  [facility] Pager [phone] 06/02/2015, 9:29 AM  If 7PM-7AM, please contact night-coverage at [email or web address], password [facility]

## 2015-06-03 DIAGNOSIS — F10230 Alcohol dependence with withdrawal, uncomplicated: Secondary | ICD-10-CM

## 2015-06-03 LAB — BASIC METABOLIC PANEL
ANION GAP: 9 (ref 5–15)
BUN: 19 mg/dL (ref 6–20)
CHLORIDE: 105 mmol/L (ref 101–111)
CO2: 26 mmol/L (ref 22–32)
Calcium: 9.5 mg/dL (ref 8.9–10.3)
Creatinine, Ser: 0.67 mg/dL (ref 0.44–1.00)
GFR calc non Af Amer: >60 mL/min (ref >60)
Glucose, Bld: 103 mg/dL — ABNORMAL HIGH (ref 65–99)
POTASSIUM: 3.9 mmol/L (ref 3.5–5.1)
Sodium: 140 mmol/L (ref 135–145)

## 2015-06-03 MED ORDER — CHLORDIAZEPOXIDE HCL 25 MG PO CAPS
25.0000 mg | ORAL_CAPSULE | Freq: Three times a day (TID) | ORAL | Status: DC | PRN
  Administered 2015-06-03 – 2015-06-04 (×3): 25 mg via ORAL
  Filled 2015-06-03 (×4): qty 1

## 2015-06-03 NOTE — Progress Notes (Signed)
Occupational Therapy Evaluation Patient Details Name: [name] Stewart MRN: [id number] DOB: 10/25/1980 Today's Date: 06/03/2015    History of Present Illness 35 yo female admitted with ETOH withdrawal, fall. Hx of ETOH abuse, bipolar d/o, ETOH related Sz, PTSD, anxiety, depression   Clinical Impression   PTA, pt states she was independent with ADL and mobility. Pt currently requires assistance for her ADL and IADL tasks.  Pt states she was sober 30 days prior to her boyfriend's death. States when she is drinking she is unable to manage her care. When sober, states she enjoys dancing, exercise and being with her cats, and was recently taking [employer] lessons. Pt states she understands that she needs ETOH rehab and wants to be able to be sober. Feel pt will benefit from inpatient substance abuse rehab program. Will defer further OT needs to next venue of care.     Follow Up Recommendations  Other (comment) (substance abuse rehab is plan)    Equipment Recommendations  None recommended by OT    Recommendations for Other Services       Precautions / Restrictions Precautions Precautions: Fall Restrictions Weight Bearing Restrictions: No      Mobility Bed Mobility Overal bed mobility: Modified Independent                Transfers Overall transfer level: Needs assistance   Transfers: Sit to/from Stand Sit to Stand: Min guard (due to meds/unsteadiness)              Balance Overall balance assessment: Needs assistance   Sitting balance-Leahy Scale: Good       Standing balance-Leahy Scale: Fair                              ADL Overall ADL's : Needs assistance/impaired                         Toilet Transfer: Minimal assistance StatisticianToilet Transfer Details (indicate cue type and reason): steady assist due to unsteadiness from meds         Functional mobility during ADLs: Minimal assistance (due to unsteadiness from medicaiton) General ADL Comments: Pt  overall set up for ADL with the exception of Minguard A for LB ADL due to lethargy/unsteadiness for meds. Pt currently needs assistance for IADL tasks. Pt states swhen she is drinking that she can not manage her IADL tasks. when not drinking, pt enjoys dancing, exercise and her cats.      Vision     Perception     Praxis      Pertinent Vitals/Pain Pain Assessment: 0-10 Pain Score: 3  Pain Location: R shoulder Pain Descriptors / Indicators: Sore Pain Intervention(s): Limited activity within patient's tolerance;Repositioned;Heat applied     Hand Dominance Right   Extremity/Trunk Assessment Upper Extremity Assessment Upper Extremity Assessment: RUE deficits/detail RUE Deficits / Details: Pain around R shoulder blade. Pain with AROM, but able to achieve funcitonal ROM. Heat applied   Lower Extremity Assessment Lower Extremity Assessment: Overall WFL for tasks assessed   Cervical / Trunk Assessment Cervical / Trunk Assessment: Normal   Communication Communication Communication: No difficulties   Cognition Arousal/Alertness: Lethargic;Suspect due to medications Behavior During Therapy: Flat affect Overall Cognitive Status: Within Functional Limits for tasks assessed                     General Comments       Exercises  Shoulder Instructions      Home Living Family/patient expects to be discharged to:: Other (Comment) (rehab)                                        Prior Functioning/Environment Level of Independence: Independent        Comments: unless I'm drinking    OT Diagnosis: Generalized weakness;Acute pain   OT Problem List: Decreased range of motion;Decreased strength;Decreased activity tolerance;Impaired balance (sitting and/or standing);Pain;Impaired UE functional use   OT Treatment/Interventions:      OT Goals(Current goals can be found in the care plan section) Acute Rehab OT Goals Patient Stated Goal: alcohol  rehab OT Goal Formulation: All assessment and education complete, DC therapy  OT Frequency:     Barriers to D/C:            Co-evaluation              End of Session Nurse Communication: Mobility status  Activity Tolerance: Patient tolerated treatment well Patient left: in bed;with call bell/phone within reach;with nursing/sitter in room   Time: 1308-6578 OT Time Calculation (min): 15 min Charges:  OT General Charges $OT Visit: 1 Procedure OT Evaluation $OT Eval Moderate Complexity: 1 Procedure G-Codes:    Javiana Anwar,HILLARY 2015/06/15, 10:00 AM   Luisa Dago, OTR/L  [phone] 2015/06/15

## 2015-06-03 NOTE — Progress Notes (Signed)
[facility] PROGRESS NOTE  [name] Stewart WJX:[id number] DOB: 11-28-1980 DOA: 05/29/2015  PCP: Verlon Au, MD  Brief HPI: 35 year old Caucasian female with a past medical history of alcoholism, bipolar disorder, presented with signs and symptoms suggestive of alcohol withdrawal. She was hospitalized for further management.  Past medical history:  Past Medical History  Diagnosis Date  . Alcoholic (HCC)   . Anxiety   . Post traumatic stress disorder (PTSD)   . Alcohol related seizure (HCC)   . Bipolar disorder (HCC)   . Tension headache, chronic     "monthly" (01/31/2015)  . Depression     Consultants: Psychiatry  Procedures: None  Antibiotics: None  Subjective: Patient had multiple questions about the rehabilitation facility that she will be discharged to the next day or so. Denies any pain. Some anxiety at times. Overall feels better.   Objective:  Vital Signs  Filed Vitals:   06/02/15 0528 06/02/15 1316 06/02/15 2103 06/03/15 0603  BP: 95/65 107/63 102/71 108/61  Pulse: 82 86 78 87  Temp: 98.3 F (36.8 C) 98.3 F (36.8 C) 98.7 F (37.1 C) 97.7 F (36.5 C)  TempSrc: Oral Oral Oral Oral  Resp: Height:      Weight:      SpO2: 97% 98% 98% 97%    Intake/Output Summary (Last 24 hours) at 06/03/15 0932 Last data filed at 06/03/15 0804  Gross per 24 hour  Intake   1100 ml  Output      0 ml  Net   1100 ml   Filed Weights   05/29/15 2335  Weight: 63.2 kg (139 lb 5.3 oz)    General appearance: alert, cooperative, appears stated age and no distress Resp: Improved air entry bilaterally.  Cardio: regular rate and rhythm, S1, S2 normal, no murmur, click, rub or gallop GI: soft, non-tender; bowel sounds normal; no masses,  no organomegaly Neurologic: Awake and alert. Oriented 3. No focal neurological deficits.  Lab Results:  Data Reviewed: I have personally reviewed following labs and imaging studies  CBC:  Recent Labs Lab  05/28/15 1409 05/29/15 1658 05/30/15 0042  WBC 6.6 7.0 5.5  NEUTROABS  --  2.8  --   HGB 15.1* 15.3* 11.6*  HCT 44.1 44.6 33.2*  MCV 86.5 86.3 84.3  PLT 395 436* 309   Basic Metabolic Panel:  Recent Labs Lab 05/28/15 1409 05/29/15 1658 05/30/15 0042 06/01/15 0305 06/03/15 0521  NA 141 145 140 140 140  K 4.0 4.1 3.7 3.7 3.9  CL 101 104 104 107 105  CO2 15* 21* 21* 25 26  GLUCOSE 75 88 83 100* 103*  BUN 14 6 <5* 9 19  CREATININE 0.65 0.61 0.40* 0.51 0.67  CALCIUM 9.0 8.9 7.6* 8.9 9.5   GFR: Estimated Creatinine Clearance: 84.8 mL/min (by C-G formula based on Cr of 0.67).  Liver Function Tests:  Recent Labs Lab 05/28/15 1409 05/29/15 1658 05/30/15 0042 06/01/15 0305  AST 295* 413* 318* 371*  ALT 412* 436* 338* 378*  ALKPHOS 43 46 34* 51  BILITOT 0.3 0.7 0.7 0.5  PROT 8.7* 9.2* 7.1 6.8  ALBUMIN 4.7 4.9 3.8 3.7    Recent Labs Lab 05/29/15 1658  LIPASE 40   Urine analysis:    Component Value Date/Time   COLORURINE YELLOW 05/29/2015 1932   APPEARANCEUR CLOUDY* 05/29/2015 1932   LABSPEC 1.013 05/29/2015 1932   PHURINE 5.0 05/29/2015 1932   GLUCOSEU NEGATIVE 05/29/2015 1932   HGBUR SMALL* 05/29/2015  1932   BILIRUBINUR NEGATIVE 05/29/2015 1932   KETONESUR 40* 05/29/2015 1932   PROTEINUR 100* 05/29/2015 1932   UROBILINOGEN 0.2 12/31/2013 2136   NITRITE NEGATIVE 05/29/2015 1932   LEUKOCYTESUR NEGATIVE 05/29/2015 1932    Recent Results (from the past 240 hour(s))  MRSA PCR Screening     Status: None   Collection Time: 05/29/15 11:59 PM  Result Value Ref Range Status   MRSA by PCR NEGATIVE NEGATIVE Final    Comment:        The GeneXpert MRSA Assay (FDA approved for NASAL specimens only), is one component of a comprehensive MRSA colonization surveillance program. It is not intended to diagnose MRSA infection nor to guide or monitor treatment for MRSA infections.       Radiology Studies: Dg Thoracic Spine 2 View  06/01/2015  CLINICAL DATA:   Mid back pain.  Unwitnessed fall. EXAM: THORACIC SPINE 2 VIEWS COMPARISON:  05/29/2015 FINDINGS: No vertebral compression deformity.  Anatomic alignment. IMPRESSION: No acute bony pathology. Electronically Signed   By: Jolaine Click M.D.   On: 06/01/2015 10:02   Dg Lumbar Spine 2-3 Views  06/01/2015  CLINICAL DATA:  Back pain . EXAM: LUMBAR SPINE - 2-3 VIEW COMPARISON:  No recent prior . FINDINGS: Multilevel mild degenerative change. No acute bony abnormality identified. Normal Mild scoliosis concave right. Normal mineralization. IMPRESSION: Mild scoliosis concave right.  No acute abnormality . Electronically Signed   By: Maisie Fus  Register   On: 06/01/2015 10:04     Medications:  Scheduled: . busPIRone  10 mg Oral TID  . folic acid  1 mg Oral Daily  . gabapentin  100 mg Oral TID  . lamoTRIgine  50 mg Oral q morning - 10a  . lurasidone  80 mg Oral QHS  . multivitamin with minerals  1 tablet Oral Daily  . pantoprazole  40 mg Oral Daily  . prazosin  3 mg Oral QHS  . sodium chloride flush  3 mL Intravenous Q12H  . thiamine  100 mg Oral Daily   Continuous:   [id number] **OR** acetaminophen, chlordiazePOXIDE, naphazoline, ondansetron **OR** ondansetron (ZOFRAN) IV, oxyCODONE, polyvinyl alcohol, tiZANidine  Assessment/Plan:  Principal Problem:   Alcohol withdrawal (HCC) Active Problems:   GERD (gastroesophageal reflux disease)   Alcohol dependence (HCC)   Depression    Acute EtOH withdrawal Patient is much improved. Changed to when necessary Librium. Continue multivitamins, folate and thiamine. Patient has been seen by Child psychotherapist. Acute rehabilitation for alcoholism, has been offered. However, patient wanted to go home first. But, per psychiatry, patient will remain at high risk for relapse if she is allowed to go home. They recommended involuntarily committing her. Plan is for her to go to an inpatient rehabilitation facility on Monday. Continue mobilizing the patient in the  hospital.   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 Will need outpatient follow-up for same.  Back pain S/P fall Rib films and chest x-ray did not show any acute changes. X-ray of the thoracic and lumbar spine without any acute findings either. She does not have any neurological deficits. Pain control. Mobilize.  Extensive psych history with Bipolar disorder, PTSD, depression Patient seen by psychiatry. She is on multiple psychotropic medications. Management per psychiatry.  History of essential Hypertension Blood pressure is stable. Continue prazosin 3 mg daily   DVT Prophylaxis: SCD's    Code Status: Full code  Family Communication: Discussed with the patient  Disposition Plan: Await placement to Drug rehabilitation center.    LOS: 5 days  [facility] CenterKRISHNAN,Gildardo Tickner  [facility] Pager [phone] 06/03/2015, 9:32 AM  If 7PM-7AM, please contact night-coverage at [email or web address], password [email or web address]

## 2015-06-04 NOTE — Progress Notes (Signed)
LCSW continues to follow patient while in hospital. Plan remains IVC to [facility] Call placed to [facility] and patient remains under review.  No bed currently. Updated medical notes from weekend faxed to show patient improving and medical clearance. LCSW also faxed over IVC paperwork that was completed on Friday.  Awaiting bed.  Will continue to assist with need. Psych progress to come today once patient seen.  Deretha EmoryHannah Rhydian Baldi LCSW, MSW Clinical Social Work: [facility] [employer] [phone]

## 2015-06-04 NOTE — Progress Notes (Signed)
[facility] PROGRESS NOTE  Thomes Lollingrin Yeo ZOX:[id number] DOB: 09/18/1980 DOA: 05/29/2015  PCP: Verlon AuBoyd, Tammy Lamonica, MD  Brief HPI: 35 year old Caucasian female with a past medical history of alcoholism, bipolar disorder, presented with signs and symptoms suggestive of alcohol withdrawal. She was hospitalized for further management. She was stabilized. She was transferred to the floor. Now we await placement to  drug rehabilitation center. She is involuntarily committed.  Past medical history:  Past Medical History  Diagnosis Date  . Alcoholic (HCC)   . Anxiety   . Post traumatic stress disorder (PTSD)   . Alcohol related seizure (HCC)   . Bipolar disorder (HCC)   . Tension headache, chronic     "monthly" (01/31/2015)  . Depression     Consultants: Psychiatry  Procedures: None  Antibiotics: None  Subjective: Patient denies any complaints. Feeling better.   Objective:  Vital Signs  Filed Vitals:   06/03/15 0603 06/03/15 1408 06/04/15 0101 06/04/15 0640  BP: 108/61 103/66 104/65 91/63  Pulse: 87 86 83 79  Temp: 97.7 F (36.5 C) 97.7 F (36.5 C) 98.4 F (36.9 C) 97.7 F (36.5 C)  TempSrc: Oral Oral Oral Oral  Resp: 16 20 18 16   Height:      Weight:      SpO2: 97% 97% 98% 99%    Intake/Output Summary (Last 24 hours) at 06/04/15 0825 Last data filed at 06/03/15 1902  Gross per 24 hour  Intake   1100 ml  Output      0 ml  Net   1100 ml   Filed Weights   05/29/15 2335  Weight: 63.2 kg (139 lb 5.3 oz)    General appearance: alert, cooperative Resp: Improved air entry bilaterally.  Cardio: regular rate and rhythm, S1, S2 normal, no murmur, click, rub or gallop GI: soft, non-tender; bowel sounds normal; no masses,  no organomegaly Neurologic: Awake and alert. Oriented 3. No focal neurological deficits.  Lab Results:  Data Reviewed: I have personally reviewed following labs and imaging studies  CBC:  Recent Labs Lab 05/28/15 1409 05/29/15 1658  05/30/15 0042  WBC 6.6 7.0 5.5  NEUTROABS  --  2.8  --   HGB 15.1* 15.3* 11.6*  HCT 44.1 44.6 33.2*  MCV 86.5 86.3 84.3  PLT 395 436* 309   Basic Metabolic Panel:  Recent Labs Lab 05/28/15 1409 05/29/15 1658 05/30/15 0042 06/01/15 0305 06/03/15 0521  NA 141 145 140 140 140  K 4.0 4.1 3.7 3.7 3.9  CL 101 104 104 107 105  CO2 15* 21* 21* 25 26  GLUCOSE 75 88 83 100* 103*  BUN 14 6 <5* 9 19  CREATININE 0.65 0.61 0.40* 0.51 0.67  CALCIUM 9.0 8.9 7.6* 8.9 9.5   GFR: Estimated Creatinine Clearance: 84.8 mL/min (by C-G formula based on Cr of 0.67).  Liver Function Tests:  Recent Labs Lab 05/28/15 1409 05/29/15 1658 05/30/15 0042 06/01/15 0305  AST 295* 413* 318* 371*  ALT 412* 436* 338* 378*  ALKPHOS 43 46 34* 51  BILITOT 0.3 0.7 0.7 0.5  PROT 8.7* 9.2* 7.1 6.8  ALBUMIN 4.7 4.9 3.8 3.7    Recent Labs Lab 05/29/15 1658  LIPASE 40   Urine analysis:    Component Value Date/Time   COLORURINE YELLOW 05/29/2015 1932   APPEARANCEUR CLOUDY* 05/29/2015 1932   LABSPEC 1.013 05/29/2015 1932   PHURINE 5.0 05/29/2015 1932   GLUCOSEU NEGATIVE 05/29/2015 1932   HGBUR SMALL* 05/29/2015 1932   BILIRUBINUR NEGATIVE 05/29/2015 1932  KETONESUR 40* 05/29/2015 1932   PROTEINUR 100* 05/29/2015 1932   UROBILINOGEN 0.2 12/31/2013 2136   NITRITE NEGATIVE 05/29/2015 1932   LEUKOCYTESUR NEGATIVE 05/29/2015 1932    Recent Results (from the past 240 hour(s))  MRSA PCR Screening     Status: None   Collection Time: 05/29/15 11:59 PM  Result Value Ref Range Status   MRSA by PCR NEGATIVE NEGATIVE Final    Comment:        The GeneXpert MRSA Assay (FDA approved for NASAL specimens only), is one component of a comprehensive MRSA colonization surveillance program. It is not intended to diagnose MRSA infection nor to guide or monitor treatment for MRSA infections.       Radiology Studies: No results found.   Medications:  Scheduled: . busPIRone  10 mg Oral TID  .  folic acid  1 mg Oral Daily  . gabapentin  100 mg Oral TID  . lamoTRIgine  50 mg Oral q morning - 10a  . lurasidone  80 mg Oral QHS  . multivitamin with minerals  1 tablet Oral Daily  . pantoprazole  40 mg Oral Daily  . prazosin  3 mg Oral QHS  . sodium chloride flush  3 mL Intravenous Q12H  . thiamine  100 mg Oral Daily   Continuous:   ZOX:[id number] **OR** acetaminophen, chlordiazePOXIDE, naphazoline, ondansetron **OR** ondansetron (ZOFRAN) IV, oxyCODONE, polyvinyl alcohol, tiZANidine  Assessment/Plan:  Principal Problem:   Alcohol withdrawal (HCC) Active Problems:   GERD (gastroesophageal reflux disease)   Alcohol dependence (HCC)   Depression    Acute EtOH withdrawal Patient is much improved. No longer has symptoms of withdrawal. Continue when necessary Librium. Continue multivitamins, folate and thiamine. Patient has been seen by Child psychotherapist. Acute rehabilitation for alcoholism, has been offered. However, patient wanted to go home first. But, per psychiatry, patient will remain at high risk for relapse if she is allowed to go home. They recommended involuntarily committing her. Plan is for her to go to an inpatient rehabilitation facility when a bed is available. Continue mobilizing the patient in the hospital.   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 Will need outpatient follow-up for same.  Back pain S/P fall Rib films and chest x-ray did not show any acute changes. X-ray of the thoracic and lumbar spine without any acute findings either. She does not have any neurological deficits. Pain control. Mobilize.  Extensive psych history with Bipolar disorder, PTSD, depression Patient seen by psychiatry. She is on multiple psychotropic medications. Management per psychiatry.  History of essential Hypertension Blood pressure is stable. Continue prazosin 3 mg daily   DVT Prophylaxis: SCD's    Code Status: Full code  Family Communication: Discussed with the patient    Disposition Plan: Await placement to Drug rehabilitation center. Patient remains medically stable.    LOS: 6 days   [facility]  [facility] Pager [phone] 06/04/2015, 8:25 AM  If 7PM-7AM, please contact night-coverage at [email or web address], password [facility]

## 2015-06-05 NOTE — Progress Notes (Signed)
CSW assisting with d/c planning. Pt has a bed at [facility]  06/06/15. Pt / MD / NSG are aware. Sheriff contacted to arrange transportation. [facility] [place] 9[facility]30 [facility]. IVC papers are in chart and need to be given to transporting officer.   Cori RazorJamie Kyle Stansell LCSW [phone]

## 2015-06-05 NOTE — Discharge Summary (Addendum)
[facility]  Physician Discharge Summary   Patient ID: [name] Stewart MRN: [id number] DOB/AGE: May 30, 1980 34 y.o.  Admit date: 05/29/2015 Discharge date: 06/05/2015  PCP: Verlon Au, MD  DISCHARGE DIAGNOSES:  Principal Problem:   Alcohol withdrawal (HCC) Active Problems:   GERD (gastroesophageal reflux disease)   Alcohol dependence (HCC)   Depression   RECOMMENDATIONS FOR OUTPATIENT FOLLOW UP: 1. Outpatient monitoring of LFTs and alcoholic hepatitis  DISCHARGE CONDITION: fair  Diet recommendation: Regular  Filed Weights   05/29/15 2335  Weight: 63.2 kg (139 lb 5.3 oz)    INITIAL HISTORY: 35 year old Caucasian female with a past medical history of alcoholism, bipolar disorder, presented with signs and symptoms suggestive of alcohol withdrawal. She was hospitalized for further management. She was stabilized. She was transferred to the floor. Now we await placement to drug rehabilitation center. She is involuntarily committed.  Consultations:  Psychiatry  Procedures:  None  HOSPITAL COURSE:   Acute EtOH withdrawal Patient was admitted to the stepdown unit. She was placed on the alcohol withdrawal protocol. She slowly started improving. Her symptoms resolved. She is currently on when necessary Librium. Continue multivitamins, folate and thiamine. Patient has been seen by Child psychotherapist. Acute rehabilitation for alcoholism, has been offered. However, patient wanted to go home first. But, per psychiatry, patient will remain at high risk for relapse if she is allowed to go home. They recommended involuntarily committing her. Plan is for her to go to an inpatient rehabilitation facility when a bed is available. Patient remains medically stable.  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 Will need outpatient follow-up for same.  Back pain S/P fall Rib films and chest x-ray did not show any acute changes. X-ray of the thoracic and lumbar spine without any acute findings  either. She does not have any neurological deficits. Pain was adequately controlled. She was able to ambulate without any difficulties.  Extensive psych history with Bipolar disorder, PTSD, depression Patient seen by psychiatry. She is on multiple psychotropic medications. Management was per psychiatry.  History of essential Hypertension Blood pressure is noted to be borderline low. We will discontinue prazosin for now.   Overall, stable and improved. She remains medically stable for discharge to rehabilitation center.   PERTINENT LABS:  The results of significant diagnostics from this hospitalization (including imaging, microbiology, ancillary and laboratory) are listed below for reference.    Microbiology: Recent Results (from the past 240 hour(s))  MRSA PCR Screening     Status: None   Collection Time: 05/29/15 11:59 PM  Result Value Ref Range Status   MRSA by PCR NEGATIVE NEGATIVE Final    Comment:        The GeneXpert MRSA Assay (FDA approved for NASAL specimens only), is one component of a comprehensive MRSA colonization surveillance program. It is not intended to diagnose MRSA infection nor to guide or monitor treatment for MRSA infections.      Labs: Basic Metabolic Panel:  Recent Labs Lab 05/29/15 1658 05/30/15 0042 06/01/15 0305 06/03/15 0521  NA 145 140 140 140  K 4.1 3.7 3.7 3.9  CL 104 104 107 105  CO2 21* 21* 25 26  GLUCOSE 88 83 100* 103*  BUN 6 <5* 9 19  CREATININE 0.61 0.40* 0.51 0.67  CALCIUM 8.9 7.6* 8.9 9.5   Liver Function Tests:  Recent Labs Lab 05/29/15 1658 05/30/15 0042 06/01/15 0305  AST 413* 318* 371*  ALT 436* 338* 378*  ALKPHOS 46 34* 51  BILITOT 0.7 0.7 0.5  PROT  9.2* 7.1 6.8  ALBUMIN 4.9 3.8 3.7    Recent Labs Lab 05/29/15 1658  LIPASE 40   CBC:  Recent Labs Lab 05/29/15 1658 05/30/15 0042  WBC 7.0 5.5  NEUTROABS 2.8  --   HGB 15.3* 11.6*  HCT 44.6 33.2*  MCV 86.3 84.3  PLT 436* 309     IMAGING  STUDIES Dg Chest 2 View  05/29/2015  CLINICAL DATA:  Larey SeatFell this morning, now with upper right back pain and ecchymosis. EXAM: CHEST  2 VIEW COMPARISON:  06/16/2014 FINDINGS: Right rib images are negative for displaced fracture. There is no bone lesion or bony destruction. PA and lateral views of the chest are negative for pneumothorax, hemothorax or significant pulmonary contusion. The lungs are clear. Hilar and mediastinal contours are normal. Heart size is normal. Pulmonary vasculature is normal. IMPRESSION: No acute findings. Electronically Signed   By: Ellery Plunkaniel R Mitchell M.D.   On: 05/29/2015 23:26   Dg Ribs Unilateral Right  05/29/2015  CLINICAL DATA:  Larey SeatFell this morning, now with upper right back pain and ecchymosis. EXAM: CHEST  2 VIEW COMPARISON:  06/16/2014 FINDINGS: Right rib images are negative for displaced fracture. There is no bone lesion or bony destruction. PA and lateral views of the chest are negative for pneumothorax, hemothorax or significant pulmonary contusion. The lungs are clear. Hilar and mediastinal contours are normal. Heart size is normal. Pulmonary vasculature is normal. IMPRESSION: No acute findings. Electronically Signed   By: Ellery Plunkaniel R Mitchell M.D.   On: 05/29/2015 23:26   Dg Thoracic Spine 2 View  06/01/2015  CLINICAL DATA:  Mid back pain.  Unwitnessed fall. EXAM: THORACIC SPINE 2 VIEWS COMPARISON:  05/29/2015 FINDINGS: No vertebral compression deformity.  Anatomic alignment. IMPRESSION: No acute bony pathology. Electronically Signed   By: Jolaine ClickArthur  Hoss M.D.   On: 06/01/2015 10:02   Dg Lumbar Spine 2-3 Views  06/01/2015  CLINICAL DATA:  Back pain . EXAM: LUMBAR SPINE - 2-3 VIEW COMPARISON:  No recent prior . FINDINGS: Multilevel mild degenerative change. No acute bony abnormality identified. Normal Mild scoliosis concave right. Normal mineralization. IMPRESSION: Mild scoliosis concave right.  No acute abnormality . Electronically Signed   By: Maisie Fushomas  Register   On: 06/01/2015  10:04   Ct Head Wo Contrast  05/29/2015  CLINICAL DATA:  Head trauma.  Alcohol abuse. EXAM: CT HEAD WITHOUT CONTRAST TECHNIQUE: Contiguous axial images were obtained from the base of the skull through the vertex without intravenous contrast. COMPARISON:  05/16/2015 FINDINGS: There is a minimal amount of residual subcutaneous stranding at the site of the previous identified hematoma/laceration involving the right side of the forehead (image 17, series 2). Regional soft tissues appear otherwise normal. No radiopaque foreign body. No displaced calvarial fracture. Gray-white differentiation is maintained. No CT evidence of acute large territory infarct. No intraparenchymal or extra-axial mass or hemorrhage. Unchanged size and configuration of the ventricles and basilar cisterns. No midline shift. There is underpneumatization the bilateral frontal sinuses. The remaining paranasal sinuses and mastoid air cells are normal. No air-fluid levels. IMPRESSION: 1. Minimal amount of residual subcutaneous stranding at the site of previous identified hematoma/laceration involving the right side of the forehead without definitive superimposed acute traumatic injury to the head. 2. Otherwise, negative noncontrast head CT. Electronically Signed   By: Simonne ComeJohn  Watts M.D.   On: 05/29/2015 17:42   Ct Head Wo Contrast  05/16/2015  CLINICAL DATA:  35 year old female found down and head with blood on head. History of alcohol abuse. EXAM:  CT HEAD WITHOUT CONTRAST CT CERVICAL SPINE WITHOUT CONTRAST TECHNIQUE: Multidetector CT imaging of the head and cervical spine was performed following the standard protocol without intravenous contrast. Multiplanar CT image reconstructions of the cervical spine were also generated. COMPARISON:  Head CT and cervical spine CT 02/17/2015. FINDINGS: CT HEAD FINDINGS Right frontal scalp high attenuation fluid compatible with a scalp hematoma and probable laceration. No acute displaced skull fractures are  identified. No acute intracranial abnormality. Specifically, no evidence of acute post-traumatic intracranial hemorrhage, no definite regions of acute/subacute cerebral ischemia, no focal mass, mass effect, hydrocephalus or abnormal intra or extra-axial fluid collections. The visualized paranasal sinuses and mastoids are well pneumatized. CT CERVICAL SPINE FINDINGS No acute displaced fractures of the cervical spine. Alignment is anatomic. Prevertebral soft tissues are normal. Visualized portions of the upper thorax are unremarkable. IMPRESSION: 1. Right frontal scalp hematoma/laceration. 2. No acute displaced skull fracture or findings to suggest significant acute traumatic injury to the brain. 3. No evidence of acute traumatic injury to the cervical spine. 4. The appearance of the brain is normal. Electronically Signed   By: Trudie Reed M.D.   On: 05/16/2015 21:49   Ct Cervical Spine Wo Contrast  05/16/2015  CLINICAL DATA:  35 year old female found down and head with blood on head. History of alcohol abuse. EXAM: CT HEAD WITHOUT CONTRAST CT CERVICAL SPINE WITHOUT CONTRAST TECHNIQUE: Multidetector CT imaging of the head and cervical spine was performed following the standard protocol without intravenous contrast. Multiplanar CT image reconstructions of the cervical spine were also generated. COMPARISON:  Head CT and cervical spine CT 02/17/2015. FINDINGS: CT HEAD FINDINGS Right frontal scalp high attenuation fluid compatible with a scalp hematoma and probable laceration. No acute displaced skull fractures are identified. No acute intracranial abnormality. Specifically, no evidence of acute post-traumatic intracranial hemorrhage, no definite regions of acute/subacute cerebral ischemia, no focal mass, mass effect, hydrocephalus or abnormal intra or extra-axial fluid collections. The visualized paranasal sinuses and mastoids are well pneumatized. CT CERVICAL SPINE FINDINGS No acute displaced fractures of the  cervical spine. Alignment is anatomic. Prevertebral soft tissues are normal. Visualized portions of the upper thorax are unremarkable. IMPRESSION: 1. Right frontal scalp hematoma/laceration. 2. No acute displaced skull fracture or findings to suggest significant acute traumatic injury to the brain. 3. No evidence of acute traumatic injury to the cervical spine. 4. The appearance of the brain is normal. Electronically Signed   By: Trudie Reed M.D.   On: 05/16/2015 21:49    DISCHARGE EXAMINATION: Filed Vitals:   06/04/15 0640 06/04/15 1325 06/04/15 1959 06/05/15 0617  BP: 91/63 112/64 101/68 98/66  Pulse: 79 93 86 71  Temp: 97.7 F (36.5 C) 97.9 F (36.6 C) 97.7 F (36.5 C) 97.7 F (36.5 C)  TempSrc: Oral Axillary Oral Oral  Resp: 16 18 16 16   Height:      Weight:      SpO2: 99% 99% 98% 98%   General appearance: alert, cooperative, appears stated age and no distress Resp: clear to auscultation bilaterally Cardio: regular rate and rhythm, S1, S2 normal, no murmur, click, rub or gallop GI: soft, non-tender; bowel sounds normal; no masses,  no organomegaly Extremities: extremities normal, atraumatic, no cyanosis or edema  DISPOSITION: To drug rehabilitation center    ALLERGIES:  Allergies  Allergen Reactions  . Naltrexone Other (See Comments)    Makes her hurt everywhere. Anything higher than 25mg  gives her the issues.. Can take in smaller doses  . Baclofen Rash  Current Inpatient Medications:  Scheduled: . busPIRone  10 mg Oral TID  . folic acid  1 mg Oral Daily  . gabapentin  100 mg Oral TID  . lamoTRIgine  50 mg Oral q morning - 10a  . lurasidone  80 mg Oral QHS  . multivitamin with minerals  1 tablet Oral Daily  . pantoprazole  40 mg Oral Daily  . sodium chloride flush  3 mL Intravenous Q12H  . thiamine  100 mg Oral Daily   Continuous:  [id number] **OR** acetaminophen, chlordiazePOXIDE, naphazoline, ondansetron **OR** ondansetron (ZOFRAN) IV, oxyCODONE,  polyvinyl alcohol, tiZANidine  Final discharge medication plan to be addressed when the patient was discharged home from rehabilitation center.  TOTAL DISCHARGE TIME: 35 minutes  [facility]  [facility] Pager [phone]  06/05/2015, 12:06 PM

## 2015-06-05 NOTE — Progress Notes (Addendum)
CSW assisting with d/c planning. Olegario MessierKathy from [facility] [phone]( [phone] ) requested updated clinicals. Admission decision is still pending. CSW will send requested info and update MD / Pt / NSG once a decision has been made.  Cori RazorJamie Abbigal Radich LCSW [phone]

## 2015-06-06 NOTE — Discharge Summary (Signed)
Physician Discharge Summary  [name] Stewart:[id number] DOB: [birth year] DOA: 05/29/2015  PCP: Verlon Au, MD  Admit date: 05/29/2015 Discharge date: 06/06/2015  Recommendations for Outpatient Follow-up:  1. Follow up with PCP in 1-2 weeks 2. Please obtain BMP/CBC in one week   Discharge Diagnoses:  Acute EtOH withdrawal Patient was admitted to the stepdown unit. She was placed on the alcohol withdrawal protocol. She slowly started improving. Her symptoms resolved. She is currently on when necessary Librium. Continue multivitamins, folate and thiamine. Patient has been seen by Child psychotherapist. Acute rehabilitation for alcoholism, has been offered. However, patient wanted to go home first. But, per psychiatry, patient will remain at high risk for relapse if she is allowed to go home. They recommended involuntarily committing her. Plan is for her to go to an inpatient rehabilitation facility when a bed is available. Patient remains medically stable.  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 Will need outpatient follow-up for same.  Back pain S/P fall Rib films and chest x-ray did not show any acute changes. X-ray of the thoracic and lumbar spine without any acute findings either. She does not have any neurological deficits. Pain was adequately controlled. She was able to ambulate without any difficulties.  Extensive psych history with Bipolar disorder, PTSD, depression Patient seen by psychiatry. She is on multiple psychotropic medications. Management was per psychiatry.  History of essential Hypertension Blood pressure is noted to be borderline low. We will discontinue prazosin for now.   Overall, stable and improved. She remains medically stable for discharge to rehabilitation center.   Discharge Condition: Stable  Disposition:  [facility]  Diet recommendation: regular  History of present illness:  [name] Stewart is a 35 y.o. female with a history of alcoholism, bipolar disorder, presented  with signs and symptoms suggestive of alcohol withdrawal. She was hospitalized for further management. She was stabilized. She was transferred to the floor. Now we await placement to drug rehabilitation center. The patient was seen by psychiatry during the hospitalization. They did not feel the patient was stable for discharge. She was involuntarily committed. The patient did not have any further signs of withdrawal. Her lab work was stable and the patient was clinically stable. The patient did not require any benzodiazepines for 48 hours prior to discharge.   Consultations:  Pyschiatry   Procedures/Studies: Dg Chest 2 View  05/29/2015  CLINICAL DATA:  Larey Seat this morning, now with upper right back pain and ecchymosis. EXAM: CHEST  2 VIEW COMPARISON:  06/16/2014 FINDINGS: Right rib images are negative for displaced fracture. There is no bone lesion or bony destruction. PA and lateral views of the chest are negative for pneumothorax, hemothorax or significant pulmonary contusion. The lungs are clear. Hilar and mediastinal contours are normal. Heart size is normal. Pulmonary vasculature is normal. IMPRESSION: No acute findings. Electronically Signed   By: Ellery Plunk M.D.   On: 05/29/2015 23:26   Dg Ribs Unilateral Right  05/29/2015  CLINICAL DATA:  Larey Seat this morning, now with upper right back pain and ecchymosis. EXAM: CHEST  2 VIEW COMPARISON:  06/16/2014 FINDINGS: Right rib images are negative for displaced fracture. There is no bone lesion or bony destruction. PA and lateral views of the chest are negative for pneumothorax, hemothorax or significant pulmonary contusion. The lungs are clear. Hilar and mediastinal contours are normal. Heart size is normal. Pulmonary vasculature is normal. IMPRESSION: No acute findings. Electronically Signed   By: Ellery Plunk M.D.   On: 05/29/2015 23:26   Dg Thoracic  Spine 2 View  06/01/2015  CLINICAL DATA:  Mid back pain.  Unwitnessed fall. EXAM: THORACIC  SPINE 2 VIEWS COMPARISON:  05/29/2015 FINDINGS: No vertebral compression deformity.  Anatomic alignment. IMPRESSION: No acute bony pathology. Electronically Signed   By: Jolaine Click M.D.   On: 06/01/2015 10:02   Dg Lumbar Spine 2-3 Views  06/01/2015  CLINICAL DATA:  Back pain . EXAM: LUMBAR SPINE - 2-3 VIEW COMPARISON:  No recent prior . FINDINGS: Multilevel mild degenerative change. No acute bony abnormality identified. Normal Mild scoliosis concave right. Normal mineralization. IMPRESSION: Mild scoliosis concave right.  No acute abnormality . Electronically Signed   By: Maisie Fus  Register   On: 06/01/2015 10:04   Ct Head Wo Contrast  05/29/2015  CLINICAL DATA:  Head trauma.  Alcohol abuse. EXAM: CT HEAD WITHOUT CONTRAST TECHNIQUE: Contiguous axial images were obtained from the base of the skull through the vertex without intravenous contrast. COMPARISON:  05/16/2015 FINDINGS: There is a minimal amount of residual subcutaneous stranding at the site of the previous identified hematoma/laceration involving the right side of the forehead (image 17, series 2). Regional soft tissues appear otherwise normal. No radiopaque foreign body. No displaced calvarial fracture. Gray-white differentiation is maintained. No CT evidence of acute large territory infarct. No intraparenchymal or extra-axial mass or hemorrhage. Unchanged size and configuration of the ventricles and basilar cisterns. No midline shift. There is underpneumatization the bilateral frontal sinuses. The remaining paranasal sinuses and mastoid air cells are normal. No air-fluid levels. IMPRESSION: 1. Minimal amount of residual subcutaneous stranding at the site of previous identified hematoma/laceration involving the right side of the forehead without definitive superimposed acute traumatic injury to the head. 2. Otherwise, negative noncontrast head CT. Electronically Signed   By: Simonne Come M.D.   On: 05/29/2015 17:42   Ct Head Wo Contrast  05/16/2015   CLINICAL DATA:  35 year old female found down and head with blood on head. History of alcohol abuse. EXAM: CT HEAD WITHOUT CONTRAST CT CERVICAL SPINE WITHOUT CONTRAST TECHNIQUE: Multidetector CT imaging of the head and cervical spine was performed following the standard protocol without intravenous contrast. Multiplanar CT image reconstructions of the cervical spine were also generated. COMPARISON:  Head CT and cervical spine CT 02/17/2015. FINDINGS: CT HEAD FINDINGS Right frontal scalp high attenuation fluid compatible with a scalp hematoma and probable laceration. No acute displaced skull fractures are identified. No acute intracranial abnormality. Specifically, no evidence of acute post-traumatic intracranial hemorrhage, no definite regions of acute/subacute cerebral ischemia, no focal mass, mass effect, hydrocephalus or abnormal intra or extra-axial fluid collections. The visualized paranasal sinuses and mastoids are well pneumatized. CT CERVICAL SPINE FINDINGS No acute displaced fractures of the cervical spine. Alignment is anatomic. Prevertebral soft tissues are normal. Visualized portions of the upper thorax are unremarkable. IMPRESSION: 1. Right frontal scalp hematoma/laceration. 2. No acute displaced skull fracture or findings to suggest significant acute traumatic injury to the brain. 3. No evidence of acute traumatic injury to the cervical spine. 4. The appearance of the brain is normal. Electronically Signed   By: Trudie Reed M.D.   On: 05/16/2015 21:49   Ct Cervical Spine Wo Contrast  05/16/2015  CLINICAL DATA:  35 year old female found down and head with blood on head. History of alcohol abuse. EXAM: CT HEAD WITHOUT CONTRAST CT CERVICAL SPINE WITHOUT CONTRAST TECHNIQUE: Multidetector CT imaging of the head and cervical spine was performed following the standard protocol without intravenous contrast. Multiplanar CT image reconstructions of the cervical spine were also  generated. COMPARISON:   Head CT and cervical spine CT 02/17/2015. FINDINGS: CT HEAD FINDINGS Right frontal scalp high attenuation fluid compatible with a scalp hematoma and probable laceration. No acute displaced skull fractures are identified. No acute intracranial abnormality. Specifically, no evidence of acute post-traumatic intracranial hemorrhage, no definite regions of acute/subacute cerebral ischemia, no focal mass, mass effect, hydrocephalus or abnormal intra or extra-axial fluid collections. The visualized paranasal sinuses and mastoids are well pneumatized. CT CERVICAL SPINE FINDINGS No acute displaced fractures of the cervical spine. Alignment is anatomic. Prevertebral soft tissues are normal. Visualized portions of the upper thorax are unremarkable. IMPRESSION: 1. Right frontal scalp hematoma/laceration. 2. No acute displaced skull fracture or findings to suggest significant acute traumatic injury to the brain. 3. No evidence of acute traumatic injury to the cervical spine. 4. The appearance of the brain is normal. Electronically Signed   By: Trudie Reed M.D.   On: 05/16/2015 21:49        Discharge Exam: Filed Vitals:   06/05/15 2048 06/06/15 0633  BP: 102/62 92/60  Pulse: 71 77  Temp: 97.7 F (36.5 C) 97.7 F (36.5 C)  Resp: 16 16   Filed Vitals:   06/05/15 0617 06/05/15 1421 06/05/15 2048 06/06/15 0633  BP: 98/66 99/63 102/62 92/60  Pulse: 71 78 71 77  Temp: 97.7 F (36.5 C) 98.5 F (36.9 C) 97.7 F (36.5 C) 97.7 F (36.5 C)  TempSrc: Oral Oral Oral Oral  Resp: 16 18 16 16   Height:      Weight:      SpO2: 98% 100% 98% 99%    General: Pt is alert, awake, not in acute distress Cardiovascular: RRR, S1/S2 +, no rubs, no gallops Respiratory: CTA bilaterally, no wheezing, no rhonchi Abdominal: Soft, NT, ND, bowel sounds + Extremities: no edema, no cyanosis  Discharge Instructions     Medication List    STOP taking these medications        acamprosate 333 MG tablet  Commonly known  as:  CAMPRAL     clonazePAM 1 MG tablet  Commonly known as:  KLONOPIN     prazosin 1 MG capsule  Commonly known as:  MINIPRESS     valACYclovir 500 MG tablet  Commonly known as:  VALTREX     VIIBRYD 40 MG Tabs  Generic drug:  Vilazodone HCl      TAKE these medications        busPIRone 10 MG tablet  Commonly known as:  BUSPAR  Take 10 mg by mouth 3 (three) times daily.     CAMRESE 0.15-0.03 &0.01 MG tablet  Generic drug:  Levonorgestrel-Ethinyl Estradiol  Take 1 tablet by mouth daily.     folic acid 1 MG tablet  Commonly known as:  FOLVITE  Take 1 tablet (1 mg total) by mouth daily.     gabapentin 100 MG capsule  Commonly known as:  NEURONTIN  Take 100 mg by mouth 3 (three) times daily.     hydrOXYzine 25 MG tablet  Commonly known as:  ATARAX/VISTARIL  Take 1 tablet (25 mg total) by mouth every 4 (four) hours as needed for anxiety.     ketorolac 10 MG tablet  Commonly known as:  TORADOL  Take 10 mg by mouth 2 (two) times daily as needed (pain.).     lamoTRIgine 25 MG tablet  Commonly known as:  LAMICTAL  Take 1 tablet (25 mg total) by mouth daily.     lurasidone 80 MG Tabs tablet  Commonly known as:  LATUDA  Take 1 tablet (80 mg total) by mouth at bedtime.     multivitamin with minerals Tabs tablet  Take 1 tablet by mouth daily.     naphazoline-glycerin 0.012-0.2 % Soln  Commonly known as:  CLEAR EYES  Place 1 drop into both eyes 4 (four) times daily as needed for irritation.     thiamine 100 MG tablet  Take 1 tablet (100 mg total) by mouth daily.     tiZANidine 2 MG tablet  Commonly known as:  ZANAFLEX  Take 2 mg by mouth 2 (two) times daily as needed for muscle spasms.     traZODone 50 MG tablet  Commonly known as:  DESYREL  Take 50 mg by mouth at bedtime as needed for sleep.        Allergies  Allergen Reactions  . Naltrexone Other (See Comments)    Makes her hurt everywhere. Anything higher than 25mg  gives her the issues.. Can take in smaller  doses  . Baclofen Rash      The results of significant diagnostics from this hospitalization (including imaging, microbiology, ancillary and laboratory) are listed below for reference.     Microbiology: Recent Results (from the past 240 hour(s))  MRSA PCR Screening     Status: None   Collection Time: 05/29/15 11:59 PM  Result Value Ref Range Status   MRSA by PCR NEGATIVE NEGATIVE Final    Comment:        The GeneXpert MRSA Assay (FDA approved for NASAL specimens only), is one component of a comprehensive MRSA colonization surveillance program. It is not intended to diagnose MRSA infection nor to guide or monitor treatment for MRSA infections.      Labs: BNP (last 3 results) No results for input(s): BNP in the last 8760 hours. Basic Metabolic Panel:  Recent Labs Lab 06/01/15 0305 06/03/15 0521  NA 140 140  K 3.7 3.9  CL 107 105  CO2 25 26  GLUCOSE 100* 103*  BUN 9 19  CREATININE 0.51 0.67  CALCIUM 8.9 9.5   Liver Function Tests:  Recent Labs Lab 06/01/15 0305  AST 371*  ALT 378*  ALKPHOS 51  BILITOT 0.5  PROT 6.8  ALBUMIN 3.7   No results for input(s): LIPASE, AMYLASE in the last 168 hours. No results for input(s): AMMONIA in the last 168 hours. CBC: No results for input(s): WBC, NEUTROABS, HGB, HCT, MCV, PLT in the last 168 hours. Cardiac Enzymes: No results for input(s): CKTOTAL, CKMB, CKMBINDEX, TROPONINI in the last 168 hours. BNP: Invalid input(s): POCBNP CBG: No results for input(s): GLUCAP in the last 168 hours. D-Dimer No results for input(s): DDIMER in the last 72 hours. Hgb A1c No results for input(s): HGBA1C in the last 72 hours. Lipid Profile No results for input(s): CHOL, HDL, LDLCALC, TRIG, CHOLHDL, LDLDIRECT in the last 72 hours. Thyroid function studies No results for input(s): TSH, T4TOTAL, T3FREE, THYROIDAB in the last 72 hours.  Invalid input(s): FREET3 Anemia work up No results for input(s): VITAMINB12, FOLATE,  FERRITIN, TIBC, IRON, RETICCTPCT in the last 72 hours. Urinalysis    Component Value Date/Time   COLORURINE YELLOW 05/29/2015 1932   APPEARANCEUR CLOUDY* 05/29/2015 1932   LABSPEC 1.013 05/29/2015 1932   PHURINE 5.0 05/29/2015 1932   GLUCOSEU NEGATIVE 05/29/2015 1932   HGBUR SMALL* 05/29/2015 1932   BILIRUBINUR NEGATIVE 05/29/2015 1932   KETONESUR 40* 05/29/2015 1932   PROTEINUR 100* 05/29/2015 1932   UROBILINOGEN 0.2 12/31/2013 2136  NITRITE NEGATIVE 05/29/2015 1932   LEUKOCYTESUR NEGATIVE 05/29/2015 1932   Sepsis Labs Invalid input(s): PROCALCITONIN,  WBC,  LACTICIDVEN Microbiology Recent Results (from the past 240 hour(s))  MRSA PCR Screening     Status: None   Collection Time: 05/29/15 11:59 PM  Result Value Ref Range Status   MRSA by PCR NEGATIVE NEGATIVE Final    Comment:        The GeneXpert MRSA Assay (FDA approved for NASAL specimens only), is one component of a comprehensive MRSA colonization surveillance program. It is not intended to diagnose MRSA infection nor to guide or monitor treatment for MRSA infections.        SIGNED: Time coordinating discharge: Over 30 minutes  Doctor Sheahan, MD  [facility] 06/06/2015, 9:36 AM Pager   If 7PM-7AM, please contact night-coverage [email or web address] Password [email or web address]

## 2015-06-06 NOTE — Progress Notes (Signed)
Patient is holding for discharge as [facility] reports her bed has been given up as [facility] had walk ins last night and they are first priority per [facility] report.  Bed available on Thursday at 9:30 am. [facility] and patient also informed. Sheriff will schedule to pick up patient on Tuesday. LCSW updated RN regarding plan.  LCSW completed note for court for patient and gave to patient. Patient has friend in room providing support and also made aware with patient consent. Remains on IVC. Will DC on 06/07/15.  [name] EmoryHannah Trinty Marken LCSW, MSW Clinical Social Work: [facility] [employer] [phone]

## 2015-06-07 NOTE — Progress Notes (Signed)
[facility]. Pt belongings provided. Justin Mendaudle, Shaurya Rawdon H, RN

## 2015-06-18 ENCOUNTER — Emergency Department (HOSPITAL_COMMUNITY)
Admission: EM | Admit: 2015-06-18 | Discharge: 2015-06-19 | Disposition: A | Discharge status: Home / Self Care | Payer: BLUE CROSS/BLUE SHIELD | Attending: Emergency Medicine | Admitting: Emergency Medicine

## 2015-06-18 ENCOUNTER — Encounter (HOSPITAL_COMMUNITY): Payer: Self-pay | Admitting: *Deleted

## 2015-06-18 DIAGNOSIS — F319 Bipolar disorder, unspecified: Secondary | ICD-10-CM | POA: Diagnosis not present

## 2015-06-18 DIAGNOSIS — F1090 Alcohol use, unspecified, uncomplicated: Secondary | ICD-10-CM

## 2015-06-18 DIAGNOSIS — F10129 Alcohol abuse with intoxication, unspecified: Secondary | ICD-10-CM | POA: Diagnosis present

## 2015-06-18 DIAGNOSIS — F4310 Post-traumatic stress disorder, unspecified: Secondary | ICD-10-CM | POA: Insufficient documentation

## 2015-06-18 DIAGNOSIS — Z7289 Other problems related to lifestyle: Secondary | ICD-10-CM

## 2015-06-18 LAB — I-STAT CHEM 8, ED
BUN: 6 mg/dL (ref 6–20)
Calcium, Ion: 1.01 mmol/L — ABNORMAL LOW (ref 1.12–1.23)
Chloride: 105 mmol/L (ref 101–111)
Creatinine, Ser: 1.1 mg/dL — ABNORMAL HIGH (ref 0.44–1.00)
Glucose, Bld: 79 mg/dL (ref 65–99)
HEMATOCRIT: 45 % (ref 36.0–46.0)
HEMOGLOBIN: 15.3 g/dL — AB (ref 12.0–15.0)
POTASSIUM: 3.9 mmol/L (ref 3.5–5.1)
SODIUM: 142 mmol/L (ref 135–145)
TCO2: 20 mmol/L (ref 0–100)

## 2015-06-18 LAB — ETHANOL: ALCOHOL ETHYL (B): 368 mg/dL — AB (ref <5)

## 2015-06-18 NOTE — ED Notes (Signed)
Per EMS, pt from home, pt's mom from [place] could not get in touch with pt via phone, called 911.  Mom also called pt's friend, who met EMS at pt's home and opened the door for them.  Pt was in her room asleep.  Responds to painful stimuli.  Pt is confused.  [place]

## 2015-06-18 NOTE — ED Notes (Signed)
MD notified that pt is requesting for discharge due to engagements for tomorrow.

## 2015-06-18 NOTE — ED Notes (Signed)
Pt laying in bed eyes closed equal chest rise and fall no acute distress. Pt has been unsuccessful in getting in contact with roommate. Per MD room mate is to speak with MD before pt can be discharged.

## 2015-06-18 NOTE — ED Provider Notes (Signed)
35yo female with history of alcohol dependence, depression, anxiety, PTSD presents with concern for alcohol intoxication in setting of getting out of rehab yesterday. Please see Dr. Garlan FillersKnott's note for full H/P.   Patient [facility]  She was found intoxicated today by a neighbor.  Patient observed in the ED until clinically sober.  Patient eating, walking with steady gait and exhibiting clear thought process. Reports she has to fill out paperwork tomorrow for a new job and has therapist appointment this week. Denies SI/HI.  While patient does have etoh addiction, I do not believe she meets criteria at this time for IVC.  Her roommate came to pick her up from the emergency department.  Patient discharged in stable condition with understanding of reasons to return.   [name] MondayErin Semya Klinke, MD 06/19/15 1316

## 2015-06-18 NOTE — ED Notes (Signed)
PT father called regarding pt care and wanting pt to not to be released due to having just been released from mental health institution under IVC. Pt father concerned that pt is a danger to self due to alcohol consumption.

## 2015-06-18 NOTE — ED Notes (Signed)
Pt ambulated to restroom. 

## 2015-06-18 NOTE — ED Provider Notes (Signed)
CSN: [id number]     Arrival date & time 06/18/15  1425 History   First MD Initiated Contact with Patient 06/18/15 1505     Chief Complaint  Patient presents with  . Alcohol Intoxication     (Consider location/radiation/quality/duration/timing/severity/associated sxs/prior Treatment) Patient is a 35 y.o. female presenting with intoxication. The history is provided by the patient and the EMS personnel.  Alcohol Intoxication This is a recurrent problem. The current episode started 12 to 24 hours ago. The problem occurs constantly. The problem has not changed since onset.Pertinent negatives include no chest pain and no abdominal pain. Nothing aggravates the symptoms. Nothing relieves the symptoms. She has tried nothing for the symptoms.    Past Medical History  Diagnosis Date  . Alcoholic (HCC)   . Anxiety   . Post traumatic stress disorder (PTSD)   . Alcohol related seizure (HCC)   . Bipolar disorder (HCC)   . Tension headache, chronic     "monthly" (01/31/2015)  . Depression    Past Surgical History  Procedure Laterality Date  . Osteochondroma excision Right ~ 1996    "knee"  . Excisional hemorrhoidectomy  20111  . Refractive surgery Bilateral ~ 2011   Family History  Problem Relation Age of Onset  . Anxiety disorder Mother   . Anxiety disorder Sister   . Anxiety disorder Maternal Grandmother   . Anxiety disorder Father   . Alcohol abuse Father   . Alcohol abuse Brother   . Alcohol abuse Paternal Uncle    Social History  Substance Use Topics  . Smoking status: Never Smoker   . Smokeless tobacco: Never Used  . Alcohol Use: Yes     Comment: Wine and Vodka. Last drink: Today   OB History    No data available     Review of Systems  Cardiovascular: Negative for chest pain.  Gastrointestinal: Negative for abdominal pain.  All other systems reviewed and are negative.     Allergies  Naltrexone and Baclofen  Home Medications   Prior to Admission medications    Medication Sig Start Date End Date Taking? Authorizing Provider  busPIRone (BUSPAR) 10 MG tablet Take 10 mg by mouth 3 (three) times daily.    Historical Provider, MD  folic acid (FOLVITE) 1 MG tablet Take 1 tablet (1 mg total) by mouth daily. Patient not taking: Reported on 05/28/2015 02/19/15   Iris Pert Rivet, MD  gabapentin (NEURONTIN) 100 MG capsule Take 100 mg by mouth 3 (three) times daily.    Historical Provider, MD  hydrOXYzine (ATARAX/VISTARIL) 25 MG tablet Take 1 tablet (25 mg total) by mouth every 4 (four) hours as needed for anxiety. 02/08/15   Adonis Brook, NP  ketorolac (TORADOL) 10 MG tablet Take 10 mg by mouth 2 (two) times daily as needed (pain.).    Historical Provider, MD  lamoTRIgine (LAMICTAL) 25 MG tablet Take 1 tablet (25 mg total) by mouth daily. Patient taking differently: Take 50 mg by mouth every morning.  02/08/15   Adonis Brook, NP  Levonorgestrel-Ethinyl Estradiol (CAMRESE) 0.15-0.03 &0.01 MG tablet Take 1 tablet by mouth daily.    Historical Provider, MD  lurasidone (LATUDA) 80 MG TABS tablet Take 1 tablet (80 mg total) by mouth at bedtime. 02/08/15   Adonis Brook, NP  Multiple Vitamin (MULTIVITAMIN WITH MINERALS) TABS tablet Take 1 tablet by mouth daily. 02/19/15   Su Hoff, MD  naphazoline-glycerin (CLEAR EYES) 0.012-0.2 % SOLN Place 1 drop into both eyes 4 (four) times daily as  needed for irritation.    Historical Provider, MD  thiamine 100 MG tablet Take 1 tablet (100 mg total) by mouth daily. Patient not taking: Reported on 05/28/2015 02/19/15   Iris Pertarly J Rivet, MD  tiZANidine (ZANAFLEX) 2 MG tablet Take 2 mg by mouth 2 (two) times daily as needed for muscle spasms.    Historical Provider, MD  traZODone (DESYREL) 50 MG tablet Take 50 mg by mouth at bedtime as needed for sleep.    Historical Provider, MD   BP 111/79 mmHg  Pulse 98  Temp(Src) 98.1 F (36.7 C)  Resp 18  SpO2 96% Physical Exam  Constitutional: She is oriented to person, place, and time. She  appears well-developed and well-nourished. No distress.  HENT:  Head: Normocephalic.  Eyes: Conjunctivae are normal.  Neck: Neck supple. No tracheal deviation present.  Cardiovascular: Normal rate and regular rhythm.   Pulmonary/Chest: Effort normal. No respiratory distress.  Abdominal: Soft. She exhibits no distension.  Neurological: She is alert and oriented to person, place, and time. No cranial nerve deficit. GCS eye subscore is 4. GCS verbal subscore is 5. GCS motor subscore is 6.  Skin: Skin is warm and dry.  Psychiatric: Her affect is blunt. Her speech is slurred. She is slowed. She exhibits a depressed mood.  intoxicated    ED Course  Procedures (including critical care time) Labs Review Labs Reviewed  ETHANOL - Abnormal; Notable for the following:    Alcohol, Ethyl (B) 368 (*)    All other components within normal limits  I-STAT CHEM 8, ED - Abnormal; Notable for the following:    Creatinine, Ser 1.10 (*)    Calcium, Ion 1.01 (*)    Hemoglobin 15.3 (*)    All other components within normal limits    Imaging Review No results found. I have personally reviewed and evaluated these images and lab results as part of my medical decision-making.   EKG Interpretation None      MDM   Final diagnoses:  Alcohol intake above recommended sensible limits [facility](HCC)    35 y.o. female presents with slurred speech after being discharged from rehab facility for treatment of alcohol abuse and taking a latuda, then drinking heavily and not answering her phone. This prompted EMS to be called who found her acutely intoxicated. She is clinically intoxicated on arrival but does not endorse any thoughts of self-harm only increased stress that lead to relapse. Will need to metabolize alcohol to a safe level prior to discharge after reassessment. Signed out to oncoming provider to achieve clinical sobriety.     Lyndal Pulleyaniel Kitana Gage, MD 06/18/15 [phone]

## 2015-06-18 NOTE — ED Notes (Signed)
Primary RN made aware of patient's alcohol level

## 2015-06-18 NOTE — ED Notes (Addendum)
Pt reports she took latuda last night and drank "a part" of one bottle of wine last night.  Was not answering her phone when her mother from [place] was calling to check on her, so she called 911 and called pt's friend to check on her.  EMS met pt's friend at her home.  Pt was in her bed asleep, very lethargic.  Only responded to painful stimuli.  At present, pt responds to verbal stimuli.  Denies SI/HI.  Pt reports she just wanted to feel better and take the stress away.  Pt also reports R arm pain which is not new.  Pt reports she was just discharged from [facility] yesterday.

## 2015-06-18 NOTE — ED Notes (Signed)
Bed: [facility]ALA Expected date:  Expected time:  Means of arrival:  Comments: 

## 2015-06-18 NOTE — ED Notes (Signed)
MD notified of concern of pt safety from father.

## 2015-06-19 NOTE — Discharge Instructions (Signed)
Self-Destructive Behavior °Self-destructive behavior includes attitudes and behaviors that can harm, injure, or be destructive to the person who has or performs them. Self-destructive behavior is often used as a coping strategy to deal with painful emotions and stress. It is often habit-forming and difficult to control without help. Self-destructive behavior can result in serious injury or death.  °EXAMPLES OF SELF-DESTRUCTIVE BEHAVIOR  °· Hurting yourself (self-injury). This includes cutting, scratching, burning, or otherwise injuring yourself to release tension or lessen emotional pain. °· Binge eating and other eating disorders. °· Excessive drinking. °· Drug abuse. °· Shopping sprees, reckless spending, or gambling that causes you to go into debt. °· Any type of addictive behavior. This includes gambling, shoplifting, and other behaviors. °· Not setting healthy limits. This may include depriving yourself of proper rest and nutrition in order to meet the demands of others. °· Intense or chronic feelings of anxiety, anger, impulsiveness, unworthiness, hopelessness, or loss. °WHAT CAUSES SELF-DESTRUCTIVE BEHAVIOR? °The underlying causes of self-destructive behavior are personal and may include: °· Difficulty managing stress. °· Trauma or abuse (including in past life events). °· Other mental health issues, such as clinical depression and low self-esteem. °WHAT SHOULD I DO IF I WANT TO HURT MYSELF?  °· See your health care provider or counselor. He or she will help you recognize the source of your problems, sort out your feelings, and get the help you need. °· Avoid alcohol and drugs. °· Try distracting yourself with other activities (such as chewing gum, exercising, cleaning, or snapping rubber bands) until you are calm and can seek help. °· Learn how to deal with stress in healthy, positive ways (such as with relaxation techniques or exercise). °· Learn to identify and avoid the things that trigger your  self-destructive behavior. °· Get involved in a local support group. °SEEK MEDICAL CARE IF:  °· You are experiencing suicidal thoughts. °· You experience illness or injury as a result of self-destructive behavior. °SEEK IMMEDIATE MEDICAL CARE IF:  °Your behavior is out of control and your life is in danger. Call: °· The police.   °· Your health care provider. °· Local emergency services (911 in [place] °MAKE SURE YOU: °· Understand these instructions. °· Will watch your condition. °· Will get help right away if you are not doing well or get worse. °  °This information is not intended to replace advice given to you by your health care provider. Make sure you discuss any questions you have with your health care provider. °  °Document Released: 02/21/2004 Document Revised: 09/15/2012 Document Reviewed: 06/01/2012 °Elsevier Interactive Patient Education ©2016 [employer] Inc. ° °

## 2015-06-19 NOTE — ED Notes (Signed)
Pt and friend reports understanding of discharge information. No questions at time of discharge. Pt discharged in care of friend after discussing discharge information by MD.

## 2015-06-19 NOTE — ED Notes (Signed)
MD at bedside to discuss plan of care with pt.

## 2015-06-20 ENCOUNTER — Emergency Department (HOSPITAL_COMMUNITY)
Admission: EM | Admit: 2015-06-20 | Discharge: 2015-06-21 | Disposition: A | Discharge status: Home / Self Care | Payer: BLUE CROSS/BLUE SHIELD | Attending: Emergency Medicine | Admitting: Emergency Medicine

## 2015-06-20 ENCOUNTER — Encounter (HOSPITAL_COMMUNITY): Payer: Self-pay | Admitting: Emergency Medicine

## 2015-06-20 DIAGNOSIS — F10129 Alcohol abuse with intoxication, unspecified: Secondary | ICD-10-CM | POA: Insufficient documentation

## 2015-06-20 DIAGNOSIS — F10929 Alcohol use, unspecified with intoxication, unspecified: Secondary | ICD-10-CM

## 2015-06-20 DIAGNOSIS — Z79899 Other long term (current) drug therapy: Secondary | ICD-10-CM | POA: Diagnosis not present

## 2015-06-20 DIAGNOSIS — F319 Bipolar disorder, unspecified: Secondary | ICD-10-CM | POA: Insufficient documentation

## 2015-06-20 DIAGNOSIS — R1084 Generalized abdominal pain: Secondary | ICD-10-CM | POA: Insufficient documentation

## 2015-06-20 LAB — COMPREHENSIVE METABOLIC PANEL
ALK PHOS: 39 U/L (ref 38–126)
ALT: 122 U/L — AB (ref 14–54)
AST: 68 U/L — AB (ref 15–41)
Albumin: 4.8 g/dL (ref 3.5–5.0)
Anion gap: 22 — ABNORMAL HIGH (ref 5–15)
BUN: 10 mg/dL (ref 6–20)
CALCIUM: 8.8 mg/dL — AB (ref 8.9–10.3)
CHLORIDE: 101 mmol/L (ref 101–111)
CO2: 19 mmol/L — AB (ref 22–32)
CREATININE: 0.58 mg/dL (ref 0.44–1.00)
GFR calc Af Amer: >60 mL/min (ref >60)
GFR calc non Af Amer: >60 mL/min (ref >60)
GLUCOSE: 75 mg/dL (ref 65–99)
Potassium: 3.8 mmol/L (ref 3.5–5.1)
SODIUM: 142 mmol/L (ref 135–145)
Total Bilirubin: 0.6 mg/dL (ref 0.3–1.2)
Total Protein: 8.1 g/dL (ref 6.5–8.1)

## 2015-06-20 LAB — CBC
HEMATOCRIT: 35.4 % — AB (ref 36.0–46.0)
Hemoglobin: 12 g/dL (ref 12.0–15.0)
MCH: 29.6 pg (ref 26.0–34.0)
MCHC: 33.9 g/dL (ref 30.0–36.0)
MCV: 87.2 fL (ref 78.0–100.0)
PLATELETS: 390 10*3/uL (ref 150–400)
RBC: 4.06 MIL/uL (ref 3.87–5.11)
RDW: 14.4 % (ref 11.5–15.5)
WBC: 7.9 10*3/uL (ref 4.0–10.5)

## 2015-06-20 LAB — RAPID URINE DRUG SCREEN, HOSP PERFORMED
Amphetamines: NOT DETECTED
BARBITURATES: NOT DETECTED
BENZODIAZEPINES: NOT DETECTED
Cocaine: NOT DETECTED
Opiates: NOT DETECTED
Tetrahydrocannabinol: NOT DETECTED

## 2015-06-20 LAB — ETHANOL: Alcohol, Ethyl (B): 446 mg/dL (ref <5)

## 2015-06-20 LAB — SALICYLATE LEVEL

## 2015-06-20 LAB — ACETAMINOPHEN LEVEL

## 2015-06-20 NOTE — ED Provider Notes (Signed)
CSN: [id number]     Arrival date & time 06/20/15  2149 History   First MD Initiated Contact with Patient 06/20/15 2223     Chief Complaint  Patient presents with  . Alcohol Intoxication     (Consider location/radiation/quality/duration/timing/severity/associated sxs/prior Treatment) HPI   [name] Stewart is a 35 y.o. female who presents for evaluation of altered mental status with suspected alcohol intoxication and possible gabapentin ingestion. No one is with the patient currently.  Level V caveat- altered mental status   Past Medical History  Diagnosis Date  . Alcoholic (HCC)   . Anxiety   . Post traumatic stress disorder (PTSD)   . Alcohol related seizure (HCC)   . Bipolar disorder (HCC)   . Tension headache, chronic     "monthly" (01/31/2015)  . Depression    Past Surgical History  Procedure Laterality Date  . Osteochondroma excision Right ~ 1996    "knee"  . Excisional hemorrhoidectomy  20111  . Refractive surgery Bilateral ~ 2011   Family History  Problem Relation Age of Onset  . Anxiety disorder Mother   . Anxiety disorder Sister   . Anxiety disorder Maternal Grandmother   . Anxiety disorder Father   . Alcohol abuse Father   . Alcohol abuse Brother   . Alcohol abuse Paternal Uncle    Social History  Substance Use Topics  . Smoking status: Never Smoker   . Smokeless tobacco: Never Used  . Alcohol Use: Yes     Comment: Wine and Vodka. Last drink: Today   OB History    No data available     Review of Systems  Unable to perform ROS: Mental status change      Allergies  Naltrexone and Baclofen  Home Medications   Prior to Admission medications   Medication Sig Start Date End Date Taking? Authorizing Provider  busPIRone (BUSPAR) 10 MG tablet Take 10 mg by mouth 3 (three) times daily.    Historical Provider, MD  folic acid (FOLVITE) 1 MG tablet Take 1 tablet (1 mg total) by mouth daily. 02/19/15   Carly Arlyce Harman, MD  gabapentin (NEURONTIN) 100 MG capsule  Take 100 mg by mouth 3 (three) times daily.    Historical Provider, MD  hydrOXYzine (ATARAX/VISTARIL) 25 MG tablet Take 1 tablet (25 mg total) by mouth every 4 (four) hours as needed for anxiety. 02/08/15   Adonis Brook, NP  ketorolac (TORADOL) 10 MG tablet Take 10 mg by mouth 2 (two) times daily as needed (pain.).    Historical Provider, MD  lamoTRIgine (LAMICTAL) 25 MG tablet Take 1 tablet (25 mg total) by mouth daily. Patient taking differently: Take 50 mg by mouth every morning.  02/08/15   Adonis Brook, NP  Levonorgestrel-Ethinyl Estradiol (CAMRESE) 0.15-0.03 &0.01 MG tablet Take 1 tablet by mouth daily.    Historical Provider, MD  lurasidone (LATUDA) 80 MG TABS tablet Take 1 tablet (80 mg total) by mouth at bedtime. Patient not taking: Reported on 06/18/2015 02/08/15   Adonis Brook, NP  Melatonin 3 MG TABS Take 9 mg by mouth at bedtime.    Historical Provider, MD  Multiple Vitamin (MULTIVITAMIN WITH MINERALS) TABS tablet Take 1 tablet by mouth daily. 02/19/15   Su Hoff, MD  naphazoline-glycerin (CLEAR EYES) 0.012-0.2 % SOLN Place 1 drop into both eyes 4 (four) times daily as needed for irritation.    Historical Provider, MD  thiamine 100 MG tablet Take 1 tablet (100 mg total) by mouth daily. 02/19/15  Su Hoffarly J Rivet, MD  tiZANidine (ZANAFLEX) 2 MG tablet Take 2 mg by mouth 2 (two) times daily as needed for muscle spasms.    Historical Provider, MD  traZODone (DESYREL) 50 MG tablet Take 50 mg by mouth at bedtime as needed for sleep.    Historical Provider, MD   BP 105/65 mmHg  Pulse 105  Temp(Src) 98.2 F (36.8 C) (Oral)  Resp 14  SpO2 94% Physical Exam  Constitutional: She appears well-developed. She appears distressed Financial planner(Lethargic).  Appears older than stated age.  HENT:  Head: Normocephalic and atraumatic.  Right Ear: External ear normal.  Left Ear: External ear normal.  Eyes: Conjunctivae and EOM are normal. Pupils are equal, round, and reactive to light. Right eye exhibits  no discharge. Left eye exhibits no discharge.  Neck: Normal range of motion and phonation normal. Neck supple.  Cardiovascular: Regular rhythm and normal heart sounds.   Tachycardic  Pulmonary/Chest: Effort normal and breath sounds normal. She exhibits no bony tenderness.  Abdominal: Soft. There is tenderness (Diffuse, mild).  Musculoskeletal: Normal range of motion.  Neurological: She is alert. No cranial nerve deficit or sensory deficit. She exhibits normal muscle tone. Coordination normal.  Moderate dysarthria. No aphasia.  Skin: Skin is warm, dry and intact.  Psychiatric:  Sad appearing, detached.  Nursing note and vitals reviewed.   ED Course  Procedures (including critical care time)  Initial clinical impression- altered mental status from suspected intoxication, exact source is unclear. Poison control contacted, and we will evaluate for usual ingestants, as well as monitor  hemodynamic stability.  Medications - No data to display  Patient Vitals for the past 24 hrs:  BP Temp Temp src Pulse Resp SpO2  06/21/15 0015 105/65 mmHg - - 105 14 94 %  06/21/15 0000 108/67 mmHg - - 102 15 94 %  06/20/15 2345 108/71 mmHg - - 108 16 94 %  06/20/15 2330 113/73 mmHg - - 112 15 96 %  06/20/15 2315 108/68 mmHg - - 113 16 95 %  06/20/15 2300 117/75 mmHg - - 116 15 95 %  06/20/15 2245 134/77 mmHg - - - 18 -  06/20/15 2230 - - - (!) 122 17 97 %  06/20/15 2215 - - - (!) 123 15 100 %  06/20/15 2211 119/86 mmHg - - 118 - -  06/20/15 2200 - - - 112 18 98 %  06/20/15 2157 119/86 mmHg 98.2 F (36.8 C) Oral 115 14 96 %  06/20/15 2150 - - - - - 95 %    12:49 AM Reevaluation with update and discussion. After initial assessment and treatment, an updated evaluation reveals She is sleepy, resting comfortably. Vital signs trending is reassuring. Aldrin Engelhard L    Labs Review Labs Reviewed  COMPREHENSIVE METABOLIC PANEL - Abnormal; Notable for the following:    CO2 19 (*)    Calcium 8.8 (*)     AST 68 (*)    ALT 122 (*)    Anion gap 22 (*)    All other components within normal limits  ETHANOL - Abnormal; Notable for the following:    Alcohol, Ethyl (B) 446 (*)    All other components within normal limits  CBC - Abnormal; Notable for the following:    HCT 35.4 (*)    All other components within normal limits  ACETAMINOPHEN LEVEL - Abnormal; Notable for the following:    Acetaminophen (Tylenol), Serum <10 (*)    All other components within normal limits  URINE  RAPID DRUG SCREEN, HOSP PERFORMED  SALICYLATE LEVEL    Imaging Review No results found. I have personally reviewed and evaluated these images and lab results as part of my medical decision-making.   EKG Interpretation   Date/Time:  Wednesday Jun 20 2015 21:55:42 EDT Ventricular Rate:  119 PR Interval:  157 QRS Duration: 83 QT Interval:  308 QTC Calculation: 433 R Axis:   38 Text Interpretation:  Sinus tachycardia since last tracing no significant  change Confirmed by Zema Lizardo  MD, Wayman Hoard ([id number]) on 06/20/2015 10:24:02 PM      MDM   Final diagnoses:  Alcohol intoxication, with unspecified complication (HCC)    Acute alcohol intoxication, without clear evidence for congestion. No evidence for suicidal ideation or plan. She is obtunded from alcohol. She will require assessment after she becomes sober. He will likely require a prolonged observation in the emergency department, and tell about 0800 hrs., 06/21/15. Poison control has suggested a six-hour observation period.  Nursing Notes Reviewed/ Care Coordinated Applicable Imaging Reviewed Interpretation of Laboratory Data incorporated into ED treatment  Plan- Care to Dr. Rhunette Croft at 0100    Mancel Bale, MD 06/21/15 [phone]

## 2015-06-20 NOTE — ED Notes (Signed)
Per EMS pt presents d/t ETOH intoxication and possible ingestion of gabapentin.  EMS states that this was the second time they were called out, the first time pt refused transport.

## 2015-06-20 NOTE — ED Notes (Signed)
Bed: RESB Expected date:  Expected time:  Means of arrival:  Comments: EMS 35 yo female intoxicated and narcotics-altered mental status

## 2015-06-20 NOTE — ED Notes (Signed)
Notified [name] with  poison control.  Recommendations per poison control ~supporticve care ~protect airway ~obtain Tylenol level ~obtain salicylate level ~ LFT levels [name] stated she would call back to follow up with lab results.

## 2015-06-21 MED ORDER — LORAZEPAM 1 MG PO TABS
1.0000 mg | ORAL_TABLET | Freq: Once | ORAL | Status: AC
  Administered 2015-06-21: 1 mg via ORAL
  Filled 2015-06-21: qty 1

## 2015-06-21 NOTE — Discharge Instructions (Signed)
Alcohol Intoxication  Alcohol intoxication occurs when the amount of alcohol that a person has consumed impairs his or her ability to mentally and physically function. Alcohol directly impairs the normal chemical activity of the brain. Drinking large amounts of alcohol can lead to changes in mental function and behavior, and it can cause many physical effects that can be harmful.   Alcohol intoxication can range in severity from mild to very severe. Various factors can affect the level of intoxication that occurs, such as the person's age, gender, weight, frequency of alcohol consumption, and the presence of other medical conditions (such as diabetes, seizures, or heart conditions). Dangerous levels of alcohol intoxication may occur when people drink large amounts of alcohol in a short period (binge drinking). Alcohol can also be especially dangerous when combined with certain prescription medicines or "recreational" drugs.  SIGNS AND SYMPTOMS  Some common signs and symptoms of mild alcohol intoxication include:  · Loss of coordination.  · Changes in mood and behavior.  · Impaired judgment.  · Slurred speech.  As alcohol intoxication progresses to more severe levels, other signs and symptoms will appear. These may include:  · Vomiting.  · Confusion and impaired memory.  · Slowed breathing.  · Seizures.  · Loss of consciousness.  DIAGNOSIS   Your health care provider will take a medical history and perform a physical exam. You will be asked about the amount and type of alcohol you have consumed. Blood tests will be done to measure the concentration of alcohol in your blood. In many places, your blood alcohol level must be lower than 80 mg/dL (0.08%) to legally drive. However, many dangerous effects of alcohol can occur at much lower levels.   TREATMENT   People with alcohol intoxication often do not require treatment. Most of the effects of alcohol intoxication are temporary, and they go away as the alcohol naturally  leaves the body. Your health care provider will monitor your condition until you are stable enough to go home. Fluids are sometimes given through an IV access tube to help prevent dehydration.   HOME CARE INSTRUCTIONS  · Do not drive after drinking alcohol.  · Stay hydrated. Drink enough water and fluids to keep your urine clear or pale yellow. Avoid caffeine.    · Only take over-the-counter or prescription medicines as directed by your health care provider.    SEEK MEDICAL CARE IF:   · You have persistent vomiting.    · You do not feel better after a few days.  · You have frequent alcohol intoxication. Your health care provider can help determine if you should see a substance use treatment counselor.  SEEK IMMEDIATE MEDICAL CARE IF:   · You become shaky or tremble when you try to stop drinking.    · You shake uncontrollably (seizure).    · You throw up (vomit) blood. This may be bright red or may look like black coffee grounds.    · You have blood in your stool. This may be bright red or may appear as a black, tarry, bad smelling stool.    · You become lightheaded or faint.    MAKE SURE YOU:   · Understand these instructions.  · Will watch your condition.  · Will get help right away if you are not doing well or get worse.     This information is not intended to replace advice given to you by your health care provider. Make sure you discuss any questions you have with your health care provider.       Document Released: 10/23/2004 Document Revised: 09/15/2012 Document Reviewed: 06/18/2012  Elsevier Interactive Patient Education ©2016 [employer]

## 2015-06-21 NOTE — ED Notes (Signed)
Verbalized understanding discharge instructions and resources. In no acute distress.

## 2015-06-21 NOTE — ED Notes (Signed)
Poison Control called, Alexia Freestoneatty states closing case out

## 2015-06-21 NOTE — ED Provider Notes (Signed)
Patient is alert and oriented. She is up and ambulate about. She is using her phone to make calls. She is denying pain or distress. She reports she is ready to leave now. All movements are coordinated and purposeful. Patient is steady and her gait and motor activity. Discharge paperwork provided for alcohol intoxication acute on chronic alcohol dependence.  Arby BarretteMarcy Ryson Bacha, MD 06/21/15 [phone]

## 2015-06-21 NOTE — ED Notes (Signed)
Dr Donnald GarrePfeiffer made aware of pt HR

## 2015-08-13 ENCOUNTER — Inpatient Hospital Stay (HOSPITAL_COMMUNITY)
Admission: EM | Admit: 2015-08-13 | Discharge: 2015-08-14 | DRG: 896 | Disposition: A | Discharge status: Home / Self Care | Payer: BLUE CROSS/BLUE SHIELD | Attending: Internal Medicine | Admitting: Internal Medicine

## 2015-08-13 ENCOUNTER — Encounter (HOSPITAL_COMMUNITY): Payer: Self-pay

## 2015-08-13 DIAGNOSIS — R000 Tachycardia, unspecified: Secondary | ICD-10-CM | POA: Diagnosis present

## 2015-08-13 DIAGNOSIS — Z811 Family history of alcohol abuse and dependence: Secondary | ICD-10-CM | POA: Diagnosis not present

## 2015-08-13 DIAGNOSIS — I10 Essential (primary) hypertension: Secondary | ICD-10-CM | POA: Diagnosis present

## 2015-08-13 DIAGNOSIS — R740 Nonspecific elevation of levels of transaminase and lactic acid dehydrogenase [LDH]: Secondary | ICD-10-CM | POA: Diagnosis present

## 2015-08-13 DIAGNOSIS — Y906 Blood alcohol level of 120-199 mg/100 ml: Secondary | ICD-10-CM | POA: Diagnosis present

## 2015-08-13 DIAGNOSIS — F4310 Post-traumatic stress disorder, unspecified: Secondary | ICD-10-CM | POA: Diagnosis present

## 2015-08-13 DIAGNOSIS — E876 Hypokalemia: Secondary | ICD-10-CM | POA: Diagnosis present

## 2015-08-13 DIAGNOSIS — F10939 Alcohol use, unspecified with withdrawal, unspecified: Secondary | ICD-10-CM

## 2015-08-13 DIAGNOSIS — F419 Anxiety disorder, unspecified: Secondary | ICD-10-CM | POA: Diagnosis present

## 2015-08-13 DIAGNOSIS — Z888 Allergy status to other drugs, medicaments and biological substances status: Secondary | ICD-10-CM

## 2015-08-13 DIAGNOSIS — K226 Gastro-esophageal laceration-hemorrhage syndrome: Secondary | ICD-10-CM | POA: Diagnosis present

## 2015-08-13 DIAGNOSIS — Z79899 Other long term (current) drug therapy: Secondary | ICD-10-CM | POA: Diagnosis not present

## 2015-08-13 DIAGNOSIS — R112 Nausea with vomiting, unspecified: Secondary | ICD-10-CM | POA: Diagnosis present

## 2015-08-13 DIAGNOSIS — F3170 Bipolar disorder, currently in remission, most recent episode unspecified: Secondary | ICD-10-CM | POA: Diagnosis present

## 2015-08-13 DIAGNOSIS — R7401 Elevation of levels of liver transaminase levels: Secondary | ICD-10-CM | POA: Diagnosis present

## 2015-08-13 DIAGNOSIS — F10230 Alcohol dependence with withdrawal, uncomplicated: Secondary | ICD-10-CM | POA: Diagnosis not present

## 2015-08-13 DIAGNOSIS — Z818 Family history of other mental and behavioral disorders: Secondary | ICD-10-CM | POA: Diagnosis not present

## 2015-08-13 DIAGNOSIS — F10239 Alcohol dependence with withdrawal, unspecified: Principal | ICD-10-CM | POA: Diagnosis present

## 2015-08-13 DIAGNOSIS — E860 Dehydration: Secondary | ICD-10-CM | POA: Diagnosis present

## 2015-08-13 DIAGNOSIS — F10929 Alcohol use, unspecified with intoxication, unspecified: Secondary | ICD-10-CM | POA: Diagnosis present

## 2015-08-13 LAB — URINE MICROSCOPIC-ADD ON

## 2015-08-13 LAB — URINALYSIS, ROUTINE W REFLEX MICROSCOPIC
BILIRUBIN URINE: NEGATIVE
Glucose, UA: NEGATIVE mg/dL
Ketones, ur: >80 mg/dL — AB
Leukocytes, UA: NEGATIVE
Nitrite: NEGATIVE
Protein, ur: >300 mg/dL — AB
SPECIFIC GRAVITY, URINE: 1.021 (ref 1.005–1.030)
pH: 5 (ref 5.0–8.0)

## 2015-08-13 LAB — ETHANOL: Alcohol, Ethyl (B): 175 mg/dL — ABNORMAL HIGH (ref <5)

## 2015-08-13 LAB — COMPREHENSIVE METABOLIC PANEL
ALBUMIN: 3.8 g/dL (ref 3.5–5.0)
ALK PHOS: 31 U/L — AB (ref 38–126)
ALT: 98 U/L — ABNORMAL HIGH (ref 14–54)
ANION GAP: 19 — AB (ref 5–15)
AST: 63 U/L — ABNORMAL HIGH (ref 15–41)
BILIRUBIN TOTAL: 0.7 mg/dL (ref 0.3–1.2)
BUN: 11 mg/dL (ref 6–20)
CALCIUM: 7.5 mg/dL — AB (ref 8.9–10.3)
CO2: 18 mmol/L — ABNORMAL LOW (ref 22–32)
Chloride: 98 mmol/L — ABNORMAL LOW (ref 101–111)
Creatinine, Ser: 0.48 mg/dL (ref 0.44–1.00)
GLUCOSE: 74 mg/dL (ref 65–99)
Potassium: 3.9 mmol/L (ref 3.5–5.1)
Sodium: 135 mmol/L (ref 135–145)
TOTAL PROTEIN: 7 g/dL (ref 6.5–8.1)

## 2015-08-13 LAB — CBC WITH DIFFERENTIAL/PLATELET
Basophils Absolute: 0 10*3/uL (ref 0.0–0.1)
Basophils Relative: 0 %
Eosinophils Absolute: 0 10*3/uL (ref 0.0–0.7)
Eosinophils Relative: 0 %
HEMATOCRIT: 39.1 % (ref 36.0–46.0)
HEMOGLOBIN: 13.4 g/dL (ref 12.0–15.0)
LYMPHS ABS: 1.9 10*3/uL (ref 0.7–4.0)
Lymphocytes Relative: 19 %
MCH: 30.7 pg (ref 26.0–34.0)
MCHC: 34.3 g/dL (ref 30.0–36.0)
MCV: 89.5 fL (ref 78.0–100.0)
MONOS PCT: 3 %
Monocytes Absolute: 0.3 10*3/uL (ref 0.1–1.0)
NEUTROS ABS: 7.8 10*3/uL — AB (ref 1.7–7.7)
NEUTROS PCT: 78 %
Platelets: 256 10*3/uL (ref 150–400)
RBC: 4.37 MIL/uL (ref 3.87–5.11)
RDW: 14.9 % (ref 11.5–15.5)
WBC: 10 10*3/uL (ref 4.0–10.5)

## 2015-08-13 LAB — HCG, QUANTITATIVE, PREGNANCY: hCG, Beta Chain, Quant, S: <1 m[IU]/mL (ref <5)

## 2015-08-13 LAB — LIPASE, BLOOD: Lipase: 36 U/L (ref 11–51)

## 2015-08-13 LAB — MRSA PCR SCREENING: MRSA by PCR: NEGATIVE

## 2015-08-13 LAB — PREGNANCY, URINE: PREG TEST UR: NEGATIVE

## 2015-08-13 MED ORDER — VITAMIN B-1 100 MG PO TABS
100.0000 mg | ORAL_TABLET | Freq: Every day | ORAL | Status: DC
Start: 2015-08-13 — End: 2015-08-14
  Administered 2015-08-13 – 2015-08-14 (×2): 100 mg via ORAL
  Filled 2015-08-13 (×2): qty 1

## 2015-08-13 MED ORDER — SODIUM CHLORIDE 0.9 % IV SOLN
INTRAVENOUS | Status: DC
  Administered 2015-08-13: 18:00:00 via INTRAVENOUS

## 2015-08-13 MED ORDER — VALACYCLOVIR HCL 500 MG PO TABS
1000.0000 mg | ORAL_TABLET | Freq: Every day | ORAL | Status: DC
  Administered 2015-08-13: 1000 mg via ORAL
  Filled 2015-08-13 (×2): qty 2

## 2015-08-13 MED ORDER — THIAMINE HCL 100 MG/ML IJ SOLN: 100.0000 mg | Freq: Every day | INTRAMUSCULAR | Status: DC

## 2015-08-13 MED ORDER — LORAZEPAM 1 MG PO TABS: 1.0000 mg | ORAL_TABLET | Freq: Four times a day (QID) | ORAL | Status: DC | PRN

## 2015-08-13 MED ORDER — PANTOPRAZOLE SODIUM 40 MG PO TBEC
40.0000 mg | DELAYED_RELEASE_TABLET | Freq: Two times a day (BID) | ORAL | Status: DC
  Administered 2015-08-13 – 2015-08-14 (×3): 40 mg via ORAL
  Filled 2015-08-13 (×3): qty 1

## 2015-08-13 MED ORDER — ALUM & MAG HYDROXIDE-SIMETH 200-200-20 MG/5ML PO SUSP
30.0000 mL | Freq: Three times a day (TID) | ORAL | Status: AC
  Administered 2015-08-13 – 2015-08-14 (×3): 30 mL via ORAL
  Filled 2015-08-13 (×3): qty 30

## 2015-08-13 MED ORDER — ONDANSETRON HCL 4 MG/2ML IJ SOLN
4.0000 mg | Freq: Four times a day (QID) | INTRAMUSCULAR | Status: DC | PRN
  Filled 2015-08-13: qty 2

## 2015-08-13 MED ORDER — DIPHENHYDRAMINE HCL 50 MG/ML IJ SOLN
12.5000 mg | Freq: Once | INTRAMUSCULAR | Status: AC
  Administered 2015-08-13: 12.5 mg via INTRAVENOUS
  Filled 2015-08-13: qty 1

## 2015-08-13 MED ORDER — HEPARIN SODIUM (PORCINE) 5000 UNIT/ML IJ SOLN
5000.0000 [IU] | Freq: Three times a day (TID) | INTRAMUSCULAR | Status: DC
  Administered 2015-08-13 – 2015-08-14 (×2): 5000 [IU] via SUBCUTANEOUS
  Filled 2015-08-13 (×2): qty 1

## 2015-08-13 MED ORDER — SODIUM CHLORIDE 0.9 % IV SOLN: INTRAVENOUS | Status: DC

## 2015-08-13 MED ORDER — FOLIC ACID 1 MG PO TABS
1.0000 mg | ORAL_TABLET | Freq: Every day | ORAL | Status: DC
  Administered 2015-08-13 – 2015-08-14 (×2): 1 mg via ORAL
  Filled 2015-08-13 (×2): qty 1

## 2015-08-13 MED ORDER — ONDANSETRON HCL 4 MG PO TABS: 4.0000 mg | ORAL_TABLET | Freq: Four times a day (QID) | ORAL | Status: DC | PRN

## 2015-08-13 MED ORDER — METOPROLOL TARTRATE 5 MG/5ML IV SOLN
10.0000 mg | INTRAVENOUS | Status: DC | PRN
  Administered 2015-08-13: 10 mg via INTRAVENOUS
  Filled 2015-08-13: qty 10

## 2015-08-13 MED ORDER — LORAZEPAM 2 MG/ML IJ SOLN
0.0000 mg | Freq: Two times a day (BID) | INTRAMUSCULAR | Status: DC
Start: 2015-08-15 — End: 2015-08-14

## 2015-08-13 MED ORDER — SODIUM CHLORIDE 0.9 % IV BOLUS (SEPSIS)
500.0000 mL | Freq: Once | INTRAVENOUS | Status: AC
  Administered 2015-08-13: 500 mL via INTRAVENOUS

## 2015-08-13 MED ORDER — SODIUM CHLORIDE 0.9 % IV SOLN
INTRAVENOUS | Status: DC
  Administered 2015-08-13: 15:00:00 via INTRAVENOUS

## 2015-08-13 MED ORDER — CHLORDIAZEPOXIDE HCL 10 MG PO CAPS
10.0000 mg | ORAL_CAPSULE | Freq: Three times a day (TID) | ORAL | Status: DC
  Administered 2015-08-13 – 2015-08-14 (×3): 10 mg via ORAL
  Filled 2015-08-13 (×3): qty 1

## 2015-08-13 MED ORDER — METOCLOPRAMIDE HCL 5 MG/ML IJ SOLN
10.0000 mg | Freq: Once | INTRAMUSCULAR | Status: AC
  Administered 2015-08-13: 10 mg via INTRAVENOUS
  Filled 2015-08-13: qty 2

## 2015-08-13 MED ORDER — CLONIDINE HCL 0.1 MG PO TABS
0.1000 mg | ORAL_TABLET | Freq: Two times a day (BID) | ORAL | Status: DC
  Administered 2015-08-13 – 2015-08-14 (×3): 0.1 mg via ORAL
  Filled 2015-08-13 (×3): qty 1

## 2015-08-13 MED ORDER — ADULT MULTIVITAMIN W/MINERALS CH
1.0000 | ORAL_TABLET | Freq: Every day | ORAL | Status: DC
  Administered 2015-08-13 – 2015-08-14 (×2): 1 via ORAL
  Filled 2015-08-13 (×2): qty 1

## 2015-08-13 MED ORDER — LORAZEPAM 2 MG/ML IJ SOLN
0.0000 mg | Freq: Four times a day (QID) | INTRAMUSCULAR | Status: DC
  Administered 2015-08-13 (×2): 2 mg via INTRAVENOUS
  Filled 2015-08-13: qty 1

## 2015-08-13 MED ORDER — SODIUM CHLORIDE 0.9% FLUSH
3.0000 mL | Freq: Two times a day (BID) | INTRAVENOUS | Status: DC
  Administered 2015-08-13 (×2): 3 mL via INTRAVENOUS

## 2015-08-13 MED ORDER — SODIUM CHLORIDE 0.9 % IV BOLUS (SEPSIS)
3000.0000 mL | Freq: Once | INTRAVENOUS | Status: AC
  Administered 2015-08-13: 3000 mL via INTRAVENOUS

## 2015-08-13 MED ORDER — LORAZEPAM 2 MG/ML IJ SOLN
2.0000 mg | Freq: Once | INTRAMUSCULAR | Status: AC
  Administered 2015-08-13: 2 mg via INTRAVENOUS
  Filled 2015-08-13: qty 1

## 2015-08-13 MED ORDER — LORAZEPAM 2 MG/ML IJ SOLN
2.0000 mg | Freq: Four times a day (QID) | INTRAMUSCULAR | Status: DC | PRN
  Filled 2015-08-13: qty 1

## 2015-08-13 MED ORDER — LAMOTRIGINE 150 MG PO TABS
150.0000 mg | ORAL_TABLET | Freq: Every day | ORAL | Status: DC
  Administered 2015-08-13: 150 mg via ORAL
  Filled 2015-08-13 (×2): qty 1

## 2015-08-13 MED ORDER — LURASIDONE HCL 40 MG PO TABS
40.0000 mg | ORAL_TABLET | Freq: Every day | ORAL | Status: DC
  Administered 2015-08-14: 40 mg via ORAL
  Filled 2015-08-13: qty 1

## 2015-08-13 MED ORDER — LORAZEPAM 2 MG/ML IJ SOLN
1.0000 mg | Freq: Once | INTRAMUSCULAR | Status: AC
  Administered 2015-08-13: 1 mg via INTRAVENOUS
  Filled 2015-08-13: qty 1

## 2015-08-13 MED ORDER — METOPROLOL TARTRATE 25 MG PO TABS
50.0000 mg | ORAL_TABLET | Freq: Two times a day (BID) | ORAL | Status: DC
  Administered 2015-08-13 – 2015-08-14 (×3): 50 mg via ORAL
  Filled 2015-08-13 (×3): qty 2

## 2015-08-13 MED ORDER — HYDROCODONE-ACETAMINOPHEN 5-325 MG PO TABS
1.0000 | ORAL_TABLET | ORAL | Status: DC | PRN
  Administered 2015-08-13 – 2015-08-14 (×2): 1 via ORAL
  Filled 2015-08-13 (×2): qty 1

## 2015-08-13 NOTE — ED Notes (Signed)
Fecal occult collected and resulted. No blood detected.

## 2015-08-13 NOTE — ED Notes (Signed)
Phlebotomy called to perform blood draw.

## 2015-08-13 NOTE — ED Notes (Signed)
Patient stated that she had a drink before coming to the ER

## 2015-08-13 NOTE — ED Notes (Signed)
Lab notified that blood sent was hemolyzed. ED staff was unable to get blood. MD notified. Will rehydrate using iv team placed line then attempt blood draw and resend labs if successful.

## 2015-08-13 NOTE — H&P (Signed)
TRH H&P   Patient Demographics:    [name] Stewart, is a 35 y.o. female  MRN: [id number]   DOB - 03/15/1980  Admit Date - 08/13/2015  Outpatient Primary MD for the patient is Verlon AuBoyd, Tammy Lamonica, MD  Outpatient Specialists: None    Patient coming from: Home  Chief Complaint  Patient presents with  . Emesis  . Alcohol Problem      HPI:    [name] Lollingrin Benedick  is a 35 y.o. female, History of alcohol abuse, bipolar disorder, multiple hospital admissions and he visits for alcohol-related problems comes into the hospital after a five-day history of alcoholic binging resulting in multiple episodes of nausea and vomiting this morning, she came to the hospital with nausea vomiting, scant blood in emesis x 1, Hame Occ -ve in stool in the ER. He denies any fever chills, no headache chest abdominal pain. No focal weakness. She was recently in an alcohol rehabilitation.  In the ER, workup suggestive of alcohol intoxication, Hemoccult-negative in stool, I was called to admit the patient for alcohol intoxication.    Review of systems:    In addition to the HPI above,   No Fever-chills, No Headache, No changes with Vision or hearing, No problems swallowing food or Liquids, No Chest pain, Cough or Shortness of Breath, No Abdominal pain, No Nausea or Vommitting, Bowel movements are regular, No Blood in stool or Urine, No dysuria, No new skin rashes or bruises, No new joints pains-aches,  No new weakness, tingling, numbness in any extremity, No recent weight gain or loss, No polyuria, polydypsia or polyphagia, No significant Mental Stressors.  A full 10 point Review of Systems was done, except as stated above, all other Review of  Systems were negative.   With Past History of the following :    Past Medical History  Diagnosis Date  . Alcoholic (HCC)   . Anxiety   . Post traumatic stress disorder (PTSD)   . Alcohol related seizure (HCC)   . Bipolar disorder (HCC)   . Tension headache, chronic     "monthly" (01/31/2015)  . Depression       Past Surgical History  Procedure Laterality Date  . Osteochondroma excision Right ~ 1996    "knee"  . Excisional hemorrhoidectomy  20111  . Refractive surgery Bilateral ~ 2011  Social History:     Social History  Substance Use Topics  . Smoking status: Never Smoker   . Smokeless tobacco: Never Used  . Alcohol Use: Yes     Comment: Wine and Vodka. Last drink: Today     Lives - at home, mobile and active      Family History :     Family History  Problem Relation Age of Onset  . Anxiety disorder Mother   . Anxiety disorder Sister   . Anxiety disorder Maternal Grandmother   . Anxiety disorder Father   . Alcohol abuse Father   . Alcohol abuse Brother   . Alcohol abuse Paternal Uncle        Home Medications:   Prior to Admission medications   Medication Sig Start Date End Date Taking? Authorizing Provider  ketorolac (TORADOL) 10 MG tablet Take 10 mg by mouth 2 (two) times daily as needed (pain.).   Yes Historical Provider, MD  lamoTRIgine (LAMICTAL) 150 MG tablet Take 150 mg by mouth daily.   Yes Historical Provider, MD  Levonorgestrel-Ethinyl Estradiol (CAMRESE) 0.15-0.03 &0.01 MG tablet Take 1 tablet by mouth daily.   Yes Historical Provider, MD  lurasidone (LATUDA) 40 MG TABS tablet Take 40 mg by mouth daily with breakfast.   Yes Historical Provider, MD  Melatonin 3 MG TABS Take 9 mg by mouth at bedtime.   Yes Historical Provider, MD  Multiple Vitamin (MULTIVITAMIN WITH MINERALS) TABS tablet Take 1 tablet by mouth daily. 02/19/15  Yes Carly Arlyce Harman, MD  naphazoline-glycerin (CLEAR EYES) 0.012-0.2 % SOLN Place 1 drop into both eyes 4 (four) times  daily as needed for irritation.   Yes Historical Provider, MD  ondansetron (ZOFRAN) 4 MG tablet Take 4 mg by mouth every 8 (eight) hours as needed for nausea or vomiting.   Yes Historical Provider, MD  thiamine 100 MG tablet Take 1 tablet (100 mg total) by mouth daily. 02/19/15  Yes Carly Arlyce Harman, MD  valACYclovir (VALTREX) 500 MG tablet Take 1,000 mg by mouth daily. 07/12/15  Yes Historical Provider, MD  folic acid (FOLVITE) 1 MG tablet Take 1 tablet (1 mg total) by mouth daily. Patient not taking: Reported on 08/13/2015 02/19/15   Su Hoff, MD  hydrOXYzine (ATARAX/VISTARIL) 25 MG tablet Take 1 tablet (25 mg total) by mouth every 4 (four) hours as needed for anxiety. Patient not taking: Reported on 08/13/2015 02/08/15   Adonis Brook, NP  lamoTRIgine (LAMICTAL) 25 MG tablet Take 1 tablet (25 mg total) by mouth daily. Patient not taking: Reported on 08/13/2015 02/08/15   Adonis Brook, NP  lurasidone (LATUDA) 80 MG TABS tablet Take 1 tablet (80 mg total) by mouth at bedtime. Patient not taking: Reported on 06/18/2015 02/08/15   Adonis Brook, NP     Allergies:     Allergies  Allergen Reactions  . Naltrexone Other (See Comments)    Makes her hurt everywhere. Anything higher than 25mg  gives her the issues.. Can take in smaller doses  . Baclofen Rash     Physical Exam:   Vitals  Blood pressure 140/92, pulse 135, temperature 98.9 F (37.2 C), temperature source Oral, resp. rate 13, SpO2 98 %.   1. General young white female lying in bed in NAD,    2. Normal affect and insight, Not Suicidal or Homicidal, Awake Alert, Oriented X 3.  3. No F.N deficits, ALL C.Nerves Intact, Strength 5/5 all 4 extremities, Sensation intact all 4 extremities, Plantars down going.  4.  Ears and Eyes appear Normal, Conjunctivae clear, PERRLA. Moist Oral Mucosa.  5. Supple Neck, No JVD, No cervical lymphadenopathy appriciated, No Carotid Bruits.  6. Symmetrical Chest wall movement, Good air movement  bilaterally, CTAB.  7. Rapid rate, RRR, No Gallops, Rubs or Murmurs, No Parasternal Heave.  8. Positive Bowel Sounds, Abdomen Soft, No tenderness, No organomegaly appriciated,No rebound -guarding or rigidity.  9.  No Cyanosis, Normal Skin Turgor, No Skin Rash or Bruise.  10. Good muscle tone,  joints appear normal , no effusions, Normal ROM.  11. No Palpable Lymph Nodes in Neck or Axillae      Data Review:    CBC  Recent Labs Lab 08/13/15 1130  WBC 10.0  HGB 13.4  HCT 39.1  PLT 256  MCV 89.5  MCH 30.7  MCHC 34.3  RDW 14.9  LYMPHSABS 1.9  MONOABS 0.3  EOSABS 0.0  BASOSABS 0.0   ------------------------------------------------------------------------------------------------------------------  Chemistries   Recent Labs Lab 08/13/15 1130  NA 135  K 3.9  CL 98*  CO2 18*  GLUCOSE 74  BUN 11  CREATININE 0.48  CALCIUM 7.5*  AST 63*  ALT 98*  ALKPHOS 31*  BILITOT 0.7   ------------------------------------------------------------------------------------------------------------------ CrCl cannot be calculated (Unknown ideal weight.). ------------------------------------------------------------------------------------------------------------------ No results for input(s): TSH, T4TOTAL, T3FREE, THYROIDAB in the last 72 hours.  Invalid input(s): FREET3  Coagulation profile No results for input(s): INR, PROTIME in the last 168 hours. ------------------------------------------------------------------------------------------------------------------- No results for input(s): DDIMER in the last 72 hours. -------------------------------------------------------------------------------------------------------------------  Cardiac Enzymes No results for input(s): CKMB, TROPONINI, MYOGLOBIN in the last 168 hours.  Invalid input(s): CK ------------------------------------------------------------------------------------------------------------------ No results found for:  BNP   ---------------------------------------------------------------------------------------------------------------  Urinalysis    Component Value Date/Time   COLORURINE YELLOW 05/29/2015 1932   APPEARANCEUR CLOUDY* 05/29/2015 1932   LABSPEC 1.013 05/29/2015 1932   PHURINE 5.0 05/29/2015 1932   GLUCOSEU NEGATIVE 05/29/2015 1932   HGBUR SMALL* 05/29/2015 1932   BILIRUBINUR NEGATIVE 05/29/2015 1932   KETONESUR 40* 05/29/2015 1932   PROTEINUR 100* 05/29/2015 1932   UROBILINOGEN 0.2 12/31/2013 2136   NITRITE NEGATIVE 05/29/2015 1932   LEUKOCYTESUR NEGATIVE 05/29/2015 1932    ----------------------------------------------------------------------------------------------------------------   Imaging Results:    No results found.  My personal review of EKG: Rhythm S.tach, Rate  114 /min, no Acute ST changes   Assessment & Plan:     1. Alcohol intoxication with resulting tachycardia. Admit to stepdown, place on scheduled Librium along with CIWA protocol, place on low-dose beta blocker and clonidine and monitor. She was counseled to quit alcohol abuse. Has been aggressively hydrated in the ER will continue maintenance IV fluids.  2. Bipolar disorder. Not suicidal homicidal, continue home medications, outpatient psych follow-up.  3. Mild transaminitis due to alcohol abuse, follow trend than outpatient GI follow-up.  4. One episode of scant blood in emesis. Likely small Mallory-Weiss tear, place on PPI twice a day and monitor. Hemoccult negative stool, no dark stools.   DVT Prophylaxis Heparin    AM Labs Ordered, also please review Full Orders  Family Communication: Admission, patients condition and plan of care including tests being ordered have been discussed with the patient  who indicates understanding and agree with the plan and Code Status.  Code Status Full  Likely DC to  Home 2-3 days  Condition GUARDED     Consults called: None  Admission status: Inpt  Time  spent in minutes : 35   Lavert Matousek K M.D on 08/13/2015 at 1:42 PM  Between 7am to  7pm - Pager - [phone]. After 7pm go to [email or web address] - password [facility]  [facility] - Office  [phone]

## 2015-08-13 NOTE — ED Notes (Signed)
Per pt, was in [facility].  Pt was d/c for 30 days.  Pt is alcoholic.  Pt started drinking heavily for past week.  Drinking "many bottle of wine".  Amount unknown.  Started vomiting today.  Pt states blood in emesis.  Not eaten for several days.

## 2015-08-13 NOTE — ED Provider Notes (Signed)
CSN: [id number]     Arrival date & time 08/13/15  0809 History   First MD Initiated Contact with Patient 08/13/15 0818     Chief Complaint  Patient presents with  . Emesis  . Alcohol Problem     (Consider location/radiation/quality/duration/timing/severity/associated sxs/prior Treatment) HPI Comments: 35 year old female with history of alcohol abuse presents due to vomiting which she states has had some blood clots. Patient states that she had 30 days of sobriety but due to a relationship that ended she started to drink again. States that she drinks copious amounts of wine. Denies illicit drug use. Denies any black stools. Has had some diffuse abdominal discomfort. She attempted to drink alcohol this morning but was unsuccessful and had vomiting. No treatment use prior to arrival. Nothing makes her symptoms better. Denies any suicidal or homicidal ideations. Does admit to increased depression.  Patient is a 35 y.o. female presenting with vomiting and alcohol problem. The history is provided by the patient.  Emesis Alcohol Problem    Past Medical History  Diagnosis Date  . Alcoholic (HCC)   . Anxiety   . Post traumatic stress disorder (PTSD)   . Alcohol related seizure (HCC)   . Bipolar disorder (HCC)   . Tension headache, chronic     "monthly" (01/31/2015)  . Depression    Past Surgical History  Procedure Laterality Date  . Osteochondroma excision Right ~ 1996    "knee"  . Excisional hemorrhoidectomy  20111  . Refractive surgery Bilateral ~ 2011   Family History  Problem Relation Age of Onset  . Anxiety disorder Mother   . Anxiety disorder Sister   . Anxiety disorder Maternal Grandmother   . Anxiety disorder Father   . Alcohol abuse Father   . Alcohol abuse Brother   . Alcohol abuse Paternal Uncle    Social History  Substance Use Topics  . Smoking status: Never Smoker   . Smokeless tobacco: Never Used  . Alcohol Use: Yes     Comment: Wine and Vodka. Last drink: Today    OB History    No data available     Review of Systems  Gastrointestinal: Positive for vomiting.  All other systems reviewed and are negative.     Allergies  Naltrexone and Baclofen  Home Medications   Prior to Admission medications   Medication Sig Start Date End Date Taking? Authorizing Provider  busPIRone (BUSPAR) 10 MG tablet Take 10 mg by mouth 3 (three) times daily.    Historical Provider, MD  folic acid (FOLVITE) 1 MG tablet Take 1 tablet (1 mg total) by mouth daily. 02/19/15   Carly Arlyce HarmanJ Rivet, MD  gabapentin (NEURONTIN) 100 MG capsule Take 100 mg by mouth 3 (three) times daily.    Historical Provider, MD  hydrOXYzine (ATARAX/VISTARIL) 25 MG tablet Take 1 tablet (25 mg total) by mouth every 4 (four) hours as needed for anxiety. 02/08/15   Adonis BrookSheila Agustin, NP  ketorolac (TORADOL) 10 MG tablet Take 10 mg by mouth 2 (two) times daily as needed (pain.).    Historical Provider, MD  lamoTRIgine (LAMICTAL) 25 MG tablet Take 1 tablet (25 mg total) by mouth daily. Patient taking differently: Take 50 mg by mouth every morning.  02/08/15   Adonis BrookSheila Agustin, NP  Levonorgestrel-Ethinyl Estradiol (CAMRESE) 0.15-0.03 &0.01 MG tablet Take 1 tablet by mouth daily.    Historical Provider, MD  lurasidone (LATUDA) 80 MG TABS tablet Take 1 tablet (80 mg total) by mouth at bedtime. Patient not taking: Reported  on 06/18/2015 02/08/15   Adonis Brook, NP  Melatonin 3 MG TABS Take 9 mg by mouth at bedtime.    Historical Provider, MD  Multiple Vitamin (MULTIVITAMIN WITH MINERALS) TABS tablet Take 1 tablet by mouth daily. 02/19/15   Su Hoff, MD  naphazoline-glycerin (CLEAR EYES) 0.012-0.2 % SOLN Place 1 drop into both eyes 4 (four) times daily as needed for irritation.    Historical Provider, MD  thiamine 100 MG tablet Take 1 tablet (100 mg total) by mouth daily. 02/19/15   Carly Arlyce Harman, MD  tiZANidine (ZANAFLEX) 2 MG tablet Take 2 mg by mouth 2 (two) times daily as needed for muscle spasms.    Historical  Provider, MD  traZODone (DESYREL) 50 MG tablet Take 50 mg by mouth at bedtime as needed for sleep.    Historical Provider, MD   BP 153/78 mmHg  Pulse 116  Temp(Src) 98.9 F (37.2 C) (Oral)  Resp 22  SpO2 93% Physical Exam  Constitutional: She is oriented to person, place, and time. She appears well-developed and well-nourished.  Non-toxic appearance. No distress.  HENT:  Head: Normocephalic and atraumatic.  Eyes: Conjunctivae, EOM and lids are normal. Pupils are equal, round, and reactive to light.  Neck: Normal range of motion. Neck supple. No tracheal deviation present. No thyroid mass present.  Cardiovascular: Regular rhythm and normal heart sounds.  Tachycardia present.  Exam reveals no gallop.   No murmur heard. Pulmonary/Chest: Effort normal and breath sounds normal. No stridor. No respiratory distress. She has no decreased breath sounds. She has no wheezes. She has no rhonchi. She has no rales.  Abdominal: Soft. Normal appearance and bowel sounds are normal. She exhibits no distension. There is tenderness in the epigastric area. There is no rigidity, no rebound, no guarding and no CVA tenderness.    Musculoskeletal: Normal range of motion. She exhibits no edema or tenderness.  Neurological: She is alert and oriented to person, place, and time. She has normal strength. No cranial nerve deficit or sensory deficit. GCS eye subscore is 4. GCS verbal subscore is 5. GCS motor subscore is 6.  Skin: Skin is warm and dry. No abrasion and no rash noted.  Psychiatric: She has a normal mood and affect. Her speech is normal and behavior is normal.  Nursing note and vitals reviewed.   ED Course  Procedures (including critical care time) Labs Review Labs Reviewed  CBC WITH DIFFERENTIAL/PLATELET  COMPREHENSIVE METABOLIC PANEL  LIPASE, BLOOD  ETHANOL  POC OCCULT BLOOD, ED    Imaging Review No results found. I have personally reviewed and evaluated these images and lab results as part  of my medical decision-making.   EKG Interpretation None      MDM   Final diagnoses:  None    Patient given IV fluids as well as Ativan and medications for vomiting. After 3 L of IV saline patient remains orthostatic. She also is tachycardic and was treated with more Ativan. We'll admit for further evaluation    Lorre Nick, MD 08/13/15 1255

## 2015-08-13 NOTE — ED Notes (Signed)
IV Team has been called for placement of IV,blood collections will be collected at that time.

## 2015-08-14 LAB — COMPREHENSIVE METABOLIC PANEL
ALT: 77 U/L — ABNORMAL HIGH (ref 14–54)
ANION GAP: 6 (ref 5–15)
AST: 48 U/L — ABNORMAL HIGH (ref 15–41)
Albumin: 3.5 g/dL (ref 3.5–5.0)
Alkaline Phosphatase: 31 U/L — ABNORMAL LOW (ref 38–126)
BILIRUBIN TOTAL: 1.4 mg/dL — AB (ref 0.3–1.2)
BUN: 7 mg/dL (ref 6–20)
CO2: 26 mmol/L (ref 22–32)
Calcium: 8 mg/dL — ABNORMAL LOW (ref 8.9–10.3)
Chloride: 105 mmol/L (ref 101–111)
Creatinine, Ser: 0.44 mg/dL (ref 0.44–1.00)
GFR calc Af Amer: >60 mL/min (ref >60)
Glucose, Bld: 97 mg/dL (ref 65–99)
POTASSIUM: 3.2 mmol/L — AB (ref 3.5–5.1)
Sodium: 137 mmol/L (ref 135–145)
TOTAL PROTEIN: 6.2 g/dL — AB (ref 6.5–8.1)

## 2015-08-14 LAB — URINE CULTURE

## 2015-08-14 LAB — CBC
HCT: 35.3 % — ABNORMAL LOW (ref 36.0–46.0)
Hemoglobin: 12.2 g/dL (ref 12.0–15.0)
MCH: 31.1 pg (ref 26.0–34.0)
MCHC: 34.6 g/dL (ref 30.0–36.0)
MCV: 90.1 fL (ref 78.0–100.0)
PLATELETS: 195 10*3/uL (ref 150–400)
RBC: 3.92 MIL/uL (ref 3.87–5.11)
RDW: 14.9 % (ref 11.5–15.5)
WBC: 6 10*3/uL (ref 4.0–10.5)

## 2015-08-14 LAB — MAGNESIUM: Magnesium: 1.9 mg/dL (ref 1.7–2.4)

## 2015-08-14 LAB — TSH: TSH: 1.798 u[IU]/mL (ref 0.350–4.500)

## 2015-08-14 MED ORDER — PANTOPRAZOLE SODIUM 40 MG PO TBEC: 40.0000 mg | DELAYED_RELEASE_TABLET | Freq: Every day | ORAL | Status: AC

## 2015-08-14 MED ORDER — METOPROLOL TARTRATE 50 MG PO TABS: 50.0000 mg | ORAL_TABLET | Freq: Two times a day (BID) | ORAL | Status: AC

## 2015-08-14 NOTE — Discharge Instructions (Signed)
Follow with Primary MD Verlon AuBoyd, Tammy Lamonica, MD in 2-3 days   Get CBC, CMP, 2 view Chest X ray checked  by Primary MD or SNF MD in 5-7 days ( we routinely change or add medications that can affect your baseline labs and fluid status, therefore we recommend that you get the mentioned basic workup next visit with your PCP, your PCP may decide not to get them or add new tests based on their clinical decision)   Activity: As tolerated with Full fall precautions use walker/cane & assistance as needed   Disposition Home     Diet:   Heart Healthy    For Heart failure patients - Check your Weight same time everyday, if you gain over 2 pounds, or you develop in leg swelling, experience more shortness of breath or chest pain, call your Primary MD immediately. Follow Cardiac Low Salt Diet and 1.5 lit/day fluid restriction.   On your next visit with your primary care physician please Get Medicines reviewed and adjusted.   Please request your Prim.MD to go over all Hospital Tests and Procedure/Radiological results at the follow up, please get all Hospital records sent to your Prim MD by signing hospital release before you go home.   If you experience worsening of your admission symptoms, develop shortness of breath, life threatening emergency, suicidal or homicidal thoughts you must seek medical attention immediately by calling 911 or calling your MD immediately  if symptoms less severe.  You Must read complete instructions/literature along with all the possible adverse reactions/side effects for all the Medicines you take and that have been prescribed to you. Take any new Medicines after you have completely understood and accpet all the possible adverse reactions/side effects.   Do not drive, operate heavy machinery, perform activities at heights, swimming or participation in water activities or provide baby sitting services if your were admitted for syncope or siezures until you have seen by Primary  MD or a Neurologist and advised to do so again.  Do not drive when taking Pain medications.    Do not take more than prescribed Pain, Sleep and Anxiety Medications  Special Instructions: If you have smoked or chewed Tobacco  in the last 2 yrs please stop smoking, stop any regular Alcohol  and or any Recreational drug use.  Wear Seat belts while driving.   Please note  You were cared for by a hospitalist during your hospital stay. If you have any questions about your discharge medications or the care you received while you were in the hospital after you are discharged, you can call the unit and asked to speak with the hospitalist on call if the hospitalist that took care of you is not available. Once you are discharged, your primary care physician will handle any further medical issues. Please note that NO REFILLS for any discharge medications will be authorized once you are discharged, as it is imperative that you return to your primary care physician (or establish a relationship with a primary care physician if you do not have one) for your aftercare needs so that they can reassess your need for medications and monitor your lab values.

## 2015-08-14 NOTE — Discharge Summary (Addendum)
[name] Stewart JXB:[id number] DOB: 09/24/1980 DOA: 08/13/2015  PCP: Verlon AuBoyd, Tammy Lamonica, MD  Admit date: 08/13/2015  Discharge date: 08/14/2015  Admitted From: Home   Disposition:  Home   Recommendations for Outpatient Follow-up:   Follow up with PCP in 1-2 weeks  PCP Please obtain BMP/CBC, 2 view CXR in 1week,  (see Discharge instructions)   PCP Please follow up on the following pending results: None   Home Health: none Equipment/Devices: None  Consultations: None Discharge Condition: Stable  CODE STATUS: Full   Diet Recommendation: Heart Healthy   Chief Complaint  Patient presents with  . Emesis  . Alcohol Problem     Brief history of present illness from the day of admission and additional interim summary    [name] Stewart is a 35 y.o. female, History of alcohol abuse, bipolar disorder, multiple hospital admissions and he visits for alcohol-related problems comes into the hospital after a five-day history of alcoholic binging resulting in multiple episodes of nausea and vomiting this morning, she came to the hospital with nausea vomiting, scant blood in emesis x 1, Hame Occ -ve in stool in the ER. He denies any fever chills, no headache chest abdominal pain. No focal weakness. She was recently in an alcohol rehabilitation.  In the ER, workup suggestive of alcohol intoxication, Hemoccult-negative in stool, I was called to admit the patient for alcohol intoxication.  Hospital issues addressed    1. Alcohol intoxication, dehydration with resulting tachycardia. Stable and better after IVF and Librium, she says she relapsed 3-4 days ago and has not been drinking any longer than that, she says she is not going in withdrawal and would like to be discharged ASAP, she already follows with AA and refuses any help.  2.  Bipolar disorder. Not suicidal homicidal, continue home medications, outpatient psych follow-up.  3. Mild transaminitis due to alcohol abuse, trending down, no Abd pain, follow trend with PCP.  4. One episode of scant blood in emesis. Likely small Mallory-Weiss tear, placed on PPI , stable now. Hemoccult negative stool, no dark stools.  5. HTN - placed on B blocker.  6. Hypokalemia - replaced.     Discharge diagnosis     Principal Problem:   Alcohol dependence with uncomplicated withdrawal (HCC) Active Problems:   PTSD (post-traumatic stress disorder)   Transaminitis   Bipolar disorder in full remission (HCC)   Alcohol intoxication (HCC)    Discharge instructions    Discharge Instructions    Diet - low sodium heart healthy    Complete by:  As directed      Discharge instructions    Complete by:  As directed   Follow with Primary MD Verlon AuBoyd, Tammy Lamonica, MD in 2-3 days   Get CBC, CMP, 2 view Chest X ray checked  by Primary MD or SNF MD in 5-7 days ( we routinely change or add medications that can affect your baseline labs and fluid status, therefore we recommend that you get the mentioned basic workup next visit with your PCP, your  PCP may decide not to get them or add new tests based on their clinical decision)   Activity: As tolerated with Full fall precautions use walker/cane & assistance as needed   Disposition Home     Diet:   Heart Healthy    For Heart failure patients - Check your Weight same time everyday, if you gain over 2 pounds, or you develop in leg swelling, experience more shortness of breath or chest pain, call your Primary MD immediately. Follow Cardiac Low Salt Diet and 1.5 lit/day fluid restriction.   On your next visit with your primary care physician please Get Medicines reviewed and adjusted.   Please request your Prim.MD to go over all Hospital Tests and Procedure/Radiological results at the follow up, please get all Hospital records sent to  your Prim MD by signing hospital release before you go home.   If you experience worsening of your admission symptoms, develop shortness of breath, life threatening emergency, suicidal or homicidal thoughts you must seek medical attention immediately by calling 911 or calling your MD immediately  if symptoms less severe.  You Must read complete instructions/literature along with all the possible adverse reactions/side effects for all the Medicines you take and that have been prescribed to you. Take any new Medicines after you have completely understood and accpet all the possible adverse reactions/side effects.   Do not drive, operate heavy machinery, perform activities at heights, swimming or participation in water activities or provide baby sitting services if your were admitted for syncope or siezures until you have seen by Primary MD or a Neurologist and advised to do so again.  Do not drive when taking Pain medications.    Do not take more than prescribed Pain, Sleep and Anxiety Medications  Special Instructions: If you have smoked or chewed Tobacco  in the last 2 yrs please stop smoking, stop any regular Alcohol  and or any Recreational drug use.  Wear Seat belts while driving.   Please note  You were cared for by a hospitalist during your hospital stay. If you have any questions about your discharge medications or the care you received while you were in the hospital after you are discharged, you can call the unit and asked to speak with the hospitalist on call if the hospitalist that took care of you is not available. Once you are discharged, your primary care physician will handle any further medical issues. Please note that NO REFILLS for any discharge medications will be authorized once you are discharged, as it is imperative that you return to your primary care physician (or establish a relationship with a primary care physician if you do not have one) for your aftercare needs so that  they can reassess your need for medications and monitor your lab values.     Increase activity slowly    Complete by:  As directed            Discharge Medications     Medication List    TAKE these medications        CAMRESE 0.15-0.03 &0.01 MG tablet  Generic drug:  Levonorgestrel-Ethinyl Estradiol  Take 1 tablet by mouth daily.     folic acid 1 MG tablet  Commonly known as:  FOLVITE  Take 1 tablet (1 mg total) by mouth daily.     hydrOXYzine 25 MG tablet  Commonly known as:  ATARAX/VISTARIL  Take 1 tablet (25 mg total) by mouth every 4 (four) hours as needed for anxiety.  ketorolac 10 MG tablet  Commonly known as:  TORADOL  Take 10 mg by mouth 2 (two) times daily as needed (pain.).     lamoTRIgine 150 MG tablet  Commonly known as:  LAMICTAL  Take 150 mg by mouth daily.     lamoTRIgine 25 MG tablet  Commonly known as:  LAMICTAL  Take 1 tablet (25 mg total) by mouth daily.     lurasidone 40 MG Tabs tablet  Commonly known as:  LATUDA  Take 40 mg by mouth daily with breakfast.     lurasidone 80 MG Tabs tablet  Commonly known as:  LATUDA  Take 1 tablet (80 mg total) by mouth at bedtime.     Melatonin 3 MG Tabs  Take 9 mg by mouth at bedtime.     metoprolol 50 MG tablet  Commonly known as:  LOPRESSOR  Take 1 tablet (50 mg total) by mouth 2 (two) times daily.     multivitamin with minerals Tabs tablet  Take 1 tablet by mouth daily.     naphazoline-glycerin 0.012-0.2 % Soln  Commonly known as:  CLEAR EYES  Place 1 drop into both eyes 4 (four) times daily as needed for irritation.     ondansetron 4 MG tablet  Commonly known as:  ZOFRAN  Take 4 mg by mouth every 8 (eight) hours as needed for nausea or vomiting.     pantoprazole 40 MG tablet  Commonly known as:  PROTONIX  Take 1 tablet (40 mg total) by mouth daily.     thiamine 100 MG tablet  Take 1 tablet (100 mg total) by mouth daily.     valACYclovir 500 MG tablet  Commonly known as:  VALTREX    Take 1,000 mg by mouth daily.        Allergies  Allergen Reactions  . Naltrexone Other (See Comments)    Makes her hurt everywhere. Anything higher than 25mg  gives her the issues.. Can take in smaller doses  . Baclofen Rash        Follow-up Information    Follow up with Verlon Au, MD. Schedule an appointment as soon as possible for a visit in 2 days.   Specialty:  Family Medicine   Contact information:   [address] [place] [address] [phone]       Major procedures and Radiology Reports - PLEASE review detailed and final reports thoroughly  -          No results found.  Micro Results      Recent Results (from the past 240 hour(s))  MRSA PCR Screening     Status: None   Collection Time: 08/13/15  2:40 PM  Result Value Ref Range Status   MRSA by PCR NEGATIVE NEGATIVE Final    Comment:        The GeneXpert MRSA Assay (FDA approved for NASAL specimens only), is one component of a comprehensive MRSA colonization surveillance program. It is not intended to diagnose MRSA infection nor to guide or monitor treatment for MRSA infections.     Today   Subjective    [name] Stewart today has no headache,no chest abdominal pain,no new weakness tingling or numbness, feels much better wants to go home today.    Objective   Blood pressure 137/99, pulse 92, temperature 98 F (36.7 C), temperature source Oral, resp. rate 12, height 5\' 4"  (1.626 m), weight 63.8 kg (140 lb 10.5 oz), SpO2 99 %.   Intake/Output Summary (Last  24 hours) at 08/14/15 0856 Last data filed at 08/14/15 0800  Gross per 24 hour  Intake 2938.33 ml  Output   1650 ml  Net 1288.33 ml    Exam Awake Alert, Oriented x 3, No new F.N deficits, Normal affect .AT,PERRAL Supple Neck,No JVD, No cervical lymphadenopathy appriciated.  Symmetrical Chest wall movement, Good air movement bilaterally, CTAB RRR,No Gallops,Rubs or new Murmurs, No Parasternal Heave +ve  B.Sounds, Abd Soft, Non tender, No organomegaly appriciated, No rebound -guarding or rigidity. No Cyanosis, Clubbing or edema, No new Rash or bruise   Data Review   CBC w Diff: Lab Results  Component Value Date   WBC 6.0 08/14/2015   HGB 12.2 08/14/2015   HCT 35.3* 08/14/2015   PLT 195 08/14/2015   LYMPHOPCT 19 08/13/2015   MONOPCT 3 08/13/2015   EOSPCT 0 08/13/2015   BASOPCT 0 08/13/2015    CMP: Lab Results  Component Value Date   NA 137 08/14/2015   K 3.2* 08/14/2015   CL 105 08/14/2015   CO2 26 08/14/2015   BUN 7 08/14/2015   CREATININE 0.44 08/14/2015   PROT 6.2* 08/14/2015   ALBUMIN 3.5 08/14/2015   BILITOT 1.4* 08/14/2015   ALKPHOS 31* 08/14/2015   AST 48* 08/14/2015   ALT 77* 08/14/2015  .   Total Time in preparing paper work, data evaluation and todays exam - 35 minutes  Leroy Sea M.D on 08/14/2015 at 8:56 AM  [facility]   Office  [phone]

## 2015-08-14 NOTE — Progress Notes (Signed)
Discussed all discharge instructions with patient and she expressed understanding. Given the number to call if she has any questions. Patient discharged home with all belongings.

## 2015-08-29 ENCOUNTER — Emergency Department (HOSPITAL_COMMUNITY)
Admission: EM | Admit: 2015-08-29 | Discharge: 2015-08-29 | Disposition: A | Discharge status: Home / Self Care | Payer: BLUE CROSS/BLUE SHIELD | Attending: Emergency Medicine | Admitting: Emergency Medicine

## 2015-08-29 ENCOUNTER — Emergency Department (HOSPITAL_COMMUNITY): Payer: BLUE CROSS/BLUE SHIELD

## 2015-08-29 ENCOUNTER — Encounter (HOSPITAL_COMMUNITY): Payer: Self-pay

## 2015-08-29 DIAGNOSIS — F10920 Alcohol use, unspecified with intoxication, uncomplicated: Secondary | ICD-10-CM

## 2015-08-29 DIAGNOSIS — W1809XA Striking against other object with subsequent fall, initial encounter: Secondary | ICD-10-CM | POA: Insufficient documentation

## 2015-08-29 DIAGNOSIS — W19XXXA Unspecified fall, initial encounter: Secondary | ICD-10-CM

## 2015-08-29 DIAGNOSIS — S5012XA Contusion of left forearm, initial encounter: Secondary | ICD-10-CM | POA: Diagnosis not present

## 2015-08-29 DIAGNOSIS — S0990XA Unspecified injury of head, initial encounter: Secondary | ICD-10-CM | POA: Diagnosis present

## 2015-08-29 DIAGNOSIS — F10120 Alcohol abuse with intoxication, uncomplicated: Secondary | ICD-10-CM | POA: Insufficient documentation

## 2015-08-29 DIAGNOSIS — S0003XA Contusion of scalp, initial encounter: Secondary | ICD-10-CM | POA: Insufficient documentation

## 2015-08-29 DIAGNOSIS — S40021A Contusion of right upper arm, initial encounter: Secondary | ICD-10-CM | POA: Insufficient documentation

## 2015-08-29 DIAGNOSIS — Y999 Unspecified external cause status: Secondary | ICD-10-CM | POA: Insufficient documentation

## 2015-08-29 DIAGNOSIS — Y9389 Activity, other specified: Secondary | ICD-10-CM | POA: Insufficient documentation

## 2015-08-29 DIAGNOSIS — Y9289 Other specified places as the place of occurrence of the external cause: Secondary | ICD-10-CM | POA: Diagnosis not present

## 2015-08-29 DIAGNOSIS — Z79899 Other long term (current) drug therapy: Secondary | ICD-10-CM | POA: Diagnosis not present

## 2015-08-29 DIAGNOSIS — F1010 Alcohol abuse, uncomplicated: Secondary | ICD-10-CM

## 2015-08-29 DIAGNOSIS — T07XXXA Unspecified multiple injuries, initial encounter: Secondary | ICD-10-CM

## 2015-08-29 NOTE — ED Notes (Signed)
Patient transported to CT 

## 2015-08-29 NOTE — ED Notes (Signed)
Patient took all belongings with her. Patient went home.

## 2015-08-29 NOTE — ED Notes (Signed)
Inda Coke is taking responsibility of patient.

## 2015-08-29 NOTE — ED Notes (Signed)
Patient does not want to stay. MD said discharge patient

## 2015-08-29 NOTE — ED Notes (Signed)
Patient walked without difficulty. Patient mom was called. Mom told me to call [place]. Cala Bradford was called and no answer. Left voicemail for her to call Dupree Givler at [phone]. Patient called an old AA sponsor name Candy. Drue Flirt is coming to get patient.

## 2015-08-29 NOTE — ED Triage Notes (Signed)
Per EMS, Pt, from home, c/o ETOH intoxication x 5 days and generalized pain.  Bruising noted to R arm and scrapes noted to L arm.  EMS noted 8- bottles of wine.  AA sponsor on scene and reports that he has been bringing the Pt ETOH to help "ween her off."

## 2015-08-29 NOTE — ED Notes (Signed)
Bed: [facility] Expected date:  Expected time:  Means of arrival:  Comments: 

## 2015-08-29 NOTE — ED Provider Notes (Addendum)
[facility]-EMERGENCY DEPT Provider Note   CSN: [id number] Arrival date & time: 08/29/15  1604  First Provider Contact:  None       History   Chief Complaint Chief Complaint  Patient presents with  . Alcohol Intoxication  . Generalized Body Aches    HPI Ardythe Klute is a 35 y.o. female.  HPI Patient is brought from home for severe alcohol intoxication. Reportedly she is intoxicated for 5 days. Patient is complaining of pain all over. She is not localizing any pain. She has bruising and dried abrasion to her left forearm. She denies knowing how that happened. Reportedly patient's AA sponsor was on the scene upon EMS arrival. She has been trying to help the patient wean off of alcohol. EMS reported on scene 8 750 mL bottles of wine. When patient was in the emergency department room, she was standing attempting to go to the restroom. Nurse attempted to have the patient sit back down again but patient fell to the side and struck the side of her face on the counter. No loss of consciousness associated. Patient was placed on backboard with cervical collar. Past Medical History:  Diagnosis Date  . Alcohol related seizure (HCC)   . Alcoholic (HCC)   . Anxiety   . Bipolar disorder (HCC)   . Depression   . Post traumatic stress disorder (PTSD)   . Tension headache, chronic    "monthly" (01/31/2015)    Patient Active Problem List   Diagnosis Date Noted  . Alcohol intoxication (HCC) 08/13/2015  . Alcohol dependence (HCC) 05/29/2015  . Depression 05/29/2015  . Alcohol dependence with uncomplicated withdrawal (HCC)   . Generalized anxiety disorder   . Bipolar disorder in full remission (HCC)   . Transaminitis 02/17/2015  . Major depressive disorder, recurrent episode, severe (HCC) 02/05/2015  . PTSD (post-traumatic stress disorder) 12/14/2014  . Bipolar disorder with severe depression (HCC) 12/14/2014  . Alcohol use disorder, severe, dependence (HCC) 12/13/2014  . Metabolic acidosis 01/01/2014    . Alcohol withdrawal (HCC) 01/01/2014  . Alcohol abuse 01/01/2014  . GERD (gastroesophageal reflux disease) 01/01/2014  . Coarse tremors 01/01/2014    Past Surgical History:  Procedure Laterality Date  . EXCISIONAL HEMORRHOIDECTOMY  20111  . OSTEOCHONDROMA EXCISION Right ~ 1996   "knee"  . REFRACTIVE SURGERY Bilateral ~ 2011    OB History    No data available       Home Medications    Prior to Admission medications   Medication Sig Start Date End Date Taking? Authorizing Provider  folic acid (FOLVITE) 1 MG tablet Take 1 tablet (1 mg total) by mouth daily. Patient not taking: Reported on 08/13/2015 02/19/15   Su Hoff, MD  hydrOXYzine (ATARAX/VISTARIL) 25 MG tablet Take 1 tablet (25 mg total) by mouth every 4 (four) hours as needed for anxiety. Patient not taking: Reported on 08/13/2015 02/08/15   Adonis Brook, NP  ketorolac (TORADOL) 10 MG tablet Take 10 mg by mouth 2 (two) times daily as needed (pain.).    Historical Provider, MD  lamoTRIgine (LAMICTAL) 150 MG tablet Take 150 mg by mouth daily.    Historical Provider, MD  lamoTRIgine (LAMICTAL) 25 MG tablet Take 1 tablet (25 mg total) by mouth daily. Patient not taking: Reported on 08/13/2015 02/08/15   Adonis Brook, NP  Levonorgestrel-Ethinyl Estradiol (CAMRESE) 0.15-0.03 &0.01 MG tablet Take 1 tablet by mouth daily.    Historical Provider, MD  lurasidone (LATUDA) 40 MG TABS tablet Take 40 mg by mouth daily  with breakfast.    Historical Provider, MD  lurasidone (LATUDA) 80 MG TABS tablet Take 1 tablet (80 mg total) by mouth at bedtime. Patient not taking: Reported on 06/18/2015 02/08/15   Adonis Brook, NP  Melatonin 3 MG TABS Take 9 mg by mouth at bedtime.    Historical Provider, MD  metoprolol tartrate (LOPRESSOR) 50 MG tablet Take 1 tablet (50 mg total) by mouth 2 (two) times daily. 08/14/15   Leroy Sea, MD  Multiple Vitamin (MULTIVITAMIN WITH MINERALS) TABS tablet Take 1 tablet by mouth daily. 02/19/15   Su Hoff, MD  naphazoline-glycerin (CLEAR EYES) 0.012-0.2 % SOLN Place 1 drop into both eyes 4 (four) times daily as needed for irritation.    Historical Provider, MD  ondansetron (ZOFRAN) 4 MG tablet Take 4 mg by mouth every 8 (eight) hours as needed for nausea or vomiting.    Historical Provider, MD  pantoprazole (PROTONIX) 40 MG tablet Take 1 tablet (40 mg total) by mouth daily. 08/14/15   Leroy Sea, MD  thiamine 100 MG tablet Take 1 tablet (100 mg total) by mouth daily. 02/19/15   Carly Arlyce Harman, MD  valACYclovir (VALTREX) 500 MG tablet Take 1,000 mg by mouth daily. 07/12/15   Historical Provider, MD    Family History Family History  Problem Relation Age of Onset  . Anxiety disorder Mother   . Anxiety disorder Sister   . Anxiety disorder Maternal Grandmother   . Anxiety disorder Father   . Alcohol abuse Father   . Alcohol abuse Brother   . Alcohol abuse Paternal Uncle     Social History Social History  Substance Use Topics  . Smoking status: Never Smoker  . Smokeless tobacco: Never Used  . Alcohol use Yes     Comment: Wine and Vodka. Last drink: Today     Allergies   Naltrexone and Baclofen   Review of Systems Review of Systems Cannot provide review of systems level V caveat acute alcohol intoxication.  Physical Exam Updated Vital Signs BP 118/81 (BP Location: Left Arm)   Pulse (!) 138 Comment: RN,Janeen made aware of elevated heart rate.  Temp 98.4 F (36.9 C) (Oral)   Resp 18   SpO2 97%   Physical Exam  Constitutional: She appears well-developed and well-nourished.  Patient smells strongly of alcohol. She is awake and alert but tearful, speaking in mewling tones and a poor historian.  HENT:  Patient seems to have a soft hematoma to the right side of her parietal temporal scalp. No contusion or abrasion noted. No facial contusions or abrasions. No nasal bleeding. No dental injury.  Eyes:  Pupils are mid range and symmetric. Extraocular motions are intact.    Neck:  Cervical collar has been placed. I have palpated the patient's posterior neck maintaining her in a stationary position. She endorses discomfort. No step-off or palpable anomaly.  Cardiovascular: Normal rate, regular rhythm, normal heart sounds and intact distal pulses.   Pulmonary/Chest: Effort normal and breath sounds normal. She exhibits no tenderness.  Abdominal: Soft. She exhibits no distension. There is no tenderness.  Musculoskeletal: Normal range of motion.  Patient has blue-green large bruise on left forearm. There is a long up roughly 20 cm by half centimeter linear abrasion that is dry and has an eschar on it no surrounding erythema. Right lower portion of upper arm has a fairly large about 10 cm bruise. There are no bony deformities of the upper extremities. Patient complains both through normal range  of motion. Bilateral lower shotty do not have significant bruising no effusions of the joints. Normal range of motion.  Neurological: She is alert. No cranial nerve deficit. She exhibits normal muscle tone.  Skin: Skin is warm and dry.  Psychiatric:  Patient is acutely intoxicated.     ED Treatments / Results  Labs (all labs ordered are listed, but only abnormal results are displayed) Labs Reviewed - No data to display  EKG  EKG Interpretation None       Radiology Ct Head Wo Contrast  Result Date: 08/29/2015 CLINICAL DATA:  The patient was brought to the [facility] ran secondary to severe intoxication. Rib she fell well attempting to go to the restroom and struck the site of her face on a counter. There is no loss of consciousness. EXAM: CT HEAD WITHOUT CONTRAST CT CERVICAL SPINE WITHOUT CONTRAST TECHNIQUE: Multidetector CT imaging of the head and cervical spine was performed following the standard protocol without intravenous contrast. Multiplanar CT image reconstructions of the cervical spine were also generated. COMPARISON:  CT head without contrast 05/29/2015. FINDINGS: CT  HEAD FINDINGS Minimal soft tissue swelling is present in the high posterior left parietal scalp without an underlying fracture. Calvarium is intact. The paranasal sinuses and mastoid air cells are clear. No acute infarct, hemorrhage, or mass lesion is present. No significant white matter changes are seen. The ventricles are of normal size. There is no significant extra-axial fluid collection. CT CERVICAL SPINE FINDINGS The cervical spine is imaged from the skullbase through T1-2. Vertebral body heights and alignment are maintained. There is some straightening of the normal cervical lordosis. No acute fracture or traumatic subluxation is present. The lung apices are clear. The soft tissues of the neck are unremarkable. IMPRESSION: 1. Essentially negative CT of the head. Minimal soft tissue swelling may be present. No acute intracranial abnormality. 2. Negative CT of the cervical spine. Electronically Signed   By: Marin Roberts M.D.   On: 08/29/2015 18:57   Ct Cervical Spine Wo Contrast  Result Date: 08/29/2015 CLINICAL DATA:  The patient was brought to the [facility] ran secondary to severe intoxication. Rib she fell well attempting to go to the restroom and struck the site of her face on a counter. There is no loss of consciousness. EXAM: CT HEAD WITHOUT CONTRAST CT CERVICAL SPINE WITHOUT CONTRAST TECHNIQUE: Multidetector CT imaging of the head and cervical spine was performed following the standard protocol without intravenous contrast. Multiplanar CT image reconstructions of the cervical spine were also generated. COMPARISON:  CT head without contrast 05/29/2015. FINDINGS: CT HEAD FINDINGS Minimal soft tissue swelling is present in the high posterior left parietal scalp without an underlying fracture. Calvarium is intact. The paranasal sinuses and mastoid air cells are clear. No acute infarct, hemorrhage, or mass lesion is present. No significant white matter changes are seen. The ventricles are of normal  size. There is no significant extra-axial fluid collection. CT CERVICAL SPINE FINDINGS The cervical spine is imaged from the skullbase through T1-2. Vertebral body heights and alignment are maintained. There is some straightening of the normal cervical lordosis. No acute fracture or traumatic subluxation is present. The lung apices are clear. The soft tissues of the neck are unremarkable. IMPRESSION: 1. Essentially negative CT of the head. Minimal soft tissue swelling may be present. No acute intracranial abnormality. 2. Negative CT of the cervical spine. Electronically Signed   By: Marin Roberts M.D.   On: 08/29/2015 18:57    Procedures Procedures (including critical care time)  Medications Ordered in ED Medications - No data to display   Initial Impression / Assessment and Plan / ED Course  I have reviewed the triage vital signs and the nursing notes.  Pertinent labs & imaging results that were available during my care of the patient were reviewed by me and considered in my medical decision making (see chart for details).  Clinical Course     Final Clinical Impressions(s) / ED Diagnoses   Final diagnoses:  Alcohol intoxication, uncomplicated (HCC)  Fall, initial encounter  Multiple contusions  ETOH abuse   Patient has been on her cell phone sitting up in the stretcher throughout her visit. At no time has she exhibited somnolence or confusion. At this time she is demanding to leave. Patient is alert and cognitively intact. She is tachycardic however review of prior EMR indicates patient was just recently admitted with history of recurrent admissions for acute alcohol intoxication with withdrawal. At this time the patient does not wish further treatment. She is alert and has capacity for decision making. She'll be discharged and discharge instructions include recommendation for seeking outpatient treatment for chronic alcoholism. New Prescriptions New Prescriptions   No  medications on file     Arby Barrette, MD 08/29/15 1704    Arby Barrette, MD 08/29/15 2109

## 2015-08-29 NOTE — ED Notes (Signed)
Pt attempting to stand to walk to the restroom. Vernona Rieger NT entered room and attempted to help Pt sit back down.  Pt spun out of NT's grip, stumbled, and fell.  Pt's head hit bedside table.   C collar applied by this writer, Pt placed on backboard, and Pt placed on stretched by staff.    Pt denies numbness and tingling.  Full ROM noted.

## 2015-08-30 ENCOUNTER — Observation Stay (HOSPITAL_COMMUNITY)
Admission: EM | Admit: 2015-08-30 | Discharge: 2015-09-01 | Disposition: A | Discharge status: Home / Self Care | Payer: BLUE CROSS/BLUE SHIELD | Attending: Internal Medicine | Admitting: Internal Medicine

## 2015-08-30 DIAGNOSIS — Y908 Blood alcohol level of 240 mg/100 ml or more: Secondary | ICD-10-CM | POA: Insufficient documentation

## 2015-08-30 DIAGNOSIS — E875 Hyperkalemia: Secondary | ICD-10-CM | POA: Insufficient documentation

## 2015-08-30 DIAGNOSIS — F10939 Alcohol use, unspecified with withdrawal, unspecified: Secondary | ICD-10-CM | POA: Diagnosis present

## 2015-08-30 DIAGNOSIS — F10930 Alcohol use, unspecified with withdrawal, uncomplicated: Secondary | ICD-10-CM

## 2015-08-30 DIAGNOSIS — D649 Anemia, unspecified: Secondary | ICD-10-CM | POA: Insufficient documentation

## 2015-08-30 DIAGNOSIS — D72819 Decreased white blood cell count, unspecified: Secondary | ICD-10-CM | POA: Diagnosis not present

## 2015-08-30 DIAGNOSIS — R569 Unspecified convulsions: Secondary | ICD-10-CM

## 2015-08-30 DIAGNOSIS — Z79899 Other long term (current) drug therapy: Secondary | ICD-10-CM | POA: Diagnosis not present

## 2015-08-30 DIAGNOSIS — F10239 Alcohol dependence with withdrawal, unspecified: Secondary | ICD-10-CM | POA: Diagnosis not present

## 2015-08-30 DIAGNOSIS — F314 Bipolar disorder, current episode depressed, severe, without psychotic features: Secondary | ICD-10-CM | POA: Insufficient documentation

## 2015-08-30 DIAGNOSIS — F1020 Alcohol dependence, uncomplicated: Secondary | ICD-10-CM | POA: Diagnosis present

## 2015-08-30 DIAGNOSIS — K219 Gastro-esophageal reflux disease without esophagitis: Secondary | ICD-10-CM | POA: Insufficient documentation

## 2015-08-30 DIAGNOSIS — R000 Tachycardia, unspecified: Secondary | ICD-10-CM | POA: Diagnosis not present

## 2015-08-30 DIAGNOSIS — F19939 Other psychoactive substance use, unspecified with withdrawal, unspecified: Secondary | ICD-10-CM

## 2015-08-30 DIAGNOSIS — N179 Acute kidney failure, unspecified: Secondary | ICD-10-CM | POA: Insufficient documentation

## 2015-08-30 DIAGNOSIS — F10229 Alcohol dependence with intoxication, unspecified: Secondary | ICD-10-CM | POA: Diagnosis not present

## 2015-08-30 DIAGNOSIS — F10929 Alcohol use, unspecified with intoxication, unspecified: Secondary | ICD-10-CM | POA: Diagnosis present

## 2015-08-30 DIAGNOSIS — F19239 Other psychoactive substance dependence with withdrawal, unspecified: Secondary | ICD-10-CM

## 2015-08-30 DIAGNOSIS — F10230 Alcohol dependence with withdrawal, uncomplicated: Secondary | ICD-10-CM

## 2015-08-30 DIAGNOSIS — F10920 Alcohol use, unspecified with intoxication, uncomplicated: Secondary | ICD-10-CM

## 2015-08-30 LAB — I-STAT CHEM 8, ED
BUN: 7 mg/dL (ref 6–20)
CALCIUM ION: 0.91 mmol/L — AB (ref 1.13–1.30)
CREATININE: 1.1 mg/dL — AB (ref 0.44–1.00)
Chloride: 102 mmol/L (ref 101–111)
Glucose, Bld: 81 mg/dL (ref 65–99)
HCT: 47 % — ABNORMAL HIGH (ref 36.0–46.0)
HEMOGLOBIN: 16 g/dL — AB (ref 12.0–15.0)
Potassium: 5.8 mmol/L — ABNORMAL HIGH (ref 3.5–5.1)
SODIUM: 139 mmol/L (ref 135–145)
TCO2: 27 mmol/L (ref 0–100)

## 2015-08-30 LAB — ETHANOL: Alcohol, Ethyl (B): 332 mg/dL (ref <5)

## 2015-08-30 MED ORDER — LORAZEPAM 2 MG/ML IJ SOLN
2.0000 mg | INTRAMUSCULAR | Status: DC | PRN
  Administered 2015-08-30: 2 mg via INTRAVENOUS
  Filled 2015-08-30 (×2): qty 1

## 2015-08-30 MED ORDER — LORAZEPAM 1 MG PO TABS: 2.0000 mg | ORAL_TABLET | ORAL | Status: DC | PRN

## 2015-08-30 MED ORDER — SODIUM CHLORIDE 0.9 % IV BOLUS (SEPSIS)
2000.0000 mL | Freq: Once | INTRAVENOUS | Status: AC
  Administered 2015-08-30: 2000 mL via INTRAVENOUS

## 2015-08-30 MED ORDER — THIAMINE HCL 100 MG/ML IJ SOLN
100.0000 mg | Freq: Once | INTRAMUSCULAR | Status: AC
  Administered 2015-08-31: 100 mg via INTRAVENOUS
  Filled 2015-08-30: qty 2

## 2015-08-30 MED ORDER — SODIUM CHLORIDE 0.9 % IV BOLUS (SEPSIS)
1000.0000 mL | Freq: Once | INTRAVENOUS | Status: AC
  Administered 2015-08-30: 1000 mL via INTRAVENOUS

## 2015-08-30 MED ORDER — LORAZEPAM 2 MG/ML IJ SOLN: 1.0000 mg | Freq: Once | INTRAMUSCULAR | Status: DC

## 2015-08-30 NOTE — ED Notes (Signed)
Pt taken to restroom with Steady,  Pt not steady on her feet herself.

## 2015-08-30 NOTE — ED Notes (Signed)
ETOH 332, primary nurse made aware of same.

## 2015-08-30 NOTE — ED Notes (Signed)
Pt is unable to verify medical information at this time. She states "I dont know" to most questions. Pt is responsive to voice. Pt states her only pain is in her face from a fall yesterday. Np swelling is noted on her face, but there is bruising on bilateral wrists. Pt states she drinks wine, more than one bottle, but is unable to elaborate more. Pt denies AV/H

## 2015-08-30 NOTE — ED Triage Notes (Signed)
Pt here from home via EMS after her father called dispatch for a wellness check. Pt is here via ETOH intoxication. Pt requested transport here via EMS. Pt stated to EMS that she is unable to control her drinking. Pt is alert and oriented to voice and is able to maintain her airway. No distress noted. Will continue to monitor

## 2015-08-30 NOTE — ED Notes (Addendum)
Pt said she needed to use the restroom.   Tried standing up to walk and was not able.   Steady used

## 2015-08-30 NOTE — ED Notes (Signed)
Bed: [facility] Expected date:  Expected time:  Means of arrival:  Comments: EMS- 34yo F, ETOH intoxication

## 2015-08-31 ENCOUNTER — Encounter (HOSPITAL_COMMUNITY): Payer: Self-pay | Admitting: *Deleted

## 2015-08-31 DIAGNOSIS — F10239 Alcohol dependence with withdrawal, unspecified: Secondary | ICD-10-CM | POA: Diagnosis not present

## 2015-08-31 DIAGNOSIS — F10220 Alcohol dependence with intoxication, uncomplicated: Secondary | ICD-10-CM

## 2015-08-31 DIAGNOSIS — R000 Tachycardia, unspecified: Secondary | ICD-10-CM | POA: Diagnosis present

## 2015-08-31 DIAGNOSIS — F19239 Other psychoactive substance dependence with withdrawal, unspecified: Secondary | ICD-10-CM

## 2015-08-31 DIAGNOSIS — F10120 Alcohol abuse with intoxication, uncomplicated: Secondary | ICD-10-CM

## 2015-08-31 DIAGNOSIS — F19939 Other psychoactive substance use, unspecified with withdrawal, unspecified: Secondary | ICD-10-CM

## 2015-08-31 DIAGNOSIS — R569 Unspecified convulsions: Secondary | ICD-10-CM

## 2015-08-31 DIAGNOSIS — F1020 Alcohol dependence, uncomplicated: Secondary | ICD-10-CM | POA: Diagnosis not present

## 2015-08-31 LAB — CBC
HEMATOCRIT: 33.4 % — AB (ref 36.0–46.0)
Hemoglobin: 11.4 g/dL — ABNORMAL LOW (ref 12.0–15.0)
MCH: 31.1 pg (ref 26.0–34.0)
MCHC: 34.1 g/dL (ref 30.0–36.0)
MCV: 91 fL (ref 78.0–100.0)
Platelets: 303 10*3/uL (ref 150–400)
RBC: 3.67 MIL/uL — ABNORMAL LOW (ref 3.87–5.11)
RDW: 15.8 % — AB (ref 11.5–15.5)
WBC: 3.5 10*3/uL — AB (ref 4.0–10.5)

## 2015-08-31 LAB — RAPID URINE DRUG SCREEN, HOSP PERFORMED
AMPHETAMINES: NOT DETECTED
BARBITURATES: NOT DETECTED
BENZODIAZEPINES: POSITIVE — AB
Cocaine: NOT DETECTED
Opiates: NOT DETECTED
Tetrahydrocannabinol: NOT DETECTED

## 2015-08-31 LAB — BASIC METABOLIC PANEL
ANION GAP: 9 (ref 5–15)
Anion gap: 9 (ref 5–15)
BUN: 10 mg/dL (ref 6–20)
BUN: 11 mg/dL (ref 6–20)
CALCIUM: 7.5 mg/dL — AB (ref 8.9–10.3)
CHLORIDE: 109 mmol/L (ref 101–111)
CO2: 19 mmol/L — ABNORMAL LOW (ref 22–32)
CO2: 22 mmol/L (ref 22–32)
CREATININE: 0.5 mg/dL (ref 0.44–1.00)
Calcium: 7.5 mg/dL — ABNORMAL LOW (ref 8.9–10.3)
Chloride: 107 mmol/L (ref 101–111)
Creatinine, Ser: 0.42 mg/dL — ABNORMAL LOW (ref 0.44–1.00)
GFR calc Af Amer: >60 mL/min (ref >60)
GFR calc non Af Amer: >60 mL/min (ref >60)
GLUCOSE: 95 mg/dL (ref 65–99)
Glucose, Bld: 118 mg/dL — ABNORMAL HIGH (ref 65–99)
POTASSIUM: 4.2 mmol/L (ref 3.5–5.1)
Potassium: 4 mmol/L (ref 3.5–5.1)
SODIUM: 137 mmol/L (ref 135–145)
SODIUM: 138 mmol/L (ref 135–145)

## 2015-08-31 LAB — MAGNESIUM: MAGNESIUM: 1.8 mg/dL (ref 1.7–2.4)

## 2015-08-31 MED ORDER — HYDROXYZINE HCL 25 MG PO TABS
25.0000 mg | ORAL_TABLET | Freq: Four times a day (QID) | ORAL | Status: DC | PRN
  Administered 2015-08-31 – 2015-09-01 (×3): 25 mg via ORAL
  Filled 2015-08-31 (×4): qty 1

## 2015-08-31 MED ORDER — PANTOPRAZOLE SODIUM 40 MG PO TBEC
40.0000 mg | DELAYED_RELEASE_TABLET | Freq: Every day | ORAL | Status: DC
  Administered 2015-08-31 – 2015-09-01 (×2): 40 mg via ORAL
  Filled 2015-08-31 (×2): qty 1

## 2015-08-31 MED ORDER — DOCUSATE SODIUM 100 MG PO CAPS
100.0000 mg | ORAL_CAPSULE | Freq: Two times a day (BID) | ORAL | Status: DC
  Administered 2015-08-31 – 2015-09-01 (×3): 100 mg via ORAL
  Filled 2015-08-31 (×3): qty 1

## 2015-08-31 MED ORDER — CHLORDIAZEPOXIDE HCL 25 MG PO CAPS
25.0000 mg | ORAL_CAPSULE | Freq: Four times a day (QID) | ORAL | Status: DC | PRN
  Administered 2015-09-01: 25 mg via ORAL
  Filled 2015-08-31 (×2): qty 1

## 2015-08-31 MED ORDER — LAMOTRIGINE 100 MG PO TABS
150.0000 mg | ORAL_TABLET | Freq: Every day | ORAL | Status: DC
  Administered 2015-08-31 – 2015-09-01 (×2): 150 mg via ORAL
  Filled 2015-08-31 (×2): qty 2

## 2015-08-31 MED ORDER — ACETAMINOPHEN 325 MG PO TABS
650.0000 mg | ORAL_TABLET | Freq: Four times a day (QID) | ORAL | Status: DC | PRN
  Administered 2015-08-31 (×2): 650 mg via ORAL
  Filled 2015-08-31 (×2): qty 2

## 2015-08-31 MED ORDER — DIAZEPAM 5 MG/ML IJ SOLN
5.0000 mg | Freq: Four times a day (QID) | INTRAMUSCULAR | Status: DC
  Administered 2015-08-31 (×2): 5 mg via INTRAVENOUS
  Filled 2015-08-31 (×2): qty 2

## 2015-08-31 MED ORDER — LORAZEPAM 2 MG/ML IJ SOLN: 0.0000 mg | Freq: Four times a day (QID) | INTRAMUSCULAR | Status: DC

## 2015-08-31 MED ORDER — CHLORDIAZEPOXIDE HCL 25 MG PO CAPS: 25.0000 mg | ORAL_CAPSULE | Freq: Every day | ORAL | Status: DC

## 2015-08-31 MED ORDER — ENSURE ENLIVE PO LIQD: 237.0000 mL | Freq: Two times a day (BID) | ORAL | Status: DC

## 2015-08-31 MED ORDER — VITAMIN B-1 100 MG PO TABS: 100.0000 mg | ORAL_TABLET | Freq: Every day | ORAL | Status: DC

## 2015-08-31 MED ORDER — VALACYCLOVIR HCL 500 MG PO TABS
1000.0000 mg | ORAL_TABLET | Freq: Every day | ORAL | Status: DC
  Administered 2015-08-31 – 2015-09-01 (×2): 1000 mg via ORAL
  Filled 2015-08-31 (×2): qty 2

## 2015-08-31 MED ORDER — LORAZEPAM 2 MG/ML IJ SOLN: 0.0000 mg | Freq: Two times a day (BID) | INTRAMUSCULAR | Status: DC

## 2015-08-31 MED ORDER — ACETAMINOPHEN 650 MG RE SUPP: 650.0000 mg | Freq: Four times a day (QID) | RECTAL | Status: DC | PRN

## 2015-08-31 MED ORDER — SODIUM CHLORIDE 0.9% FLUSH
3.0000 mL | Freq: Two times a day (BID) | INTRAVENOUS | Status: DC
  Administered 2015-08-31 (×2): 3 mL via INTRAVENOUS

## 2015-08-31 MED ORDER — THIAMINE HCL 100 MG/ML IJ SOLN: 100.0000 mg | Freq: Once | INTRAMUSCULAR | Status: DC

## 2015-08-31 MED ORDER — LORAZEPAM 1 MG PO TABS: 1.0000 mg | ORAL_TABLET | Freq: Four times a day (QID) | ORAL | Status: DC | PRN

## 2015-08-31 MED ORDER — CHLORDIAZEPOXIDE HCL 25 MG PO CAPS: 25.0000 mg | ORAL_CAPSULE | ORAL | Status: DC

## 2015-08-31 MED ORDER — LURASIDONE HCL 40 MG PO TABS
40.0000 mg | ORAL_TABLET | Freq: Every day | ORAL | Status: DC
  Administered 2015-08-31: 40 mg via ORAL
  Filled 2015-08-31: qty 1

## 2015-08-31 MED ORDER — LEVONORGEST-ETH ESTRAD 91-DAY 0.15-0.03 &0.01 MG PO TABS: 1.0000 | ORAL_TABLET | Freq: Every day | ORAL | Status: DC

## 2015-08-31 MED ORDER — LORAZEPAM 2 MG/ML IJ SOLN: 1.0000 mg | Freq: Four times a day (QID) | INTRAMUSCULAR | Status: DC | PRN

## 2015-08-31 MED ORDER — ADULT MULTIVITAMIN W/MINERALS CH
1.0000 | ORAL_TABLET | Freq: Every day | ORAL | Status: DC
  Administered 2015-09-01: 1 via ORAL
  Filled 2015-08-31: qty 1

## 2015-08-31 MED ORDER — ONDANSETRON HCL 4 MG PO TABS: 4.0000 mg | ORAL_TABLET | Freq: Four times a day (QID) | ORAL | Status: DC | PRN

## 2015-08-31 MED ORDER — THIAMINE HCL 100 MG/ML IJ SOLN: 100.0000 mg | Freq: Every day | INTRAMUSCULAR | Status: DC

## 2015-08-31 MED ORDER — VITAMIN B-1 100 MG PO TABS
100.0000 mg | ORAL_TABLET | Freq: Every day | ORAL | Status: DC
Start: 2015-09-01 — End: 2015-09-01
  Administered 2015-09-01: 100 mg via ORAL
  Filled 2015-08-31: qty 1

## 2015-08-31 MED ORDER — GABAPENTIN 300 MG PO CAPS
300.0000 mg | ORAL_CAPSULE | Freq: Two times a day (BID) | ORAL | Status: DC
  Administered 2015-08-31 – 2015-09-01 (×4): 300 mg via ORAL
  Filled 2015-08-31 (×4): qty 1

## 2015-08-31 MED ORDER — CHLORDIAZEPOXIDE HCL 25 MG PO CAPS: 25.0000 mg | ORAL_CAPSULE | Freq: Three times a day (TID) | ORAL | Status: DC

## 2015-08-31 MED ORDER — CHLORDIAZEPOXIDE HCL 25 MG PO CAPS
25.0000 mg | ORAL_CAPSULE | Freq: Four times a day (QID) | ORAL | Status: DC
  Administered 2015-08-31 – 2015-09-01 (×5): 25 mg via ORAL
  Filled 2015-08-31 (×4): qty 1

## 2015-08-31 MED ORDER — NAPHAZOLINE-GLYCERIN 0.012-0.2 % OP SOLN: 1.0000 [drp] | Freq: Four times a day (QID) | OPHTHALMIC | Status: DC | PRN

## 2015-08-31 MED ORDER — ONDANSETRON HCL 4 MG/2ML IJ SOLN: 4.0000 mg | Freq: Four times a day (QID) | INTRAMUSCULAR | Status: DC | PRN

## 2015-08-31 MED ORDER — ADULT MULTIVITAMIN W/MINERALS CH: 1.0000 | ORAL_TABLET | Freq: Every day | ORAL | Status: DC

## 2015-08-31 MED ORDER — THIAMINE HCL 100 MG/ML IJ SOLN
Freq: Once | INTRAVENOUS | Status: AC
  Administered 2015-08-31: 03:00:00 via INTRAVENOUS
  Filled 2015-08-31: qty 1000

## 2015-08-31 MED ORDER — ENOXAPARIN SODIUM 40 MG/0.4ML ~~LOC~~ SOLN
40.0000 mg | SUBCUTANEOUS | Status: DC
  Administered 2015-08-31: 40 mg via SUBCUTANEOUS
  Filled 2015-08-31: qty 0.4

## 2015-08-31 MED ORDER — LOPERAMIDE HCL 2 MG PO CAPS: 2.0000 mg | ORAL_CAPSULE | ORAL | Status: DC | PRN

## 2015-08-31 MED ORDER — ONDANSETRON 4 MG PO TBDP: 4.0000 mg | ORAL_TABLET | Freq: Four times a day (QID) | ORAL | Status: DC | PRN

## 2015-08-31 MED ORDER — METOPROLOL TARTRATE 50 MG PO TABS
50.0000 mg | ORAL_TABLET | Freq: Two times a day (BID) | ORAL | Status: DC
  Administered 2015-08-31 – 2015-09-01 (×4): 50 mg via ORAL
  Filled 2015-08-31 (×4): qty 1

## 2015-08-31 MED ORDER — FOLIC ACID 1 MG PO TABS
1.0000 mg | ORAL_TABLET | Freq: Every day | ORAL | Status: DC
  Administered 2015-09-01: 1 mg via ORAL
  Filled 2015-08-31: qty 1

## 2015-08-31 NOTE — ED Notes (Signed)
Pt sleeping, goes back to sleep before she can answer CIWA questions.

## 2015-08-31 NOTE — Progress Notes (Signed)
[facility] PROGRESS NOTE    Progress Note  [name] Stewart  HFW:[id number] DOB: [birth year] DOA: 08/30/2015 PCP: Verlon Au, MD     Brief Narrative:   [name] Stewart is an 35 y.o. female past medical history of alcohol abuse with multiple admissions for alcohol withdraw that comes in after patient was unable to walk at home.  Assessment/Plan:  Alcohol withdrawal (HCC)/Alcohol use disorder, severe, dependence (HCC) Discontinue Ativan protocol. Start on Librium protocol.  Bipolar disorder with severe depression (HCC) Continue Lamictal Neurontin and Sonata.  Tachycardia Improved.  Leukopenia and anemia: Likely due to alcohol abuse.  DVT prophylaxis: lovenox Family Communication:none Disposition Plan/Barrier to D/C: home inam Code Status:     Code Status Orders        Start     Ordered   08/31/15 0233  Full code  Continuous     08/31/15 0232    Code Status History    Date Active Date Inactive Code Status Order ID Comments User Context   08/13/2015  2:41 PM 08/14/2015  1:09 PM Full Code [id number]  Leroy Sea, MD Inpatient   05/29/2015 11:54 PM 06/07/2015 11:56 AM Full Code [id number]  Michael Litter, MD Inpatient   05/29/2015  9:26 PM 05/29/2015 11:54 PM Full Code [id number]  Loren Racer, MD ED   02/18/2015  5:17 AM 02/19/2015  6:26 PM Full Code [id number]  Beather Arbour, MD Inpatient   02/05/2015  6:32 PM 02/08/2015  5:42 PM Full Code [id number]  Earney Navy, NP Inpatient   02/05/2015  6:32 PM 02/05/2015  6:32 PM Full Code [id number]  Charm Rings, NP Inpatient   02/03/2015  8:59 PM 02/05/2015  6:32 PM Full Code [id number]  Elson Areas, PA-C ED   01/31/2015  2:42 PM 02/01/2015  5:15 PM Full Code [id number]  Su Hoff, MD Inpatient   01/31/2015 11:43 AM 01/31/2015  2:42 PM Full Code [id number]  Wynetta Emery, PA-C ED   12/14/2014  2:37 PM 12/18/2014  1:21 AM Full Code [id number]  Earney Navy, NP Inpatient   12/13/2014  8:57 AM 12/14/2014  2:37 PM Full Code [id number]   Laurence Spates, MD ED   01/01/2014 12:49 AM 01/02/2014  3:23 PM Full Code [id number]  Lynden Oxford, MD ED   12/31/2013  9:16 PM 01/01/2014 12:49 AM Full Code [id number]  Samuel Jester, DO ED        IV Access:    Peripheral IV   Procedures and diagnostic studies:   Ct Head Wo Contrast  Result Date: 08/29/2015 CLINICAL DATA:  The patient was brought to the [facility] ran secondary to severe intoxication. Rib she fell well attempting to go to the restroom and struck the site of her face on a counter. There is no loss of consciousness. EXAM: CT HEAD WITHOUT CONTRAST CT CERVICAL SPINE WITHOUT CONTRAST TECHNIQUE: Multidetector CT imaging of the head and cervical spine was performed following the standard protocol without intravenous contrast. Multiplanar CT image reconstructions of the cervical spine were also generated. COMPARISON:  CT head without contrast 05/29/2015. FINDINGS: CT HEAD FINDINGS Minimal soft tissue swelling is present in the high posterior left parietal scalp without an underlying fracture. Calvarium is intact. The paranasal sinuses and mastoid air cells are clear. No acute infarct, hemorrhage, or mass lesion is present. No significant white matter changes are seen. The ventricles are of normal size. There is no significant extra-axial fluid collection. CT CERVICAL SPINE FINDINGS The cervical spine is imaged from the  skullbase through T1-2. Vertebral body heights and alignment are maintained. There is some straightening of the normal cervical lordosis. No acute fracture or traumatic subluxation is present. The lung apices are clear. The soft tissues of the neck are unremarkable. IMPRESSION: 1. Essentially negative CT of the head. Minimal soft tissue swelling may be present. No acute intracranial abnormality. 2. Negative CT of the cervical spine. Electronically Signed   By: Marin Roberts M.D.   On: 08/29/2015 18:57   Ct Cervical Spine Wo Contrast  Result Date: 08/29/2015 CLINICAL  DATA:  The patient was brought to the [facility] ran secondary to severe intoxication. Rib she fell well attempting to go to the restroom and struck the site of her face on a counter. There is no loss of consciousness. EXAM: CT HEAD WITHOUT CONTRAST CT CERVICAL SPINE WITHOUT CONTRAST TECHNIQUE: Multidetector CT imaging of the head and cervical spine was performed following the standard protocol without intravenous contrast. Multiplanar CT image reconstructions of the cervical spine were also generated. COMPARISON:  CT head without contrast 05/29/2015. FINDINGS: CT HEAD FINDINGS Minimal soft tissue swelling is present in the high posterior left parietal scalp without an underlying fracture. Calvarium is intact. The paranasal sinuses and mastoid air cells are clear. No acute infarct, hemorrhage, or mass lesion is present. No significant white matter changes are seen. The ventricles are of normal size. There is no significant extra-axial fluid collection. CT CERVICAL SPINE FINDINGS The cervical spine is imaged from the skullbase through T1-2. Vertebral body heights and alignment are maintained. There is some straightening of the normal cervical lordosis. No acute fracture or traumatic subluxation is present. The lung apices are clear. The soft tissues of the neck are unremarkable. IMPRESSION: 1. Essentially negative CT of the head. Minimal soft tissue swelling may be present. No acute intracranial abnormality. 2. Negative CT of the cervical spine. Electronically Signed   By: Marin Roberts M.D.   On: 08/29/2015 18:57     Medical Consultants:    None.  Anti-Infectives:   none  Subjective:    [name] Stewart she relates she continues to shake and is having tactile hallucinations.  Objective:    Vitals:   08/31/15 0045 08/31/15 0206 08/31/15 0233 08/31/15 0430  BP:  116/77 98/61 (!) 124/93  Pulse: 117 111 (!) 112 100  Resp:  17 16 16   Temp:   99 F (37.2 C) 99.3 F (37.4 C)  TempSrc:   Oral Oral    SpO2:  94% 94% 98%  Weight:   67.7 kg (149 lb 3.2 oz)   Height:   5\' 4"  (1.626 m)     Intake/Output Summary (Last 24 hours) at 08/31/15 0942 Last data filed at 08/31/15 0859  Gross per 24 hour  Intake                0 ml  Output              751 ml  Net             -751 ml   Filed Weights   08/31/15 0233  Weight: 67.7 kg (149 lb 3.2 oz)    Exam: General exam: In no acute distress. Respiratory system: Good air movement and clear to auscultation. Cardiovascular system: S1 & S2 heard, RRR.  Gastrointestinal system: Abdomen is nondistended, soft and nontender.  Central nervous system: Alert and oriented. No focal neurological deficits. Extremities: No pedal edema. Skin: No rashes, lesions or ulcers Psychiatry: Judgement and insight appear normal.  Mood & affect appropriate.    Data Reviewed:    Labs: Basic Metabolic Panel:  Recent Labs Lab 08/30/15 2045 08/31/15 0002 08/31/15 0533  NA 139 138 137  K 5.8* 4.0 4.2  CL 102 107 109  CO2  --  22 19*  GLUCOSE 81 118* 95  BUN CREATININE 1.10* 0.50 0.42*  CALCIUM  --  7.5* 7.5*  MG  --  1.8  --    GFR Estimated Creatinine Clearance: 92.8 mL/min (by C-G formula based on SCr of 0.8 mg/dL). Liver Function Tests: No results for input(s): AST, ALT, ALKPHOS, BILITOT, PROT, ALBUMIN in the last 168 hours. No results for input(s): LIPASE, AMYLASE in the last 168 hours. No results for input(s): AMMONIA in the last 168 hours. Coagulation profile No results for input(s): INR, PROTIME in the last 168 hours.  CBC:  Recent Labs Lab 08/30/15 2045 08/31/15 0533  WBC  --  3.5*  HGB 16.0* 11.4*  HCT 47.0* 33.4*  MCV  --  91.0  PLT  --  303   Cardiac Enzymes: No results for input(s): CKTOTAL, CKMB, CKMBINDEX, TROPONINI in the last 168 hours. BNP (last 3 results) No results for input(s): PROBNP in the last 8760 hours. CBG: No results for input(s): GLUCAP in the last 168 hours. D-Dimer: No results for input(s):  DDIMER in the last 72 hours. Hgb A1c: No results for input(s): HGBA1C in the last 72 hours. Lipid Profile: No results for input(s): CHOL, HDL, LDLCALC, TRIG, CHOLHDL, LDLDIRECT in the last 72 hours. Thyroid function studies: No results for input(s): TSH, T4TOTAL, T3FREE, THYROIDAB in the last 72 hours.  Invalid input(s): FREET3 Anemia work up: No results for input(s): VITAMINB12, FOLATE, FERRITIN, TIBC, IRON, RETICCTPCT in the last 72 hours. Sepsis Labs:  Recent Labs Lab 08/31/15 0533  WBC 3.5*   Microbiology No results found for this or any previous visit (from the past 240 hour(s)).   Medications:   . diazepam  5 mg Intravenous Q6H  . docusate sodium  100 mg Oral BID  . enoxaparin (LOVENOX) injection  40 mg Subcutaneous Q24H  . feeding supplement (ENSURE ENLIVE)  237 mL Oral BID BM  . [START ON 09/01/2015] folic acid  1 mg Oral Daily  . gabapentin  300 mg Oral BID  . lamoTRIgine  150 mg Oral Daily  . Levonorgestrel-Ethinyl Estradiol  1 tablet Oral Daily  . lurasidone  40 mg Oral QHS  . metoprolol  50 mg Oral BID  . [START ON 09/01/2015] multivitamin with minerals  1 tablet Oral Daily  . pantoprazole  40 mg Oral Daily  . sodium chloride flush  3 mL Intravenous Q12H  . [START ON 09/01/2015] thiamine  100 mg Oral Daily   Or  . [START ON 09/01/2015] thiamine  100 mg Intravenous Daily  . valACYclovir  1,000 mg Oral Daily   Continuous Infusions:   Time spent: 25 min   LOS: 0 days   Marinda Elk  [facility] Pager [phone]  *Please refer to [email or web address], password [email or web address] to get updated schedule on who will round on this patient, as hospitalists switch teams weekly. If 7PM-7AM, please contact night-coverage at [email or web address], password [email or web address] for any overnight needs.  08/31/2015, 9:42 AM

## 2015-08-31 NOTE — H&P (Signed)
History and Physical    [name] Stewart WJX:[id number] DOB: [birth year] DOA: 08/30/2015  PCP: Verlon Au, MD Consultants:  ? Patient coming from: home?  Chief Complaint: alcohol intoxication  HPI: [name] Stewart is a 35 y.o. female with medical history significant of alcoholism including dependence, withdrawal, and seizures; she was also admitted on 7/17 among other similar presentations.  She presented by EMS tonight after family called for a welfare check; EMS reported that she was unable to stand or walk at home.  Has h/o depression and bipolar, taking Latuda.   ED Course: Patient monitored for approximately 9 hours, given IVF and Ativan.  Has persistent tachycardia (which is usual for her in this situation), remains unsteady and confused.  Concern for withdrawal.  Review of Systems: As per HPI; otherwise 10 point review of systems reviewed and negative.   Ambulatory Status:  Ambulates without assistance when not intoxicated  Past Medical History:  Diagnosis Date  . Alcohol related seizure (HCC)   . Alcoholic (HCC)   . Anxiety   . Bipolar disorder (HCC)   . Depression   . Post traumatic stress disorder (PTSD)   . Tension headache, chronic    "monthly" (01/31/2015)    Past Surgical History:  Procedure Laterality Date  . EXCISIONAL HEMORRHOIDECTOMY  20111  . OSTEOCHONDROMA EXCISION Right ~ 1996   "knee"  . REFRACTIVE SURGERY Bilateral ~ 2011    Social History   Social History  . Marital status: Divorced    Spouse name: N/A  . Number of children: N/A  . Years of education: N/A   Occupational History  . Not on file.   Social History Main Topics  . Smoking status: Never Smoker  . Smokeless tobacco: Never Used  . Alcohol use Yes     Comment: Wine and Vodka. Last drink: Today  . Drug use:   . Sexual activity: Not Currently   Other Topics Concern  . Not on file   Social History Narrative   Reports she lives with a sober roommate.     Allergies  Allergen  Reactions  . Naltrexone Other (See Comments)    Makes her hurt everywhere. Anything higher than 25mg  gives her the issues.. Can take in smaller doses  . Baclofen Rash    Family History  Problem Relation Age of Onset  . Anxiety disorder Mother   . Anxiety disorder Sister   . Anxiety disorder Maternal Grandmother   . Anxiety disorder Father   . Alcohol abuse Father   . Alcohol abuse Brother   . Alcohol abuse Paternal Uncle     Prior to Admission medications   Medication Sig Start Date End Date Taking? Authorizing Provider  gabapentin (NEURONTIN) 300 MG capsule Take 300 mg by mouth 2 (two) times daily.  08/23/15  Yes Historical Provider, MD  lamoTRIgine (LAMICTAL) 150 MG tablet Take 150 mg by mouth daily.   Yes Historical Provider, MD  Levonorgestrel-Ethinyl Estradiol (CAMRESE) 0.15-0.03 &0.01 MG tablet Take 1 tablet by mouth daily.   Yes Historical Provider, MD  lurasidone (LATUDA) 40 MG TABS tablet Take 40 mg by mouth at bedtime.    Yes Historical Provider, MD  Melatonin 3 MG TABS Take 9 mg by mouth at bedtime.   Yes Historical Provider, MD  metoprolol tartrate (LOPRESSOR) 50 MG tablet Take 1 tablet (50 mg total) by mouth 2 (two) times daily. 08/14/15  Yes Leroy Sea, MD  Multiple Vitamin (MULTIVITAMIN WITH MINERALS) TABS tablet Take 1 tablet by mouth daily.  02/19/15  Yes Carly Arlyce Harman, MD  pantoprazole (PROTONIX) 40 MG tablet Take 1 tablet (40 mg total) by mouth daily. 08/14/15  Yes Leroy Sea, MD  thiamine 100 MG tablet Take 1 tablet (100 mg total) by mouth daily. 02/19/15  Yes Carly Arlyce Harman, MD  zaleplon (SONATA) 10 MG capsule Take 10 mg by mouth at bedtime as needed for sleep.  08/23/15  Yes Historical Provider, MD  chlordiazePOXIDE (LIBRIUM) 5 MG capsule Take 5 mg by mouth 3 (three) times daily as needed for anxiety or withdrawal.  08/24/15   Historical Provider, MD  diazepam (VALIUM) 5 MG tablet Take 5 mg by mouth every 6 (six) hours as needed for anxiety.  06/26/15    Historical Provider, MD  folic acid (FOLVITE) 1 MG tablet Take 1 tablet (1 mg total) by mouth daily. Patient not taking: Reported on 08/13/2015 02/19/15   Su Hoff, MD  ketorolac (TORADOL) 10 MG tablet Take 10 mg by mouth 2 (two) times daily as needed (pain.).    Historical Provider, MD  naphazoline-glycerin (CLEAR EYES) 0.012-0.2 % SOLN Place 1 drop into both eyes 4 (four) times daily as needed for irritation.    Historical Provider, MD  ondansetron (ZOFRAN) 4 MG tablet Take 4 mg by mouth every 8 (eight) hours as needed for nausea or vomiting.    Historical Provider, MD  valACYclovir (VALTREX) 500 MG tablet Take 1,000 mg by mouth daily. 07/12/15   Historical Provider, MD    Physical Exam: Vitals:   08/31/15 0011 08/31/15 0045 08/31/15 0206 08/31/15 0233  BP: 108/70  116/77 98/61  Pulse: (!) 135 117 111 (!) 112  Resp: Temp:    99 F (37.2 C)  TempSrc:    Oral  SpO2: 95%  94% 94%  Weight:    67.7 kg (149 lb 3.2 oz)  Height:     (1.626 m)     General: Appears calm and comfortable and is NAD; she is quite somnolent and minimally responsive to questions but is cooperative Eyes:  PERRL, EOMI, normal lids, iris ENT:  grossly normal hearing, lips & tongue, mmm Neck:  no LAD, masses or thyromegaly Cardiovascular:  RRR, no m/r/g. No LE edema.  Respiratory:  CTA bilaterally, no w/r/r. Normal respiratory effort. Abdomen:  soft, ntnd, NABS Skin:  no rash or induration seen on limited exam Musculoskeletal:  grossly normal tone BUE/BLE, good ROM, no bony abnormality Psychiatric:  Somnolent, mild ongoing slurring of words, poor insight  Labs on Admission: I have personally reviewed following labs and imaging studies  CBC:  Recent Labs Lab 08/30/15 2045  HGB 16.0*  HCT 47.0*   Basic Metabolic Panel:  Recent Labs Lab 08/30/15 2045 08/31/15 0002  NA 139 138  K 5.8* 4.0  CL 102 107  CO2  --  22  GLUCOSE 81 118*  BUN 7 10  CREATININE 1.10* 0.50  CALCIUM  --   7.5*  MG  --  1.8   GFR: Estimated Creatinine Clearance: 92.8 mL/min (by C-G formula based on SCr of 0.8 mg/dL). Liver Function Tests: No results for input(s): AST, ALT, ALKPHOS, BILITOT, PROT, ALBUMIN in the last 168 hours. No results for input(s): LIPASE, AMYLASE in the last 168 hours. No results for input(s): AMMONIA in the last 168 hours. Coagulation Profile: No results for input(s): INR, PROTIME in the last 168 hours. Cardiac Enzymes: No results for input(s): CKTOTAL, CKMB, CKMBINDEX, TROPONINI in the last 168 hours. BNP (  last 3 results) No results for input(s): PROBNP in the last 8760 hours. HbA1C: No results for input(s): HGBA1C in the last 72 hours. CBG: No results for input(s): GLUCAP in the last 168 hours. Lipid Profile: No results for input(s): CHOL, HDL, LDLCALC, TRIG, CHOLHDL, LDLDIRECT in the last 72 hours. Thyroid Function Tests: No results for input(s): TSH, T4TOTAL, FREET4, T3FREE, THYROIDAB in the last 72 hours. Anemia Panel: No results for input(s): VITAMINB12, FOLATE, FERRITIN, TIBC, IRON, RETICCTPCT in the last 72 hours. Urine analysis:    Component Value Date/Time   COLORURINE YELLOW 08/13/2015 1303   APPEARANCEUR CLOUDY (A) 08/13/2015 1303   LABSPEC 1.021 08/13/2015 1303   PHURINE 5.0 08/13/2015 1303   GLUCOSEU NEGATIVE 08/13/2015 1303   HGBUR MODERATE (A) 08/13/2015 1303   BILIRUBINUR NEGATIVE 08/13/2015 1303   KETONESUR >80 (A) 08/13/2015 1303   PROTEINUR >300 (A) 08/13/2015 1303   UROBILINOGEN 0.2 12/31/2013 2136   NITRITE NEGATIVE 08/13/2015 1303   LEUKOCYTESUR NEGATIVE 08/13/2015 1303    Creatinine Clearance: Estimated Creatinine Clearance: 92.8 mL/min (by C-G formula based on SCr of 0.8 mg/dL).  Sepsis Labs: @LABRCNTIP (procalcitonin:4,lacticidven:4) )No results found for this or any previous visit (from the past 240 hour(s)).   Radiological Exams on Admission: Ct Head Wo Contrast  Result Date: 08/29/2015 CLINICAL DATA:  The patient was  brought to the [facility] ran secondary to severe intoxication. Rib she fell well attempting to go to the restroom and struck the site of her face on a counter. There is no loss of consciousness. EXAM: CT HEAD WITHOUT CONTRAST CT CERVICAL SPINE WITHOUT CONTRAST TECHNIQUE: Multidetector CT imaging of the head and cervical spine was performed following the standard protocol without intravenous contrast. Multiplanar CT image reconstructions of the cervical spine were also generated. COMPARISON:  CT head without contrast 05/29/2015. FINDINGS: CT HEAD FINDINGS Minimal soft tissue swelling is present in the high posterior left parietal scalp without an underlying fracture. Calvarium is intact. The paranasal sinuses and mastoid air cells are clear. No acute infarct, hemorrhage, or mass lesion is present. No significant white matter changes are seen. The ventricles are of normal size. There is no significant extra-axial fluid collection. CT CERVICAL SPINE FINDINGS The cervical spine is imaged from the skullbase through T1-2. Vertebral body heights and alignment are maintained. There is some straightening of the normal cervical lordosis. No acute fracture or traumatic subluxation is present. The lung apices are clear. The soft tissues of the neck are unremarkable. IMPRESSION: 1. Essentially negative CT of the head. Minimal soft tissue swelling may be present. No acute intracranial abnormality. 2. Negative CT of the cervical spine. Electronically Signed   By: Marin Roberts M.D.   On: 08/29/2015 18:57   Ct Cervical Spine Wo Contrast  Result Date: 08/29/2015 CLINICAL DATA:  The patient was brought to the [facility] ran secondary to severe intoxication. Rib she fell well attempting to go to the restroom and struck the site of her face on a counter. There is no loss of consciousness. EXAM: CT HEAD WITHOUT CONTRAST CT CERVICAL SPINE WITHOUT CONTRAST TECHNIQUE: Multidetector CT imaging of the head and cervical spine was performed  following the standard protocol without intravenous contrast. Multiplanar CT image reconstructions of the cervical spine were also generated. COMPARISON:  CT head without contrast 05/29/2015. FINDINGS: CT HEAD FINDINGS Minimal soft tissue swelling is present in the high posterior left parietal scalp without an underlying fracture. Calvarium is intact. The paranasal sinuses and mastoid air cells are clear. No acute infarct, hemorrhage,  or mass lesion is present. No significant white matter changes are seen. The ventricles are of normal size. There is no significant extra-axial fluid collection. CT CERVICAL SPINE FINDINGS The cervical spine is imaged from the skullbase through T1-2. Vertebral body heights and alignment are maintained. There is some straightening of the normal cervical lordosis. No acute fracture or traumatic subluxation is present. The lung apices are clear. The soft tissues of the neck are unremarkable. IMPRESSION: 1. Essentially negative CT of the head. Minimal soft tissue swelling may be present. No acute intracranial abnormality. 2. Negative CT of the cervical spine. Electronically Signed   By: Marin Roberts M.D.   On: 08/29/2015 18:57    EKG: Not done  Assessment/Plan Principal Problem:   Alcohol withdrawal (HCC) Active Problems:   Alcohol use disorder, severe, dependence (HCC)   Bipolar disorder with severe depression (HCC)   Alcohol intoxication (HCC)   Withdrawal seizures (HCC)   Alcoholism -ETOH still 332 at 2039 -Labs that were abnormal on Istat at 2045 (hyperkalemia, AKI) have normalized with BMP at 0002 -Persistent tachycardia -Patient presented similarly on 7/17, was admitted to SDU, improved quickly, declined rehab -Also with ER visit on 8/2 for similar -Will place in observation status on telemetry to monitor ongoing tachycardia -Had mild transaminitis during last admission, not checked tonight; will add LFTs to AM blood draw -SW consult requested -Will  place on CIWA protocol with standing Valium q6h and prn Ativan -Seizure precautions, continue Lamictal and Neurontin  Bipolar .-Patient prescribed Librium, Valium, Lamictal, Latuda, Neurontin and Sonata at home -Suspect that a large component of her mood disorder is related to her ongoing substance dependence and that it would be difficult to tease out which is causing/worsening which -Encouraged ETOH cessation and her mood is likely to improve -Would recommend caution with outpatient medications -Consider psychiatry consultation/inpatient treatment as she is obviously a danger to herself  Tachycardia -Tends to be present at all visits -Started on Lopressor during prior hospitalization -Uncertain about compliance with medication, so some of current tachycardia could be rebound -Will continue PO beta blocker, continue to treat for possible ETOH withdrawal   DVT prophylaxis: Lovenox Code Status:  Full  Family Communication: None present  Disposition Plan:  Home once clinically improved vs. Psych/rehab Consults called: None  Admission status: Observation - telemetry    Jonah Blue MD [facility]  If 7PM-7AM, please contact night-coverage [email or web address] Password [email or web address]  08/31/2015, 2:51 AM

## 2015-08-31 NOTE — ED Provider Notes (Addendum)
[facility]-EMERGENCY DEPT Provider Note   CSN: [id number] Arrival date & time: 08/30/15  1617  First Provider Contact:  First MD Initiated Contact with Patient 08/30/15 1620        History   Chief Complaint Chief Complaint  Patient presents with  . Alcohol Intoxication    HPI [name] Stewart is a 35 y.o. female. She has a history of alcoholism. She presents today after family called about her well-being. Brought in for evaluation by EMS that she was unable to stand or walk at home.  Relapse over the last month or so apparently. Admitted here on the 18th with alcohol intoxication tachycardia and withdraw. Did better with Librium and IV fluids. History of depression and bipolar disorder. Not currently treated or medicated.    HPI  Past Medical History:  Diagnosis Date  . Alcohol related seizure (HCC)   . Alcoholic (HCC)   . Anxiety   . Bipolar disorder (HCC)   . Depression   . Post traumatic stress disorder (PTSD)   . Tension headache, chronic    "monthly" (01/31/2015)    Patient Active Problem List   Diagnosis Date Noted  . Alcohol intoxication (HCC) 08/13/2015  . Alcohol dependence (HCC) 05/29/2015  . Depression 05/29/2015  . Alcohol dependence with uncomplicated withdrawal (HCC)   . Generalized anxiety disorder   . Bipolar disorder in full remission (HCC)   . Transaminitis 02/17/2015  . Major depressive disorder, recurrent episode, severe (HCC) 02/05/2015  . PTSD (post-traumatic stress disorder) 12/14/2014  . Bipolar disorder with severe depression (HCC) 12/14/2014  . Alcohol use disorder, severe, dependence (HCC) 12/13/2014  . Metabolic acidosis 01/01/2014  . Alcohol withdrawal (HCC) 01/01/2014  . Alcohol abuse 01/01/2014  . GERD (gastroesophageal reflux disease) 01/01/2014  . Coarse tremors 01/01/2014    Past Surgical History:  Procedure Laterality Date  . EXCISIONAL HEMORRHOIDECTOMY  20111  . OSTEOCHONDROMA EXCISION Right ~ 1996   "knee"  . REFRACTIVE SURGERY  Bilateral ~ 2011    OB History    No data available       Home Medications    Prior to Admission medications   Medication Sig Start Date End Date Taking? Authorizing Provider  gabapentin (NEURONTIN) 300 MG capsule Take 300 mg by mouth 2 (two) times daily.  08/23/15  Yes Historical Provider, MD  lamoTRIgine (LAMICTAL) 150 MG tablet Take 150 mg by mouth daily.   Yes Historical Provider, MD  Levonorgestrel-Ethinyl Estradiol (CAMRESE) 0.15-0.03 &0.01 MG tablet Take 1 tablet by mouth daily.   Yes Historical Provider, MD  lurasidone (LATUDA) 40 MG TABS tablet Take 40 mg by mouth at bedtime.    Yes Historical Provider, MD  Melatonin 3 MG TABS Take 9 mg by mouth at bedtime.   Yes Historical Provider, MD  metoprolol tartrate (LOPRESSOR) 50 MG tablet Take 1 tablet (50 mg total) by mouth 2 (two) times daily. 08/14/15  Yes Leroy Sea, MD  Multiple Vitamin (MULTIVITAMIN WITH MINERALS) TABS tablet Take 1 tablet by mouth daily. 02/19/15  Yes Carly Arlyce Harman, MD  pantoprazole (PROTONIX) 40 MG tablet Take 1 tablet (40 mg total) by mouth daily. 08/14/15  Yes Leroy Sea, MD  thiamine 100 MG tablet Take 1 tablet (100 mg total) by mouth daily. 02/19/15  Yes Carly Arlyce Harman, MD  zaleplon (SONATA) 10 MG capsule Take 10 mg by mouth at bedtime as needed for sleep.  08/23/15  Yes Historical Provider, MD  chlordiazePOXIDE (LIBRIUM) 5 MG capsule Take 5 mg by mouth 3 (  three) times daily as needed for anxiety or withdrawal.  08/24/15   Historical Provider, MD  diazepam (VALIUM) 5 MG tablet Take 5 mg by mouth every 6 (six) hours as needed for anxiety.  06/26/15   Historical Provider, MD  folic acid (FOLVITE) 1 MG tablet Take 1 tablet (1 mg total) by mouth daily. Patient not taking: Reported on 08/13/2015 02/19/15   Su Hoff, MD  hydrOXYzine (ATARAX/VISTARIL) 25 MG tablet Take 1 tablet (25 mg total) by mouth every 4 (four) hours as needed for anxiety. Patient not taking: Reported on 08/13/2015 02/08/15   Adonis Brook, NP  ketorolac (TORADOL) 10 MG tablet Take 10 mg by mouth 2 (two) times daily as needed (pain.).    Historical Provider, MD  lamoTRIgine (LAMICTAL) 25 MG tablet Take 1 tablet (25 mg total) by mouth daily. Patient not taking: Reported on 08/13/2015 02/08/15   Adonis Brook, NP  lurasidone (LATUDA) 80 MG TABS tablet Take 1 tablet (80 mg total) by mouth at bedtime. Patient not taking: Reported on 06/18/2015 02/08/15   Adonis Brook, NP  naphazoline-glycerin (CLEAR EYES) 0.012-0.2 % SOLN Place 1 drop into both eyes 4 (four) times daily as needed for irritation.    Historical Provider, MD  ondansetron (ZOFRAN) 4 MG tablet Take 4 mg by mouth every 8 (eight) hours as needed for nausea or vomiting.    Historical Provider, MD  valACYclovir (VALTREX) 500 MG tablet Take 1,000 mg by mouth daily. 07/12/15   Historical Provider, MD    Family History Family History  Problem Relation Age of Onset  . Anxiety disorder Mother   . Anxiety disorder Sister   . Anxiety disorder Maternal Grandmother   . Anxiety disorder Father   . Alcohol abuse Father   . Alcohol abuse Brother   . Alcohol abuse Paternal Uncle     Social History Social History  Substance Use Topics  . Smoking status: Never Smoker  . Smokeless tobacco: Never Used  . Alcohol use Yes     Comment: Wine and Vodka. Last drink: Today     Allergies   Naltrexone and Baclofen   Review of Systems Review of Systems  Unable to perform ROS: Other     Physical Exam Updated Vital Signs BP 108/70   Pulse (!) 135   Temp 98.1 F (36.7 C) (Oral)   Resp 19   SpO2 95%   Physical Exam  Constitutional: She is oriented to person, place, and time. She appears well-developed and well-nourished. No distress.  HENT:  Head: Normocephalic.  Eyes: Conjunctivae are normal. Pupils are equal, round, and reactive to light. No scleral icterus.  Neck: Normal range of motion. Neck supple. No thyromegaly present.  Cardiovascular: Regular rhythm.   Tachycardia present.  Exam reveals no gallop and no friction rub.   No murmur heard. Sinus tachycardia.  Pulmonary/Chest: Effort normal and breath sounds normal. No respiratory distress. She has no wheezes. She has no rales.  Abdominal: Soft. Bowel sounds are normal. She exhibits no distension. There is no tenderness. There is no rebound.  Musculoskeletal: Normal range of motion.  Neurological: She is alert and oriented to person, place, and time.  Skin: Skin is warm and dry. No rash noted.  Psychiatric: She has a normal mood and affect. She is slowed and withdrawn.  Unstable attempting to stand or walk.     ED Treatments / Results  Labs (all labs ordered are listed, but only abnormal results are displayed) Labs Reviewed  ETHANOL -  Abnormal; Notable for the following:       Result Value   Alcohol, Ethyl (B) 332 (*)    All other components within normal limits  I-STAT CHEM 8, ED - Abnormal; Notable for the following:    Potassium 5.8 (*)    Creatinine, Ser 1.10 (*)    Calcium, Ion 0.91 (*)    Hemoglobin 16.0 (*)    HCT 47.0 (*)    All other components within normal limits  URINE RAPID DRUG SCREEN, HOSP PERFORMED  BASIC METABOLIC PANEL  MAGNESIUM    EKG  EKG Interpretation None       Radiology Ct Head Wo Contrast  Result Date: 08/29/2015 CLINICAL DATA:  The patient was brought to the [facility] ran secondary to severe intoxication. Rib she fell well attempting to go to the restroom and struck the site of her face on a counter. There is no loss of consciousness. EXAM: CT HEAD WITHOUT CONTRAST CT CERVICAL SPINE WITHOUT CONTRAST TECHNIQUE: Multidetector CT imaging of the head and cervical spine was performed following the standard protocol without intravenous contrast. Multiplanar CT image reconstructions of the cervical spine were also generated. COMPARISON:  CT head without contrast 05/29/2015. FINDINGS: CT HEAD FINDINGS Minimal soft tissue swelling is present in the high posterior  left parietal scalp without an underlying fracture. Calvarium is intact. The paranasal sinuses and mastoid air cells are clear. No acute infarct, hemorrhage, or mass lesion is present. No significant white matter changes are seen. The ventricles are of normal size. There is no significant extra-axial fluid collection. CT CERVICAL SPINE FINDINGS The cervical spine is imaged from the skullbase through T1-2. Vertebral body heights and alignment are maintained. There is some straightening of the normal cervical lordosis. No acute fracture or traumatic subluxation is present. The lung apices are clear. The soft tissues of the neck are unremarkable. IMPRESSION: 1. Essentially negative CT of the head. Minimal soft tissue swelling may be present. No acute intracranial abnormality. 2. Negative CT of the cervical spine. Electronically Signed   By: Marin Roberts M.D.   On: 08/29/2015 18:57   Ct Cervical Spine Wo Contrast  Result Date: 08/29/2015 CLINICAL DATA:  The patient was brought to the [facility] ran secondary to severe intoxication. Rib she fell well attempting to go to the restroom and struck the site of her face on a counter. There is no loss of consciousness. EXAM: CT HEAD WITHOUT CONTRAST CT CERVICAL SPINE WITHOUT CONTRAST TECHNIQUE: Multidetector CT imaging of the head and cervical spine was performed following the standard protocol without intravenous contrast. Multiplanar CT image reconstructions of the cervical spine were also generated. COMPARISON:  CT head without contrast 05/29/2015. FINDINGS: CT HEAD FINDINGS Minimal soft tissue swelling is present in the high posterior left parietal scalp without an underlying fracture. Calvarium is intact. The paranasal sinuses and mastoid air cells are clear. No acute infarct, hemorrhage, or mass lesion is present. No significant white matter changes are seen. The ventricles are of normal size. There is no significant extra-axial fluid collection. CT CERVICAL SPINE  FINDINGS The cervical spine is imaged from the skullbase through T1-2. Vertebral body heights and alignment are maintained. There is some straightening of the normal cervical lordosis. No acute fracture or traumatic subluxation is present. The lung apices are clear. The soft tissues of the neck are unremarkable. IMPRESSION: 1. Essentially negative CT of the head. Minimal soft tissue swelling may be present. No acute intracranial abnormality. 2. Negative CT of the cervical spine. Electronically Signed  By: Marin Roberts M.D.   On: 08/29/2015 18:57    Procedures Procedures (including critical care time)  Medications Ordered in ED Medications  LORazepam (ATIVAN) injection 2 mg (2 mg Intravenous Given 08/30/15 2044)  LORazepam (ATIVAN) tablet 2 mg (not administered)  LORazepam (ATIVAN) injection 1 mg (not administered)  sodium chloride 0.9 % bolus 2,000 mL (0 mLs Intravenous Stopped 08/30/15 2314)  thiamine (B-1) injection 100 mg (100 mg Intravenous Given 08/31/15 0000)  sodium chloride 0.9 % bolus 1,000 mL (1,000 mLs Intravenous New Bag/Given 08/30/15 2359)     Initial Impression / Assessment and Plan / ED Course  I have reviewed the triage vital signs and the nursing notes.  Pertinent labs & imaging results that were available during my care of the patient were reviewed by me and considered in my medical decision making (see chart for details).  Clinical Course  Value Comment By Time  Potassium: (!) 5.8 (Reviewed) Rolland Porter, MD 08/03 2209  Potassium: (!) 5.8 (Reviewed) Rolland Porter, MD 08/03 2210  Potassium: (!) 5.8 (Reviewed) Rolland Porter, MD 08/03 2210    Patient observed initially. Was given fluids and Ativan for tachycardia. Heart rate continues to be elevated and 130s to 140s. Given additional fluids and Ativan. Labs obtained and pending. Patient remains unstable on her feet and somewhat confused and believes she is very likely and some early withdrawal of the [place], level is still quite  high. Will require admission.  Final Clinical Impressions(s) / ED Diagnoses   Final diagnoses:  Alcohol intoxication, uncomplicated (HCC)  Alcohol withdrawal, uncomplicated [facility])    New Prescriptions New Prescriptions   No medications on file     Rolland Porter, MD 08/31/15 0025    Rolland Porter, MD 08/31/15 [phone]

## 2015-09-01 DIAGNOSIS — F314 Bipolar disorder, current episode depressed, severe, without psychotic features: Secondary | ICD-10-CM | POA: Diagnosis not present

## 2015-09-01 DIAGNOSIS — F10230 Alcohol dependence with withdrawal, uncomplicated: Secondary | ICD-10-CM | POA: Diagnosis not present

## 2015-09-01 DIAGNOSIS — F10239 Alcohol dependence with withdrawal, unspecified: Secondary | ICD-10-CM

## 2015-09-01 DIAGNOSIS — F1020 Alcohol dependence, uncomplicated: Secondary | ICD-10-CM

## 2015-09-01 DIAGNOSIS — R000 Tachycardia, unspecified: Secondary | ICD-10-CM

## 2015-09-01 NOTE — Discharge Summary (Signed)
Physician Discharge Summary  [name] Stewart ZOX:[id number] DOB: 12-15-1980 DOA: 08/30/2015  PCP: Verlon Au, MD  Admit date: 08/30/2015 Discharge date: 09/01/2015  Admitted From: home Disposition:  Home  Recommendations for Outpatient Follow-up:  1. Follow up with Psyquatrist in 1-2 weeks   Home Health:no Equipment/Devices:no  Discharge Condition:stable CODE STATUS:full Diet recommendation: Heart Healthy   Brief/Interim Summary: 35 year old female with past medical history significant for alcoholism and withdraws seizures comes in as a patient was unable to stand or walk. In the ED she remained for 9 hours with IV fluid and Ativan with persistent tachycardia unsteady on her feet and confused.  Discharge Diagnoses:  Alcohol withdrawal (HCC)Alcohol use disorder, severe, dependence (HCC): Patient was started on a Librium protocol was started on IV thiamine and folate. By the next day she was much improved but she continued to be tachycardic. The following day her tachycardia resolves and she was discharged in stable condition.  Bipolar disorder with severe depression (HCC): Stable no changes made.  Tachycardia Due to withdrawal, resolved.   Discharge Instructions  Discharge Instructions    Diet - low sodium heart healthy    Complete by:  As directed   Increase activity slowly    Complete by:  As directed       Medication List    STOP taking these medications   diazepam 5 MG tablet Commonly known as:  VALIUM   ketorolac 10 MG tablet Commonly known as:  TORADOL     TAKE these medications   CAMRESE 0.15-0.03 &0.01 MG tablet Generic drug:  Levonorgestrel-Ethinyl Estradiol Take 1 tablet by mouth daily.   chlordiazePOXIDE 5 MG capsule Commonly known as:  LIBRIUM Take 5 mg by mouth 3 (three) times daily as needed for anxiety or withdrawal.   folic acid 1 MG tablet Commonly known as:  FOLVITE Take 1 tablet (1 mg total) by mouth daily.   gabapentin 300 MG  capsule Commonly known as:  NEURONTIN Take 300 mg by mouth 2 (two) times daily.   lamoTRIgine 150 MG tablet Commonly known as:  LAMICTAL Take 150 mg by mouth daily.   lurasidone 40 MG Tabs tablet Commonly known as:  LATUDA Take 40 mg by mouth at bedtime.   Melatonin 3 MG Tabs Take 9 mg by mouth at bedtime.   metoprolol 50 MG tablet Commonly known as:  LOPRESSOR Take 1 tablet (50 mg total) by mouth 2 (two) times daily.   multivitamin with minerals Tabs tablet Take 1 tablet by mouth daily.   naphazoline-glycerin 0.012-0.2 % Soln Commonly known as:  CLEAR EYES Place 1 drop into both eyes 4 (four) times daily as needed for irritation.   ondansetron 4 MG tablet Commonly known as:  ZOFRAN Take 4 mg by mouth every 8 (eight) hours as needed for nausea or vomiting.   pantoprazole 40 MG tablet Commonly known as:  PROTONIX Take 1 tablet (40 mg total) by mouth daily.   thiamine 100 MG tablet Take 1 tablet (100 mg total) by mouth daily.   valACYclovir 500 MG tablet Commonly known as:  VALTREX Take 1,000 mg by mouth daily.   zaleplon 10 MG capsule Commonly known as:  SONATA Take 10 mg by mouth at bedtime as needed for sleep.      Follow-up Information    Verlon Au, MD .   Specialty:  [facility] Contact information: [address] [place] [address] [phone]          Allergies  Allergen Reactions  . Naltrexone Other (See Comments)    Makes her hurt everywhere. Anything higher than 25mg  gives her the issues.. Can take in smaller doses  . Baclofen Rash    Consultations:   Procedures/Studies: Ct Head Wo Contrast  Result Date: 08/29/2015 CLINICAL DATA:  The patient was brought to the [facility] ran secondary to severe intoxication. Rib she fell well attempting to go to the restroom and struck the site of her face on a counter. There is no loss of consciousness. EXAM: CT HEAD WITHOUT CONTRAST CT CERVICAL SPINE WITHOUT CONTRAST  TECHNIQUE: Multidetector CT imaging of the head and cervical spine was performed following the standard protocol without intravenous contrast. Multiplanar CT image reconstructions of the cervical spine were also generated. COMPARISON:  CT head without contrast 05/29/2015. FINDINGS: CT HEAD FINDINGS Minimal soft tissue swelling is present in the high posterior left parietal scalp without an underlying fracture. Calvarium is intact. The paranasal sinuses and mastoid air cells are clear. No acute infarct, hemorrhage, or mass lesion is present. No significant white matter changes are seen. The ventricles are of normal size. There is no significant extra-axial fluid collection. CT CERVICAL SPINE FINDINGS The cervical spine is imaged from the skullbase through T1-2. Vertebral body heights and alignment are maintained. There is some straightening of the normal cervical lordosis. No acute fracture or traumatic subluxation is present. The lung apices are clear. The soft tissues of the neck are unremarkable. IMPRESSION: 1. Essentially negative CT of the head. Minimal soft tissue swelling may be present. No acute intracranial abnormality. 2. Negative CT of the cervical spine. Electronically Signed   By: Marin Roberts M.D.   On: 08/29/2015 18:57   Ct Cervical Spine Wo Contrast  Result Date: 08/29/2015 CLINICAL DATA:  The patient was brought to the [facility] ran secondary to severe intoxication. Rib she fell well attempting to go to the restroom and struck the site of her face on a counter. There is no loss of consciousness. EXAM: CT HEAD WITHOUT CONTRAST CT CERVICAL SPINE WITHOUT CONTRAST TECHNIQUE: Multidetector CT imaging of the head and cervical spine was performed following the standard protocol without intravenous contrast. Multiplanar CT image reconstructions of the cervical spine were also generated. COMPARISON:  CT head without contrast 05/29/2015. FINDINGS: CT HEAD FINDINGS Minimal soft tissue swelling is present  in the high posterior left parietal scalp without an underlying fracture. Calvarium is intact. The paranasal sinuses and mastoid air cells are clear. No acute infarct, hemorrhage, or mass lesion is present. No significant white matter changes are seen. The ventricles are of normal size. There is no significant extra-axial fluid collection. CT CERVICAL SPINE FINDINGS The cervical spine is imaged from the skullbase through T1-2. Vertebral body heights and alignment are maintained. There is some straightening of the normal cervical lordosis. No acute fracture or traumatic subluxation is present. The lung apices are clear. The soft tissues of the neck are unremarkable. IMPRESSION: 1. Essentially negative CT of the head. Minimal soft tissue swelling may be present. No acute intracranial abnormality. 2. Negative CT of the cervical spine. Electronically Signed   By: Marin Roberts M.D.   On: 08/29/2015 18:57    (Echo, Carotid, EGD, Colonoscopy, ERCP)    Subjective:   Discharge Exam: Vitals:   08/31/15 2127 09/01/15 0445  BP: 130/72 132/75  Pulse: 78 76  Resp: 19 19  Temp: 98.4 F (36.9 C) 98.2 F (36.8 C)   Vitals:   08/31/15 1301 08/31/15 1724 08/31/15 2127 09/01/15 0445  BP: 127/82 130/86 130/72 132/75  Pulse: 80 76 78 76  Resp: Temp: 99.1 F (37.3 C) 98.6 F (37 C) 98.4 F (36.9 C) 98.2 F (36.8 C)  TempSrc: Oral Oral Oral Oral  SpO2: 98% 99% 99% 98%  Weight:      Height:        General: Pt is alert, awake, not in acute distress Cardiovascular: RRR, S1/S2 +, no rubs, no gallops Respiratory: CTA bilaterally, no wheezing, no rhonchi Abdominal: Soft, NT, ND, bowel sounds + Extremities: no edema, no cyanosis    The results of significant diagnostics from this hospitalization (including imaging, microbiology, ancillary and laboratory) are listed below for reference.     Microbiology: No results found for this or any previous visit (from the past 240 hour(s)).    Labs: BNP (last 3 results) No results for input(s): BNP in the last 8760 hours. Basic Metabolic Panel:  Recent Labs Lab 08/30/15 2045 08/31/15 0002 08/31/15 0533  NA 139 138 137  K 5.8* 4.0 4.2  CL 102 107 109  CO2  --  22 19*  GLUCOSE 81 118* 95  BUN CREATININE 1.10* 0.50 0.42*  CALCIUM  --  7.5* 7.5*  MG  --  1.8  --    Liver Function Tests: No results for input(s): AST, ALT, ALKPHOS, BILITOT, PROT, ALBUMIN in the last 168 hours. No results for input(s): LIPASE, AMYLASE in the last 168 hours. No results for input(s): AMMONIA in the last 168 hours. CBC:  Recent Labs Lab 08/30/15 2045 08/31/15 0533  WBC  --  3.5*  HGB 16.0* 11.4*  HCT 47.0* 33.4*  MCV  --  91.0  PLT  --  303   Cardiac Enzymes: No results for input(s): CKTOTAL, CKMB, CKMBINDEX, TROPONINI in the last 168 hours. BNP: Invalid input(s): POCBNP CBG: No results for input(s): GLUCAP in the last 168 hours. D-Dimer No results for input(s): DDIMER in the last 72 hours. Hgb A1c No results for input(s): HGBA1C in the last 72 hours. Lipid Profile No results for input(s): CHOL, HDL, LDLCALC, TRIG, CHOLHDL, LDLDIRECT in the last 72 hours. Thyroid function studies No results for input(s): TSH, T4TOTAL, T3FREE, THYROIDAB in the last 72 hours.  Invalid input(s): FREET3 Anemia work up No results for input(s): VITAMINB12, FOLATE, FERRITIN, TIBC, IRON, RETICCTPCT in the last 72 hours. Urinalysis    Component Value Date/Time   COLORURINE YELLOW 08/13/2015 1303   APPEARANCEUR CLOUDY (A) 08/13/2015 1303   LABSPEC 1.021 08/13/2015 1303   PHURINE 5.0 08/13/2015 1303   GLUCOSEU NEGATIVE 08/13/2015 1303   HGBUR MODERATE (A) 08/13/2015 1303   BILIRUBINUR NEGATIVE 08/13/2015 1303   KETONESUR >80 (A) 08/13/2015 1303   PROTEINUR >300 (A) 08/13/2015 1303   UROBILINOGEN 0.2 12/31/2013 2136   NITRITE NEGATIVE 08/13/2015 1303   LEUKOCYTESUR NEGATIVE 08/13/2015 1303   Sepsis Labs Invalid input(s):  PROCALCITONIN,  WBC,  LACTICIDVEN Microbiology No results found for this or any previous visit (from the past 240 hour(s)).   Time coordinating discharge: Over 30 minutes  SIGNED:   Marinda Elk, MD  [facility] 09/01/2015, 9:15 AM Pager   If 7PM-7AM, please contact night-coverage [email or web address] Password [email or web address]

## 2015-09-05 ENCOUNTER — Encounter (HOSPITAL_COMMUNITY): Payer: Self-pay | Admitting: Emergency Medicine

## 2015-09-05 ENCOUNTER — Emergency Department (HOSPITAL_COMMUNITY)
Admission: EM | Admit: 2015-09-05 | Discharge: 2015-09-06 | Disposition: A | Discharge status: Home / Self Care | Payer: BLUE CROSS/BLUE SHIELD | Attending: Emergency Medicine | Admitting: Emergency Medicine

## 2015-09-05 DIAGNOSIS — F329 Major depressive disorder, single episode, unspecified: Secondary | ICD-10-CM | POA: Diagnosis not present

## 2015-09-05 DIAGNOSIS — F10120 Alcohol abuse with intoxication, uncomplicated: Secondary | ICD-10-CM | POA: Insufficient documentation

## 2015-09-05 DIAGNOSIS — F10920 Alcohol use, unspecified with intoxication, uncomplicated: Secondary | ICD-10-CM

## 2015-09-05 DIAGNOSIS — Z79899 Other long term (current) drug therapy: Secondary | ICD-10-CM | POA: Insufficient documentation

## 2015-09-05 DIAGNOSIS — F1010 Alcohol abuse, uncomplicated: Secondary | ICD-10-CM

## 2015-09-05 LAB — CBC WITH DIFFERENTIAL/PLATELET
BASOS PCT: 1 %
Basophils Absolute: 0 10*3/uL (ref 0.0–0.1)
EOS ABS: 0.1 10*3/uL (ref 0.0–0.7)
EOS PCT: 1 %
HCT: 38.5 % (ref 36.0–46.0)
Hemoglobin: 13.2 g/dL (ref 12.0–15.0)
Lymphocytes Relative: 60 %
Lymphs Abs: 3.3 10*3/uL (ref 0.7–4.0)
MCH: 31.8 pg (ref 26.0–34.0)
MCHC: 34.3 g/dL (ref 30.0–36.0)
MCV: 92.8 fL (ref 78.0–100.0)
MONO ABS: 0.4 10*3/uL (ref 0.1–1.0)
Monocytes Relative: 7 %
Neutro Abs: 1.7 10*3/uL (ref 1.7–7.7)
Neutrophils Relative %: 31 %
PLATELETS: 201 10*3/uL (ref 150–400)
RBC: 4.15 MIL/uL (ref 3.87–5.11)
RDW: 15.2 % (ref 11.5–15.5)
WBC: 5.5 10*3/uL (ref 4.0–10.5)

## 2015-09-05 LAB — I-STAT BETA HCG BLOOD, ED (MC, WL, AP ONLY)

## 2015-09-05 MED ORDER — THIAMINE HCL 100 MG/ML IJ SOLN
Freq: Once | INTRAVENOUS | Status: AC
  Administered 2015-09-06: via INTRAVENOUS
  Filled 2015-09-05: qty 1000

## 2015-09-05 MED ORDER — ONDANSETRON HCL 4 MG/2ML IJ SOLN
4.0000 mg | Freq: Once | INTRAMUSCULAR | Status: AC
  Administered 2015-09-06: 4 mg via INTRAVENOUS
  Filled 2015-09-05: qty 2

## 2015-09-05 MED ORDER — SODIUM CHLORIDE 0.9 % IV BOLUS (SEPSIS)
1000.0000 mL | Freq: Once | INTRAVENOUS | Status: AC
  Administered 2015-09-05: 1000 mL via INTRAVENOUS

## 2015-09-05 NOTE — ED Provider Notes (Signed)
[facility]-EMERGENCY DEPT Provider Note   CSN: [id number] Arrival date & time: 09/05/15  2134  By signing my name below, I, [name], attest that this documentation has been prepared under the direction and in the presence of Elpidio Anis, PA-C. Electronically Signed: [name], ED Scribe. 09/05/15. 11:26 PM.  First MD Initiated Contact with Patient 09/05/15 2312    History   Chief Complaint Chief Complaint  Patient presents with  . Alcohol Intoxication  . Depression   The history is provided by the patient and the EMS personnel. No language interpreter was used.    HPI Comments: [name] Stewart is a 35 y.o. female with PMHx of Depression, Bipolar Disorder, Alcoholism who presents to the Emergency Department complaining of nausea. Pt states she is detoxing hard. Pt states the last time she drank was before she came in. Pt states she normally drinks "a lot" per day. Pt states she has had a decreased amount of alcohol over the past few days. Pt has been trying to take Librium which makes her feel nauseous, sick, and drowsy. Pt states she has done inpatient detox before, but this detox is outpatient. Pt states to EMS that she was depressed. Pt denies any suicidal ideations or homicidal ideations. Pt denies hallucinations. Pt denies illicit drug use. Pt states she is otherwise healthy.   Past Medical History:  Diagnosis Date  . Alcohol related seizure (HCC)   . Alcoholic (HCC)   . Anxiety   . Bipolar disorder (HCC)   . Depression   . Post traumatic stress disorder (PTSD)   . Tension headache, chronic    "monthly" (01/31/2015)    Patient Active Problem List   Diagnosis Date Noted  . Withdrawal seizures (HCC) 08/31/2015  . Tachycardia 08/31/2015  . Alcohol intoxication (HCC) 08/13/2015  . Depression 05/29/2015  . Generalized anxiety disorder   . Transaminitis 02/17/2015  . Major depressive disorder, recurrent episode, severe (HCC) 02/05/2015  . PTSD (post-traumatic stress disorder)  12/14/2014  . Bipolar disorder with severe depression (HCC) 12/14/2014  . Alcohol use disorder, severe, dependence (HCC) 12/13/2014  . Metabolic acidosis 01/01/2014  . Alcohol withdrawal (HCC) 01/01/2014  . GERD (gastroesophageal reflux disease) 01/01/2014  . Coarse tremors 01/01/2014    Past Surgical History:  Procedure Laterality Date  . EXCISIONAL HEMORRHOIDECTOMY  20111  . OSTEOCHONDROMA EXCISION Right ~ 1996   "knee"  . REFRACTIVE SURGERY Bilateral ~ 2011    OB History    No data available      Home Medications    Prior to Admission medications   Medication Sig Start Date End Date Taking? Authorizing Provider  chlordiazePOXIDE (LIBRIUM) 5 MG capsule Take 10 mg by mouth 3 (three) times daily as needed for anxiety or withdrawal.  08/24/15  Yes Historical Provider, MD  gabapentin (NEURONTIN) 300 MG capsule Take 300 mg by mouth daily.  08/23/15  Yes Historical Provider, MD  lamoTRIgine (LAMICTAL) 150 MG tablet Take 150 mg by mouth daily.   Yes Historical Provider, MD  Levonorgestrel-Ethinyl Estradiol (CAMRESE) 0.15-0.03 &0.01 MG tablet Take 1 tablet by mouth daily.   Yes Historical Provider, MD  lurasidone (LATUDA) 80 MG TABS tablet Take 80 mg by mouth daily with breakfast.   Yes Historical Provider, MD  Melatonin 3 MG TABS Take 9 mg by mouth at bedtime.   Yes Historical Provider, MD  metoprolol tartrate (LOPRESSOR) 50 MG tablet Take 1 tablet (50 mg total) by mouth 2 (two) times daily. 08/14/15  Yes Leroy Sea, MD  Multiple Vitamin (MULTIVITAMIN WITH MINERALS) TABS tablet Take 1 tablet by mouth daily. 02/19/15  Yes Carly Arlyce HarmanJ Rivet, MD  naphazoline-glycerin (CLEAR EYES) 0.012-0.2 % SOLN Place 1 drop into both eyes 4 (four) times daily as needed for irritation.   Yes Historical Provider, MD  ondansetron (ZOFRAN) 4 MG tablet Take 4 mg by mouth every 8 (eight) hours as needed for nausea or vomiting.   Yes Historical Provider, MD  pantoprazole (PROTONIX) 40 MG tablet Take 1 tablet (40  mg total) by mouth daily. 08/14/15  Yes Leroy SeaPrashant K Singh, MD  thiamine 100 MG tablet Take 1 tablet (100 mg total) by mouth daily. 02/19/15  Yes Carly Arlyce HarmanJ Rivet, MD  valACYclovir (VALTREX) 500 MG tablet Take 1,000 mg by mouth daily. 07/12/15  Yes Historical Provider, MD  zaleplon (SONATA) 10 MG capsule Take 10 mg by mouth at bedtime as needed for sleep.  08/23/15  Yes Historical Provider, MD  folic acid (FOLVITE) 1 MG tablet Take 1 tablet (1 mg total) by mouth daily. Patient not taking: Reported on 08/13/2015 02/19/15   Su Hoffarly J Rivet, MD    Family History Family History  Problem Relation Age of Onset  . Anxiety disorder Mother   . Anxiety disorder Sister   . Anxiety disorder Maternal Grandmother   . Anxiety disorder Father   . Alcohol abuse Father   . Alcohol abuse Brother   . Alcohol abuse Paternal Uncle     Social History Social History  Substance Use Topics  . Smoking status: Never Smoker  . Smokeless tobacco: Never Used  . Alcohol use Yes     Comment: Wine and Vodka. Last drink: Today     Allergies   Naltrexone and Baclofen   Review of Systems Review of Systems  Constitutional: Negative for fever.  Gastrointestinal: Positive for nausea.  Psychiatric/Behavioral: Positive for dysphoric mood. Negative for hallucinations and suicidal ideas.       - Homicidal Ideations  All other systems reviewed and are negative.  Physical Exam Updated Vital Signs BP 106/75 (BP Location: Left Arm)   Pulse 95   Temp 98.6 F (37 C) (Oral)   Resp 18   LMP 09/03/2015 (Exact Date)   SpO2 98%   Physical Exam  Constitutional: She appears well-developed and well-nourished.  HENT:  Head: Normocephalic.  Mouth/Throat: Mucous membranes are dry.  Eyes: Conjunctivae are normal.  Cardiovascular: Regular rhythm.  Tachycardia present.   Pulmonary/Chest: Effort normal. No respiratory distress.  Abdominal: Soft. She exhibits no distension. There is no tenderness.  Musculoskeletal: Normal range of  motion.  Neurological: She is alert. She displays tremor.  Acutely intoxicated  Skin: Skin is warm and dry.  Psychiatric: She has a normal mood and affect. Her behavior is normal.  Nursing note and vitals reviewed.  ED Treatments / Results  DIAGNOSTIC STUDIES: Oxygen Saturation is 98% on RA, normal by my interpretation.    COORDINATION OF CARE: 11:15 PM Discussed treatment plan with pt at bedside which includes lab work and pt agreed to plan.  Labs (all labs ordered are listed, but only abnormal results are displayed) Labs Reviewed  COMPREHENSIVE METABOLIC PANEL - Abnormal; Notable for the following:       Result Value   Total Protein 8.3 (*)    AST 102 (*)    ALT 197 (*)    All other components within normal limits  CBC WITH DIFFERENTIAL/PLATELET  ETHANOL  SALICYLATE LEVEL  ACETAMINOPHEN LEVEL  I-STAT BETA HCG BLOOD, ED ([facility], [facility], [facility]  ONLY)   Results for orders placed or performed during the hospital encounter of 09/05/15  Ethanol  Result Value Ref Range   Alcohol, Ethyl (B) 375 (HH) <5 mg/dL  Comprehensive metabolic panel  Result Value Ref Range   Sodium 143 135 - 145 mmol/L   Potassium 4.3 3.5 - 5.1 mmol/L   Chloride 108 101 - 111 mmol/L   CO2 24 22 - 32 mmol/L   Glucose, Bld 96 65 - 99 mg/dL   BUN 10 6 - 20 mg/dL   Creatinine, Ser 4.09 0.44 - 1.00 mg/dL   Calcium 8.9 8.9 - 81.1 mg/dL   Total Protein 8.3 (H) 6.5 - 8.1 g/dL   Albumin 4.3 3.5 - 5.0 g/dL   AST 914 (H) 15 - 41 U/L   ALT 197 (H) 14 - 54 U/L   Alkaline Phosphatase 39 38 - 126 U/L   Total Bilirubin 0.5 0.3 - 1.2 mg/dL   GFR calc non Af Amer >60 >60 mL/min   GFR calc Af Amer >60 >60 mL/min   Anion gap 11 5 - 15  CBC with Differential  Result Value Ref Range   WBC 5.5 4.0 - 10.5 K/uL   RBC 4.15 3.87 - 5.11 MIL/uL   Hemoglobin 13.2 12.0 - 15.0 g/dL   HCT 78.2 95.6 - 21.3 %   MCV 92.8 78.0 - 100.0 fL   MCH 31.8 26.0 - 34.0 pg   MCHC 34.3 30.0 - 36.0 g/dL   RDW 08.6 57.8 - 46.9 %   Platelets 201 150  - 400 K/uL   Neutrophils Relative % 31 %   Neutro Abs 1.7 1.7 - 7.7 K/uL   Lymphocytes Relative 60 %   Lymphs Abs 3.3 0.7 - 4.0 K/uL   Monocytes Relative 7 %   Monocytes Absolute 0.4 0.1 - 1.0 K/uL   Eosinophils Relative 1 %   Eosinophils Absolute 0.1 0.0 - 0.7 K/uL   Basophils Relative 1 %   Basophils Absolute 0.0 0.0 - 0.1 K/uL  Salicylate level  Result Value Ref Range   Salicylate Lvl <4.0 2.8 - 30.0 mg/dL  Acetaminophen level  Result Value Ref Range   Acetaminophen (Tylenol), Serum <10 (L) 10 - 30 ug/mL  I-Stat beta hCG blood, ED  Result Value Ref Range   I-stat hCG, quantitative <5.0 <5 mIU/mL   Comment 3            EKG  EKG Interpretation None       Radiology No results found.  Procedures Procedures (including critical care time)  Medications Ordered in ED Medications  sodium chloride 0.9 % bolus 1,000 mL (1,000 mLs Intravenous New Bag/Given 09/05/15 2354)  sodium chloride 0.9 % 1,000 mL with thiamine 100 mg, folic acid 1 mg, multivitamins adult 10 mL infusion ( Intravenous New Bag/Given 09/06/15 0001)  ondansetron (ZOFRAN) injection 4 mg (4 mg Intravenous Given 09/06/15 0001)     Initial Impression / Assessment and Plan / ED Course  I have reviewed the triage vital signs and the nursing notes.  Pertinent labs & imaging results that were available during my care of the patient were reviewed by me and considered in my medical decision making (see chart for details).  Clinical Course    Patient presents significantly intoxicated with concerns for withdrawal. She feels shaky and has had non-bloody vomiting.   Vomiting controlled in the ED. She is redydrated. ETOH 375. No evidence withdrawal.   Reassessment. The patient is awake, coherent, oriented. She has a  friend who will drive her home and she prefers discharge home. She continues to deny SI/HI. Will discharge with outpatient resources.  Final Clinical Impressions(s) / ED Diagnoses   Final diagnoses:    None  1. Alcohol intoxication 2. Alcohol dependence  New Prescriptions New Prescriptions   No medications on file   I personally performed the services described in this documentation, which was scribed in my presence. The recorded information has been reviewed and is accurate.      Elpidio Anis, PA-C 09/06/15 0302    Pricilla Loveless, MD 09/08/15 [phone]

## 2015-09-05 NOTE — ED Triage Notes (Signed)
[facility] presents with a 35 yo female from home with alcohol related symptoms of nausea and shakiness.  Pt stated she attempted to self-detox when she woke up this morning shaky and nauseous.  Pt stated she drank two bottles of wines to alleviate the symptoms.  [facility] she is depressed, but denies SI and HI, because she is going to jail for a DUI.  Hx of bipolar disorder and HTN

## 2015-09-06 LAB — COMPREHENSIVE METABOLIC PANEL
ALBUMIN: 4.3 g/dL (ref 3.5–5.0)
ALK PHOS: 39 U/L (ref 38–126)
ALT: 197 U/L — ABNORMAL HIGH (ref 14–54)
ANION GAP: 11 (ref 5–15)
AST: 102 U/L — ABNORMAL HIGH (ref 15–41)
BUN: 10 mg/dL (ref 6–20)
CALCIUM: 8.9 mg/dL (ref 8.9–10.3)
CHLORIDE: 108 mmol/L (ref 101–111)
CO2: 24 mmol/L (ref 22–32)
Creatinine, Ser: 0.5 mg/dL (ref 0.44–1.00)
GFR calc non Af Amer: >60 mL/min (ref >60)
GLUCOSE: 96 mg/dL (ref 65–99)
Potassium: 4.3 mmol/L (ref 3.5–5.1)
SODIUM: 143 mmol/L (ref 135–145)
Total Bilirubin: 0.5 mg/dL (ref 0.3–1.2)
Total Protein: 8.3 g/dL — ABNORMAL HIGH (ref 6.5–8.1)

## 2015-09-06 LAB — SALICYLATE LEVEL: Salicylate Lvl: <4 mg/dL (ref 2.8–30.0)

## 2015-09-06 LAB — ETHANOL: ALCOHOL ETHYL (B): 375 mg/dL — AB (ref <5)

## 2015-09-06 LAB — ACETAMINOPHEN LEVEL

## 2015-09-06 NOTE — ED Notes (Signed)
Pt verbalized wishes to leave "as soon as possible".  Visitor made PA aware.

## 2015-09-06 NOTE — ED Notes (Signed)
Pt ambulatory and independent at discharge.  Verbalized understanding of discharge instructions and the importance of not driving while impaired.

## 2016-01-11 ENCOUNTER — Emergency Department (HOSPITAL_COMMUNITY): Payer: 59

## 2016-01-11 ENCOUNTER — Emergency Department (HOSPITAL_COMMUNITY)
Admission: EM | Admit: 2016-01-11 | Discharge: 2016-01-12 | Disposition: A | Discharge status: Home / Self Care | Payer: 59 | Attending: Emergency Medicine | Admitting: Emergency Medicine

## 2016-01-11 ENCOUNTER — Encounter (HOSPITAL_COMMUNITY): Payer: Self-pay | Admitting: Emergency Medicine

## 2016-01-11 DIAGNOSIS — Z7984 Long term (current) use of oral hypoglycemic drugs: Secondary | ICD-10-CM | POA: Insufficient documentation

## 2016-01-11 DIAGNOSIS — F10129 Alcohol abuse with intoxication, unspecified: Secondary | ICD-10-CM | POA: Insufficient documentation

## 2016-01-11 DIAGNOSIS — R51 Headache: Secondary | ICD-10-CM | POA: Diagnosis not present

## 2016-01-11 DIAGNOSIS — F10920 Alcohol use, unspecified with intoxication, uncomplicated: Secondary | ICD-10-CM

## 2016-01-11 LAB — I-STAT BETA HCG BLOOD, ED (MC, WL, AP ONLY)

## 2016-01-11 MED ORDER — ONDANSETRON HCL 4 MG/2ML IJ SOLN
4.0000 mg | Freq: Once | INTRAMUSCULAR | Status: AC
  Administered 2016-01-11: 4 mg via INTRAVENOUS
  Filled 2016-01-11: qty 2

## 2016-01-11 MED ORDER — FAMOTIDINE IN NACL 20-0.9 MG/50ML-% IV SOLN
20.0000 mg | INTRAVENOUS | Status: AC
  Administered 2016-01-11: 20 mg via INTRAVENOUS
  Filled 2016-01-11: qty 50

## 2016-01-11 MED ORDER — SODIUM CHLORIDE 0.9 % IV BOLUS (SEPSIS)
1000.0000 mL | INTRAVENOUS | Status: AC
  Administered 2016-01-11: 1000 mL via INTRAVENOUS

## 2016-01-11 NOTE — ED Provider Notes (Signed)
[facility]-EMERGENCY DEPT Provider Note   CSN: [id number] Arrival date & time: 01/11/16  2143 By signing my name below, I, [name] Stewart, attest that this documentation has been prepared under the direction and in the presence of TRW AutomotiveKelly Kyndall Chaplin, PA-C. Electronically Signed: Linus GalasMaharshi Stewart, ED Scribe. 01/12/16. 10:10 PM.   History   Chief Complaint Chief Complaint  Patient presents with  . Alcohol Intoxication   The history is provided by the patient. No language interpreter was used.   HPI Comments: [name] Stewart is a 35 y.o. female who presents to the Emergency Department via EMS with a PMHx of alcohol abuse complaining of alcohol intoxication for the past 2 weeks. Prior to the past 2 weeks, she was sober for 4 months. Pt states she could not keep drinking liquor at home as it became too much for her to bare so she came to the ED for an evaluation. She states because of her drinking, she has been frequently falling hitting her head. Her frequent falls has caused her to have pain "all over." Pt last drink was prior to arrival. Pt denies ETOH withdrawal seizures, bladder or bowel incontinence, numbness, or any other symptoms at this time.  Pt denies substance abuse.    Past Medical History:  Diagnosis Date  . Alcohol related seizure (HCC)   . Alcoholic (HCC)   . Anxiety   . Bipolar disorder (HCC)   . Depression   . Post traumatic stress disorder (PTSD)   . Tension headache, chronic    "monthly" (01/31/2015)    Patient Active Problem List   Diagnosis Date Noted  . Withdrawal seizures (HCC) 08/31/2015  . Tachycardia 08/31/2015  . Alcohol intoxication (HCC) 08/13/2015  . Depression 05/29/2015  . Generalized anxiety disorder   . Transaminitis 02/17/2015  . Major depressive disorder, recurrent episode, severe (HCC) 02/05/2015  . PTSD (post-traumatic stress disorder) 12/14/2014  . Bipolar disorder with severe depression (HCC) 12/14/2014  . Alcohol use disorder, severe, dependence (HCC)  12/13/2014  . Metabolic acidosis 01/01/2014  . Alcohol withdrawal (HCC) 01/01/2014  . GERD (gastroesophageal reflux disease) 01/01/2014  . Coarse tremors 01/01/2014    Past Surgical History:  Procedure Laterality Date  . EXCISIONAL HEMORRHOIDECTOMY  20111  . OSTEOCHONDROMA EXCISION Right ~ 1996   "knee"  . REFRACTIVE SURGERY Bilateral ~ 2011    OB History    No data available       Home Medications    Prior to Admission medications   Medication Sig Start Date End Date Taking? Authorizing Provider  gabapentin (NEURONTIN) 300 MG capsule Take 300-600 mg by mouth 2 (two) times daily. 300 mg qam and 600 mg qhs 08/23/15  Yes Historical Provider, MD  lamoTRIgine (LAMICTAL) 200 MG tablet Take 200 mg by mouth every evening. 10/22/15  Yes Historical Provider, MD  Levonorgestrel-Ethinyl Estradiol (CAMRESE) 0.15-0.03 &0.01 MG tablet Take 1 tablet by mouth daily.   Yes Historical Provider, MD  lurasidone (LATUDA) 80 MG TABS tablet Take 40 mg by mouth daily with breakfast.    Yes Historical Provider, MD  Multiple Vitamin (MULTIVITAMIN WITH MINERALS) TABS tablet Take 1 tablet by mouth daily. 02/19/15  Yes Carly Arlyce HarmanJ Rivet, MD  naphazoline-glycerin (CLEAR EYES) 0.012-0.2 % SOLN Place 1 drop into both eyes 4 (four) times daily as needed for irritation.   Yes Historical Provider, MD  ondansetron (ZOFRAN) 4 MG tablet Take 4 mg by mouth every 8 (eight) hours as needed for nausea or vomiting.   Yes Historical Provider, MD  topiramate (TOPAMAX)  50 MG tablet Take 50 mg by mouth 2 (two) times daily. 10/22/15  Yes Historical Provider, MD  valACYclovir (VALTREX) 500 MG tablet Take 1,000 mg by mouth daily as needed (outbreaks).  07/12/15  Yes Historical Provider, MD  folic acid (FOLVITE) 1 MG tablet Take 1 tablet (1 mg total) by mouth daily. Patient not taking: Reported on 01/11/2016 02/19/15   Su Hoffarly J Rivet, MD  metoprolol tartrate (LOPRESSOR) 50 MG tablet Take 1 tablet (50 mg total) by mouth 2 (two) times  daily. Patient not taking: Reported on 01/11/2016 08/14/15   Leroy SeaPrashant K Singh, MD  pantoprazole (PROTONIX) 40 MG tablet Take 1 tablet (40 mg total) by mouth daily. Patient not taking: Reported on 01/11/2016 08/14/15   Leroy SeaPrashant K Singh, MD  thiamine 100 MG tablet Take 1 tablet (100 mg total) by mouth daily. Patient not taking: Reported on 01/11/2016 02/19/15   Su Hoffarly J Rivet, MD    Family History Family History  Problem Relation Age of Onset  . Anxiety disorder Mother   . Anxiety disorder Sister   . Anxiety disorder Maternal Grandmother   . Anxiety disorder Father   . Alcohol abuse Father   . Alcohol abuse Brother   . Alcohol abuse Paternal Uncle     Social History Social History  Substance Use Topics  . Smoking status: Never Smoker  . Smokeless tobacco: Never Used  . Alcohol use Yes     Comment: Wine and Vodka. Last drink: Today     Allergies   Naltrexone and Baclofen   Review of Systems Review of Systems A complete 10 system review of systems was obtained and all systems are negative except as noted in the HPI and PMH.    Physical Exam Updated Vital Signs BP 101/76   Pulse 98   Temp 98.2 F (36.8 C) (Oral)   Resp 15   Ht 5\' 4"  (1.626 m)   LMP 09/11/2015   SpO2 94%   Physical Exam  Constitutional: She is oriented to person, place, and time. She appears well-developed and well-nourished. No distress.  Nontoxic and in NAD  HENT:  Head: Normocephalic and atraumatic.  Mouth/Throat: Oropharynx is clear and moist.  No battle's sign or raccoon's eyes.  Eyes: Conjunctivae and EOM are normal. Pupils are equal, round, and reactive to light. No scleral icterus.  Neck: Normal range of motion.  Cardiovascular: Regular rhythm and intact distal pulses.   Tachycardia  Pulmonary/Chest: Effort normal. No respiratory distress. She has no wheezes. She has no rales.  Lungs CTAB  Musculoskeletal: Normal range of motion.  Neurological: She is alert and oriented to person, place,  and time. She exhibits normal muscle tone. Coordination normal.  GCS 15. Speech is goal oriented. Patient moving all extremities.  Skin: Skin is warm and dry. Capillary refill takes less than 2 seconds. No rash noted. She is not diaphoretic. No erythema. No pallor.  Psychiatric: Her mood appears anxious. Her speech is slurred. She is agitated. She exhibits a depressed mood. She expresses no homicidal and no suicidal ideation.  Patient tearful, anxious  Nursing note and vitals reviewed.    ED Treatments / Results  DIAGNOSTIC STUDIES: Oxygen Saturation is 94% on room air, normal by my interpretation.    COORDINATION OF CARE: 10:10 PM Discussed treatment plan with pt at bedside and pt agreed to plan.  Labs (all labs ordered are listed, but only abnormal results are displayed) Labs Reviewed  CBC - Abnormal; Notable for the following:  Result Value   Hemoglobin 15.6 (*)    MCHC 36.4 (*)    All other components within normal limits  COMPREHENSIVE METABOLIC PANEL - Abnormal; Notable for the following:    Calcium 8.5 (*)    AST 266 (*)    ALT 216 (*)    All other components within normal limits  I-STAT BETA HCG BLOOD, ED ([facility], [facility], [facility] ONLY)  I-STAT CHEM 8, ED    EKG  EKG Interpretation None       Radiology Ct Head Wo Contrast  Result Date: 01/11/2016 CLINICAL DATA:  35 year old female with alcohol intoxication. EXAM: CT HEAD WITHOUT CONTRAST TECHNIQUE: Contiguous axial images were obtained from the base of the skull through the vertex without intravenous contrast. COMPARISON:  Head CT dated 08/29/2015 FINDINGS: Brain: No evidence of acute infarction, hemorrhage, hydrocephalus, extra-axial collection or mass lesion/mass effect. Vascular: No hyperdense vessel or unexpected calcification. Skull: Normal. Negative for fracture or focal lesion. Sinuses/Orbits: No acute finding. Other: None IMPRESSION: No acute intracranial pathology. Electronically Signed   By: Elgie Collard M.D.    On: 01/11/2016 23:09    Procedures Procedures (including critical care time)  Medications Ordered in ED Medications  sodium chloride 0.9 % bolus 1,000 mL (0 mLs Intravenous Stopped 01/12/16 0105)  famotidine (PEPCID) IVPB 20 mg premix (0 mg Intravenous Stopped 01/12/16 0010)  ondansetron (ZOFRAN) injection 4 mg (4 mg Intravenous Given 01/11/16 2330)     Initial Impression / Assessment and Plan / ED Course  I have reviewed the triage vital signs and the nursing notes.  Pertinent labs & imaging results that were available during my care of the patient were reviewed by me and considered in my medical decision making (see chart for details).  Clinical Course     5:53 AM Patient tachycardic. C/o feeling "hot". Additional IVF and ativan ordered for tachycardia. No HTN. Suspect this may be partially due to acute ETOH withdrawal. CIWA initiated. Plan to provide Librium taper for outpatient detox. Patient has been ambulatory in the department without difficulty. Patient signed out to oncoming provider who will monitor vitals; anticipate discharge when tachycardia resolves.   Final Clinical Impressions(s) / ED Diagnoses   Final diagnoses:  Alcoholic intoxication without complication (HCC)    New Prescriptions New Prescriptions   CHLORDIAZEPOXIDE (LIBRIUM) 25 MG CAPSULE    50mg  PO TID x 1D, then 25-50mg  PO BID X 1D, then 25-50mg  PO QD X 1D    I personally performed the services described in this documentation, which was scribed in my presence. The recorded information has been reviewed and is accurate.       Antony Madura, PA-C 01/12/16 1610    Mancel Bale, MD 01/12/16 [phone]

## 2016-01-11 NOTE — ED Triage Notes (Signed)
Per EMS Pt from home.  Pt was clean for 2 months and confirms relapsed 2 wks ago when boyfriend broke up with her. Unknown amount of ETOH consumption. EMS reports a fifth and a half was gone at scene.

## 2016-01-11 NOTE — ED Notes (Signed)
Bed: [id number] Expected date:  Expected time:  Means of arrival:  Comments: 45yoF etoh

## 2016-01-11 NOTE — ED Notes (Signed)
Writer notified EDP of abnormal I stat chem 8 result.  

## 2016-01-12 LAB — COMPREHENSIVE METABOLIC PANEL
ALK PHOS: 47 U/L (ref 38–126)
ALT: 216 U/L — AB (ref 14–54)
AST: 266 U/L — AB (ref 15–41)
Albumin: 4.2 g/dL (ref 3.5–5.0)
Anion gap: 14 (ref 5–15)
BUN: 13 mg/dL (ref 6–20)
CALCIUM: 8.5 mg/dL — AB (ref 8.9–10.3)
CHLORIDE: 101 mmol/L (ref 101–111)
CO2: 23 mmol/L (ref 22–32)
CREATININE: 0.49 mg/dL (ref 0.44–1.00)
GFR calc Af Amer: >60 mL/min (ref >60)
GFR calc non Af Amer: >60 mL/min (ref >60)
GLUCOSE: 89 mg/dL (ref 65–99)
Potassium: 4.1 mmol/L (ref 3.5–5.1)
Sodium: 138 mmol/L (ref 135–145)
Total Bilirubin: 0.9 mg/dL (ref 0.3–1.2)
Total Protein: 7.4 g/dL (ref 6.5–8.1)

## 2016-01-12 LAB — CBC
HCT: 42.9 % (ref 36.0–46.0)
HEMOGLOBIN: 15.6 g/dL — AB (ref 12.0–15.0)
MCH: 32 pg (ref 26.0–34.0)
MCHC: 36.4 g/dL — ABNORMAL HIGH (ref 30.0–36.0)
MCV: 87.9 fL (ref 78.0–100.0)
PLATELETS: 165 10*3/uL (ref 150–400)
RBC: 4.88 MIL/uL (ref 3.87–5.11)
RDW: 13.9 % (ref 11.5–15.5)
WBC: 5.5 10*3/uL (ref 4.0–10.5)

## 2016-01-12 MED ORDER — SODIUM CHLORIDE 0.9 % IV BOLUS (SEPSIS)
1000.0000 mL | INTRAVENOUS | Status: AC
  Administered 2016-01-12: 1000 mL via INTRAVENOUS

## 2016-01-12 MED ORDER — ONDANSETRON HCL 4 MG PO TABS: 4.0000 mg | ORAL_TABLET | Freq: Three times a day (TID) | ORAL | 0 refills | Status: AC | PRN

## 2016-01-12 MED ORDER — IBUPROFEN 800 MG PO TABS
800.0000 mg | ORAL_TABLET | Freq: Once | ORAL | Status: AC
  Administered 2016-01-12: 800 mg via ORAL
  Filled 2016-01-12: qty 1

## 2016-01-12 MED ORDER — CHLORDIAZEPOXIDE HCL 25 MG PO CAPS: ORAL_CAPSULE | ORAL | 0 refills | Status: DC

## 2016-01-12 MED ORDER — LORAZEPAM 2 MG/ML IJ SOLN
1.0000 mg | Freq: Once | INTRAMUSCULAR | Status: AC
  Administered 2016-01-12: 1 mg via INTRAVENOUS
  Filled 2016-01-12: qty 1

## 2016-01-12 MED ORDER — ONDANSETRON HCL 4 MG/2ML IJ SOLN
4.0000 mg | Freq: Once | INTRAMUSCULAR | Status: AC
  Administered 2016-01-12: 4 mg via INTRAVENOUS
  Filled 2016-01-12: qty 2

## 2016-01-12 NOTE — Discharge Instructions (Signed)
If you desire detox from alcohol, use Librium as prescribed to assist with your detox process. We advised a follow-up with a detox/rehabilitation facility for assistance with her alcohol abuse. You have been provided a list of facilities in the area who can help you with this process. Take tylenol for pain, as needed. Return to the emergency department for new or concerning symptoms.

## 2016-01-12 NOTE — ED Provider Notes (Signed)
Pt signed off to me by Antony MaduraKelly Humes, PA-C.    35 yo female with pmh of ETOH abuse, anxiety, bipolar disorder, depression, PTSD, chronic headaches complaining of EOTH intoxication for 2 weeks after being sober for 4 months.  Pt presented to ED for evaluation.  Pt reports multiple falls and hitting head.  Last drink was PTA.  CMP, CBC, hCG grossly unremarkable.  CT head negative.  Pt received IVF, topomax, zofran, pepcid, ibuprofen, ativan in ED. Pt with intoxication tachycardic in the  100-110s, suspect from withdrawal.  Plan is to monitor tachycardia and discharge.  0645: HR 92-120.  Pt asking for alcohol level testing and work to return to work on Monday.   0725: Pt asleep, HR wnl.  Did not obtain alcohol level as it would not have provided additional information or changed treatment plan/dispo. Pt agreeable to dispo plan. Work note given.     Liberty Handylaudia J Gibbons, PA-C 01/12/16 [phone]    Mancel BaleElliott Wentz, MD 01/13/16 [phone]

## 2016-02-16 ENCOUNTER — Emergency Department (HOSPITAL_COMMUNITY)
Admission: EM | Admit: 2016-02-16 | Discharge: 2016-02-17 | Disposition: A | Discharge status: Home / Self Care | Payer: 59 | Attending: Emergency Medicine | Admitting: Emergency Medicine

## 2016-02-16 ENCOUNTER — Encounter (HOSPITAL_COMMUNITY): Payer: Self-pay | Admitting: Nurse Practitioner

## 2016-02-16 DIAGNOSIS — F10120 Alcohol abuse with intoxication, uncomplicated: Secondary | ICD-10-CM | POA: Insufficient documentation

## 2016-02-16 DIAGNOSIS — F10920 Alcohol use, unspecified with intoxication, uncomplicated: Secondary | ICD-10-CM

## 2016-02-16 DIAGNOSIS — Z79899 Other long term (current) drug therapy: Secondary | ICD-10-CM | POA: Diagnosis not present

## 2016-02-16 LAB — CBC WITH DIFFERENTIAL/PLATELET
Basophils Absolute: 0.1 10*3/uL (ref 0.0–0.1)
Basophils Relative: 1 %
Eosinophils Absolute: 0 10*3/uL (ref 0.0–0.7)
Eosinophils Relative: 0 %
HCT: 43.8 % (ref 36.0–46.0)
Hemoglobin: 15.3 g/dL — ABNORMAL HIGH (ref 12.0–15.0)
LYMPHS ABS: 2.8 10*3/uL (ref 0.7–4.0)
LYMPHS PCT: 45 %
MCH: 32 pg (ref 26.0–34.0)
MCHC: 34.9 g/dL (ref 30.0–36.0)
MCV: 91.6 fL (ref 78.0–100.0)
MONO ABS: 0.4 10*3/uL (ref 0.1–1.0)
MONOS PCT: 7 %
Neutro Abs: 2.9 10*3/uL (ref 1.7–7.7)
Neutrophils Relative %: 47 %
Platelets: 248 10*3/uL (ref 150–400)
RBC: 4.78 MIL/uL (ref 3.87–5.11)
RDW: 14.3 % (ref 11.5–15.5)
WBC: 6.2 10*3/uL (ref 4.0–10.5)

## 2016-02-16 LAB — COMPREHENSIVE METABOLIC PANEL
ALBUMIN: 5.1 g/dL — AB (ref 3.5–5.0)
ALT: 91 U/L — ABNORMAL HIGH (ref 14–54)
ANION GAP: 17 — AB (ref 5–15)
AST: 81 U/L — ABNORMAL HIGH (ref 15–41)
Alkaline Phosphatase: 48 U/L (ref 38–126)
BUN: 12 mg/dL (ref 6–20)
CO2: 25 mmol/L (ref 22–32)
Calcium: 9.1 mg/dL (ref 8.9–10.3)
Chloride: 102 mmol/L (ref 101–111)
Creatinine, Ser: 0.56 mg/dL (ref 0.44–1.00)
GFR calc non Af Amer: >60 mL/min (ref >60)
GLUCOSE: 88 mg/dL (ref 65–99)
POTASSIUM: 4 mmol/L (ref 3.5–5.1)
SODIUM: 144 mmol/L (ref 135–145)
TOTAL PROTEIN: 8.1 g/dL (ref 6.5–8.1)
Total Bilirubin: 0.3 mg/dL (ref 0.3–1.2)

## 2016-02-16 LAB — RAPID URINE DRUG SCREEN, HOSP PERFORMED
Amphetamines: NOT DETECTED
Barbiturates: NOT DETECTED
Benzodiazepines: NOT DETECTED
Cocaine: NOT DETECTED
Opiates: NOT DETECTED
Tetrahydrocannabinol: NOT DETECTED

## 2016-02-16 LAB — I-STAT BETA HCG BLOOD, ED (MC, WL, AP ONLY)

## 2016-02-16 LAB — ETHANOL: ALCOHOL ETHYL (B): 378 mg/dL — AB (ref <5)

## 2016-02-16 MED ORDER — ONDANSETRON HCL 4 MG/2ML IJ SOLN
4.0000 mg | Freq: Once | INTRAMUSCULAR | Status: AC
  Administered 2016-02-16: 4 mg via INTRAVENOUS
  Filled 2016-02-16: qty 2

## 2016-02-16 MED ORDER — SODIUM CHLORIDE 0.9 % IV BOLUS (SEPSIS)
1000.0000 mL | Freq: Once | INTRAVENOUS | Status: AC
  Administered 2016-02-16: 1000 mL via INTRAVENOUS

## 2016-02-16 MED ORDER — LORAZEPAM 1 MG PO TABS
1.0000 mg | ORAL_TABLET | Freq: Once | ORAL | Status: AC
  Administered 2016-02-16: 1 mg via ORAL
  Filled 2016-02-16: qty 1

## 2016-02-16 MED ORDER — METOCLOPRAMIDE HCL 5 MG/ML IJ SOLN
10.0000 mg | Freq: Once | INTRAMUSCULAR | Status: AC
  Administered 2016-02-16: 10 mg via INTRAVENOUS
  Filled 2016-02-16: qty 2

## 2016-02-16 NOTE — ED Provider Notes (Signed)
[facility]-EMERGENCY DEPT Provider Note   CSN: [id number] Arrival date & time: 02/16/16  1844  By signing my name below, I, Modena JanskyAlbert Thayil, attest that this documentation has been prepared under the direction and in the presence of non-physician practitioner, Melburn HakeNicole Nadeau, PA. Electronically Signed: Modena JanskyAlbert Thayil, Scribe. 02/16/2016. 8:19 PM.  History   Chief Complaint Chief Complaint  Patient presents with  . Alcohol Intoxication  . Arm Pain   The history is provided by the patient. No language interpreter was used.   HPI Comments: [name] Stewart is a 36 y.o. female who presents to the Emergency Department complaining of alcohol intoxication that started today. She finished alcoholic rehab last Saturday (7/82/951/13/18) and reports that she has been drinking appx. half a handle of vodka daily. She states she last had alcohol this morning, and she drank half a handle of vodka. Denies drug use. She called EMS because "I needed to stop drinking". She reports associated headache, bilateral arm pain and nausea. She denies any fever, chills, CP, SOB, , abdominal pain, tremors, anxiety, numbness, tingling, hallucinations,  or other complaints.    PCP: Verlon AuBoyd, Tammy Lamonica, MD  Past Medical History:  Diagnosis Date  . Alcohol related seizure (HCC)   . Alcoholic (HCC)   . Anxiety   . Bipolar disorder (HCC)   . Depression   . Post traumatic stress disorder (PTSD)   . Tension headache, chronic    "monthly" (01/31/2015)    Patient Active Problem List   Diagnosis Date Noted  . Withdrawal seizures (HCC) 08/31/2015  . Tachycardia 08/31/2015  . Alcohol intoxication (HCC) 08/13/2015  . Depression 05/29/2015  . Generalized anxiety disorder   . Transaminitis 02/17/2015  . Major depressive disorder, recurrent episode, severe (HCC) 02/05/2015  . PTSD (post-traumatic stress disorder) 12/14/2014  . Bipolar disorder with severe depression (HCC) 12/14/2014  . Alcohol use disorder, severe, dependence (HCC)  12/13/2014  . Metabolic acidosis 01/01/2014  . Alcohol withdrawal (HCC) 01/01/2014  . GERD (gastroesophageal reflux disease) 01/01/2014  . Coarse tremors 01/01/2014    Past Surgical History:  Procedure Laterality Date  . EXCISIONAL HEMORRHOIDECTOMY  20111  . OSTEOCHONDROMA EXCISION Right ~ 1996   "knee"  . REFRACTIVE SURGERY Bilateral ~ 2011    OB History    No data available       Home Medications    Prior to Admission medications   Medication Sig Start Date End Date Taking? Authorizing Provider  gabapentin (NEURONTIN) 300 MG capsule Take 300-600 mg by mouth 2 (two) times daily. 300 mg qam and 600 mg qhs 08/23/15  Yes Historical Provider, MD  lamoTRIgine (LAMICTAL) 200 MG tablet Take 200 mg by mouth 2 (two) times daily.  10/22/15  Yes Historical Provider, MD  Levonorgestrel-Ethinyl Estradiol (CAMRESE) 0.15-0.03 &0.01 MG tablet Take 1 tablet by mouth daily.   Yes Historical Provider, MD  lurasidone (LATUDA) 80 MG TABS tablet Take 40 mg by mouth daily with breakfast.    Yes Historical Provider, MD  Multiple Vitamin (MULTIVITAMIN WITH MINERALS) TABS tablet Take 1 tablet by mouth daily. 02/19/15  Yes Carly Arlyce HarmanJ Rivet, MD  naphazoline-glycerin (CLEAR EYES) 0.012-0.2 % SOLN Place 1 drop into both eyes 4 (four) times daily as needed for irritation.   Yes Historical Provider, MD  ondansetron (ZOFRAN) 4 MG tablet Take 1 tablet (4 mg total) by mouth every 8 (eight) hours as needed for nausea or vomiting. Patient taking differently: Take 4 mg by mouth at bedtime.  01/12/16  Yes Antony MaduraKelly Humes, PA-C  topiramate (  TOPAMAX) 50 MG tablet Take 50 mg by mouth 2 (two) times daily. 10/22/15  Yes Historical Provider, MD  valACYclovir (VALTREX) 500 MG tablet Take 1,000 mg by mouth daily.  07/12/15  Yes Historical Provider, MD  chlordiazePOXIDE (LIBRIUM) 25 MG capsule 50mg  PO TID x 1D, then 25-50mg  PO BID X 1D, then 25-50mg  PO QD X 1D 02/17/16   Barrett Henle, PA-C  folic acid (FOLVITE) 1 MG tablet Take  1 tablet (1 mg total) by mouth daily. Patient not taking: Reported on 01/11/2016 02/19/15   Su Hoff, MD  metoprolol tartrate (LOPRESSOR) 50 MG tablet Take 1 tablet (50 mg total) by mouth 2 (two) times daily. Patient not taking: Reported on 01/11/2016 08/14/15   Leroy Sea, MD  pantoprazole (PROTONIX) 40 MG tablet Take 1 tablet (40 mg total) by mouth daily. Patient not taking: Reported on 01/11/2016 08/14/15   Leroy Sea, MD  thiamine 100 MG tablet Take 1 tablet (100 mg total) by mouth daily. Patient not taking: Reported on 01/11/2016 02/19/15   Su Hoff, MD    Family History Family History  Problem Relation Age of Onset  . Anxiety disorder Mother   . Anxiety disorder Sister   . Anxiety disorder Maternal Grandmother   . Anxiety disorder Father   . Alcohol abuse Father   . Alcohol abuse Brother   . Alcohol abuse Paternal Uncle     Social History Social History  Substance Use Topics  . Smoking status: Never Smoker  . Smokeless tobacco: Never Used  . Alcohol use Yes     Comment: Wine and Vodka. Last drink: Today     Allergies   Naltrexone and Baclofen   Review of Systems Review of Systems  Gastrointestinal: Positive for nausea. Negative for abdominal pain.  Musculoskeletal: Positive for myalgias (RUE).  Neurological: Positive for headaches. Negative for tremors.  All other systems reviewed and are negative.    Physical Exam Updated Vital Signs BP 118/86   Pulse 113   Temp 98 F (36.7 C) (Oral)   Resp 14   SpO2 97%   Physical Exam  Constitutional: She is oriented to person, place, and time. She appears well-developed and well-nourished.  Slurred speech and smells of alcohol. Pt is able to answer questions appropriately and follow commands.   HENT:  Head: Normocephalic and atraumatic.  Mouth/Throat: Uvula is midline, oropharynx is clear and moist and mucous membranes are normal. No oropharyngeal exudate, posterior oropharyngeal edema, posterior  oropharyngeal erythema or tonsillar abscesses. No tonsillar exudate.  Eyes: Conjunctivae and EOM are normal. Pupils are equal, round, and reactive to light. Right eye exhibits no discharge. Left eye exhibits no discharge. No scleral icterus.  Neck: Normal range of motion. Neck supple.  Cardiovascular: Normal rate, regular rhythm, normal heart sounds and intact distal pulses.   Pulmonary/Chest: Effort normal and breath sounds normal. No respiratory distress. She has no wheezes. She has no rales. She exhibits no tenderness.  Abdominal: Soft. Bowel sounds are normal. She exhibits no distension and no mass. There is no tenderness. There is no rebound and no guarding. No hernia.  Musculoskeletal: She exhibits no edema.  No midline C, T, or L tenderness. Full range of motion of neck and back. Full range of motion of bilateral upper and lower extremities, with 5/5 strength. Sensation intact. 2+ radial and PT pulses. Cap refill <2 seconds.  Neurological: She is alert and oriented to person, place, and time. No sensory deficit.  Pt moving all  4 extremities spontaneously. No tremor noted with out-stretched arms.   Skin: Skin is warm and dry.  Nursing note and vitals reviewed.    ED Treatments / Results  DIAGNOSTIC STUDIES: Oxygen Saturation is 97% on RA, normal by my interpretation.    COORDINATION OF CARE: 8:23 PM- Pt advised of plan for treatment and pt agrees.  Labs (all labs ordered are listed, but only abnormal results are displayed) Labs Reviewed  CBC WITH DIFFERENTIAL/PLATELET - Abnormal; Notable for the following:       Result Value   Hemoglobin 15.3 (*)    All other components within normal limits  ETHANOL - Abnormal; Notable for the following:    Alcohol, Ethyl (B) 378 (*)    All other components within normal limits  COMPREHENSIVE METABOLIC PANEL - Abnormal; Notable for the following:    Albumin 5.1 (*)    AST 81 (*)    ALT 91 (*)    Anion gap 17 (*)    All other components  within normal limits  RAPID URINE DRUG SCREEN, HOSP PERFORMED  I-STAT BETA HCG BLOOD, ED ([facility], [facility], [facility] ONLY)    EKG  EKG Interpretation None       Radiology No results found.  Procedures Procedures (including critical care time)  Medications Ordered in ED Medications  sodium chloride 0.9 % bolus 1,000 mL (0 mLs Intravenous Stopped 02/16/16 2353)  ondansetron (ZOFRAN) injection 4 mg (4 mg Intravenous Given 02/16/16 2110)  LORazepam (ATIVAN) tablet 1 mg (1 mg Oral Given 02/16/16 2353)  metoCLOPramide (REGLAN) injection 10 mg (10 mg Intravenous Given 02/16/16 2352)  0.9 %  sodium chloride infusion ( Intravenous Stopped 02/17/16 0303)     Initial Impression / Assessment and Plan / ED Course  I have reviewed the triage vital signs and the nursing notes.  Pertinent labs & imaging results that were available during my care of the patient were reviewed by me and considered in my medical decision making (see chart for details).    Pt presents to the ED via EMS from home for alcohol detox. Reports she just finished outpatient rehab last week but states she has been drinking 1/2 handle of vodka daily since being home, last drank vodka this morning. Reports mild headache, aching pain to bilateral arms and nausea. Initial vitals revealed HR 113, otherwise stable. On exam patient appears intoxicated with slurred speech and smells of alcohol. A&Ox3, cooperative and following commands. CIWA score <8. Plan to order basic labs and order IVF and zofran. EtOH 378. UDS negative.  On reevalution, pt has sobered up however still continues to have mild tachycardia, HR 110, and reports mild nausea. Will give 2nd liter of IVF, reglan and reevaluate. Pt tolerating PO. Pt able to stand and ambulate and appears clinically sober. On reevaluation s/p IVF, HR 88. Plan to d/c pt home with librium taper and pt given info for outpatient detox resources. Nurse reports pt became agitated prior to discharge due to getting  frustrated because she has a ride waiting which is when pt's HR became slightly elevated on repeated vitals. On reevaluation prior to d/c, HR 92. Pt is hemodynamically stable for d/c.   Final Clinical Impressions(s) / ED Diagnoses   Final diagnoses:  Alcoholic intoxication without complication [facility]    New Prescriptions Discharge Medication List as of 02/17/2016  3:19 AM     I personally performed the services described in this documentation, which was scribed in my presence. The recorded information has been reviewed and  is accurate.     Satira Sark [place], [place] 02/17/16 0542    Jacalyn Lefevre, MD 02/17/16 [id number]

## 2016-02-16 NOTE — ED Triage Notes (Signed)
Per EMs- Patient reports that she drank an unknown amount of alcohol. Patient was recently discharged from rehab. Patient c/o bil arm pain. Patient states she has had multiple falls when intoxicated.

## 2016-02-17 MED ORDER — CHLORDIAZEPOXIDE HCL 25 MG PO CAPS: ORAL_CAPSULE | ORAL | 0 refills | Status: DC

## 2016-02-17 MED ORDER — SODIUM CHLORIDE 0.9 % IV SOLN
Freq: Once | INTRAVENOUS | Status: AC
  Administered 2016-02-17: 01:00:00 via INTRAVENOUS

## 2016-02-17 NOTE — Discharge Instructions (Signed)
Take your medication as prescribed. Do not use opiates while you're taking this medication due to severe side effects.  I recommend following up with one of the outpatient substance abuse clinics listed below for further help regarding alcohol detox. Return to emergency department if symptoms worsen or new onset of fever, headache, dizziness, hallucinations, tremors, vomiting, chest pain, difficulty breathing, seizures

## 2016-02-26 ENCOUNTER — Emergency Department (HOSPITAL_COMMUNITY): Payer: 59

## 2016-02-26 ENCOUNTER — Emergency Department (HOSPITAL_COMMUNITY)
Admission: EM | Admit: 2016-02-26 | Discharge: 2016-02-26 | Disposition: A | Discharge status: Home / Self Care | Payer: 59 | Attending: Emergency Medicine | Admitting: Emergency Medicine

## 2016-02-26 ENCOUNTER — Encounter (HOSPITAL_COMMUNITY): Payer: Self-pay

## 2016-02-26 DIAGNOSIS — Z79899 Other long term (current) drug therapy: Secondary | ICD-10-CM | POA: Diagnosis not present

## 2016-02-26 DIAGNOSIS — F1010 Alcohol abuse, uncomplicated: Secondary | ICD-10-CM | POA: Diagnosis present

## 2016-02-26 DIAGNOSIS — F10939 Alcohol use, unspecified with withdrawal, unspecified: Secondary | ICD-10-CM

## 2016-02-26 DIAGNOSIS — F10239 Alcohol dependence with withdrawal, unspecified: Secondary | ICD-10-CM | POA: Insufficient documentation

## 2016-02-26 LAB — ACETAMINOPHEN LEVEL

## 2016-02-26 LAB — COMPREHENSIVE METABOLIC PANEL
ALT: 180 U/L — ABNORMAL HIGH (ref 14–54)
ANION GAP: 16 — AB (ref 5–15)
AST: 96 U/L — ABNORMAL HIGH (ref 15–41)
Albumin: 4.7 g/dL (ref 3.5–5.0)
Alkaline Phosphatase: 38 U/L (ref 38–126)
BUN: 11 mg/dL (ref 6–20)
CO2: 19 mmol/L — ABNORMAL LOW (ref 22–32)
Calcium: 8.7 mg/dL — ABNORMAL LOW (ref 8.9–10.3)
Chloride: 105 mmol/L (ref 101–111)
Creatinine, Ser: 0.66 mg/dL (ref 0.44–1.00)
GFR calc non Af Amer: >60 mL/min (ref >60)
Glucose, Bld: 115 mg/dL — ABNORMAL HIGH (ref 65–99)
POTASSIUM: 4 mmol/L (ref 3.5–5.1)
SODIUM: 140 mmol/L (ref 135–145)
TOTAL PROTEIN: 8.2 g/dL — AB (ref 6.5–8.1)
Total Bilirubin: 0.6 mg/dL (ref 0.3–1.2)

## 2016-02-26 LAB — I-STAT BETA HCG BLOOD, ED (MC, WL, AP ONLY): I-stat hCG, quantitative: <5 m[IU]/mL (ref <5)

## 2016-02-26 LAB — RAPID URINE DRUG SCREEN, HOSP PERFORMED
Amphetamines: NOT DETECTED
BARBITURATES: NOT DETECTED
Benzodiazepines: POSITIVE — AB
COCAINE: NOT DETECTED
OPIATES: NOT DETECTED
Tetrahydrocannabinol: NOT DETECTED

## 2016-02-26 LAB — CBC
HCT: 38.9 % (ref 36.0–46.0)
Hemoglobin: 13.4 g/dL (ref 12.0–15.0)
MCH: 32.1 pg (ref 26.0–34.0)
MCHC: 34.4 g/dL (ref 30.0–36.0)
MCV: 93.1 fL (ref 78.0–100.0)
PLATELETS: 245 10*3/uL (ref 150–400)
RBC: 4.18 MIL/uL (ref 3.87–5.11)
RDW: 14.3 % (ref 11.5–15.5)
WBC: 7 10*3/uL (ref 4.0–10.5)

## 2016-02-26 LAB — ETHANOL: Alcohol, Ethyl (B): 337 mg/dL (ref <5)

## 2016-02-26 LAB — SALICYLATE LEVEL

## 2016-02-26 MED ORDER — SODIUM CHLORIDE 0.9 % IV BOLUS (SEPSIS)
1000.0000 mL | Freq: Once | INTRAVENOUS | Status: AC
  Administered 2016-02-26: 1000 mL via INTRAVENOUS

## 2016-02-26 MED ORDER — CHLORDIAZEPOXIDE HCL 25 MG PO CAPS: ORAL_CAPSULE | ORAL | 0 refills | Status: AC

## 2016-02-26 MED ORDER — LORAZEPAM 2 MG/ML IJ SOLN
1.0000 mg | Freq: Once | INTRAMUSCULAR | Status: AC
  Administered 2016-02-26: 1 mg via INTRAVENOUS
  Filled 2016-02-26: qty 1

## 2016-02-26 MED ORDER — CHLORDIAZEPOXIDE HCL 25 MG PO CAPS: ORAL_CAPSULE | ORAL | 0 refills | Status: DC

## 2016-02-26 NOTE — ED Notes (Signed)
Pt dressed in scrubs, belongings secure in LOCK[employer] 31.

## 2016-02-26 NOTE — ED Triage Notes (Signed)
Per EMS, called out for wellness check.  Family attempted to reach patient with no response.  Pt has hx of ETOH addiction.  Pt came to door and answered.  Pt is lethargic.  Pt states she had a fall and has rib cage pain.  Pt cbg 64, 15g glucose given in route.  Vitals: 132/90, hr 150, resp 16, 98% ra.

## 2016-02-26 NOTE — ED Provider Notes (Signed)
Patient care seemed from Dr. Anitra LauthPlunkett. Please see her note for further H&P. Briefly, patient presents to the ED due to excessive alcohol intake and suicidal ideation. She denied any suicidal 8 deviation, but states she thought about dying today while drinking. Labs remarkable for ethanol of 337. Slightly elevated LFTs likely due to her alcohol abuse. Plan to reassess once clinically sober.  Patient reassessed. She denies any suicidal ideation or suicidal plan. She is not homicidal. Given 1 mg Ativan.  She is able to ambulate in ED without difficulty and is clinically sober.  Discussed follow up with PCP.  Strict return precautions including any thoughts of self harm.  Home with Librium taper.  Stable for discharge.    Cheri FowlerKayla Marquette Blodgett, PA-C 02/26/16 2128    Gwyneth SproutWhitney Plunkett, MD 02/27/16 2135

## 2016-02-26 NOTE — ED Provider Notes (Signed)
[facility]-EMERGENCY DEPT Provider Note   CSN: [id number] Arrival date & time: 02/26/16  1321     History   Chief Complaint Chief Complaint  Patient presents with  . Alcohol Problem  . Suicidal    HPI [name] Stewart is a 36 y.o. female.  Patient is a 36 year old female with a history of alcoholism, bipolar disease, depression and anxiety being brought in today by EMS for concern for suicidal ideation. Patient states she's been drinking heavily for the last 3-4 days. She was in rehabilitation but when she got out her life was still terrible which caused her to start drinking again. She has been falling a lot since she has been drinking too much and complaining of arm, leg and chest pain. She also fell on her face last night. She denies any loss of consciousness, shortness of breath, abdominal pain or vomiting. She currently denies suicidal ideation but apparently states she thought about dying today while drinking with her sister.   The history is provided by the patient.  Alcohol Problem  This is a recurrent problem. Episode onset: drinking heavily for the last few days, over a bottle of wine. The problem occurs constantly. The problem has not changed since onset.Associated symptoms include chest pain. Associated symptoms comments: Arm and leg pain.  States she has been falling a lot over the last few days due to drinking too much.  States she is having arm and leg pain and chest pain after fall.  Also states she fell face first last night.  No numbness, weakness or neck pain.  Pt currently denies SI but apparently mentioned dying to her sister who got scared.  Pt stopped drinking when they brought her here.. Nothing aggravates the symptoms. Nothing relieves the symptoms. She has tried nothing for the symptoms.    Past Medical History:  Diagnosis Date  . Alcohol related seizure (HCC)   . Alcoholic (HCC)   . Anxiety   . Bipolar disorder (HCC)   . Depression   . Post traumatic stress disorder  (PTSD)   . Tension headache, chronic    "monthly" (01/31/2015)    Patient Active Problem List   Diagnosis Date Noted  . Withdrawal seizures (HCC) 08/31/2015  . Tachycardia 08/31/2015  . Alcohol intoxication (HCC) 08/13/2015  . Depression 05/29/2015  . Generalized anxiety disorder   . Transaminitis 02/17/2015  . Major depressive disorder, recurrent episode, severe (HCC) 02/05/2015  . PTSD (post-traumatic stress disorder) 12/14/2014  . Bipolar disorder with severe depression (HCC) 12/14/2014  . Alcohol use disorder, severe, dependence (HCC) 12/13/2014  . Metabolic acidosis 01/01/2014  . Alcohol withdrawal (HCC) 01/01/2014  . GERD (gastroesophageal reflux disease) 01/01/2014  . Coarse tremors 01/01/2014    Past Surgical History:  Procedure Laterality Date  . EXCISIONAL HEMORRHOIDECTOMY  20111  . OSTEOCHONDROMA EXCISION Right ~ 1996   "knee"  . REFRACTIVE SURGERY Bilateral ~ 2011    OB History    No data available       Home Medications    Prior to Admission medications   Medication Sig Start Date End Date Taking? Authorizing Provider  chlordiazePOXIDE (LIBRIUM) 25 MG capsule 50mg  PO TID x 1D, then 25-50mg  PO BID X 1D, then 25-50mg  PO QD X 1D 02/17/16   Barrett Henle, PA-C  folic acid (FOLVITE) 1 MG tablet Take 1 tablet (1 mg total) by mouth daily. Patient not taking: Reported on 01/11/2016 02/19/15   Iris Pert Rivet, MD  gabapentin (NEURONTIN) 300 MG capsule Take 300-600 mg  by mouth 2 (two) times daily. 300 mg qam and 600 mg qhs 08/23/15   Historical Provider, MD  lamoTRIgine (LAMICTAL) 200 MG tablet Take 200 mg by mouth 2 (two) times daily.  10/22/15   Historical Provider, MD  Levonorgestrel-Ethinyl Estradiol (CAMRESE) 0.15-0.03 &0.01 MG tablet Take 1 tablet by mouth daily.    Historical Provider, MD  lurasidone (LATUDA) 80 MG TABS tablet Take 40 mg by mouth daily with breakfast.     Historical Provider, MD  metoprolol tartrate (LOPRESSOR) 50 MG tablet Take 1 tablet  (50 mg total) by mouth 2 (two) times daily. Patient not taking: Reported on 01/11/2016 08/14/15   Leroy SeaPrashant K Singh, MD  Multiple Vitamin (MULTIVITAMIN WITH MINERALS) TABS tablet Take 1 tablet by mouth daily. 02/19/15   Su Hoffarly J Rivet, MD  naphazoline-glycerin (CLEAR EYES) 0.012-0.2 % SOLN Place 1 drop into both eyes 4 (four) times daily as needed for irritation.    Historical Provider, MD  ondansetron (ZOFRAN) 4 MG tablet Take 1 tablet (4 mg total) by mouth every 8 (eight) hours as needed for nausea or vomiting. Patient taking differently: Take 4 mg by mouth at bedtime.  01/12/16   Antony MaduraKelly Humes, PA-C  pantoprazole (PROTONIX) 40 MG tablet Take 1 tablet (40 mg total) by mouth daily. Patient not taking: Reported on 01/11/2016 08/14/15   Leroy SeaPrashant K Singh, MD  thiamine 100 MG tablet Take 1 tablet (100 mg total) by mouth daily. Patient not taking: Reported on 01/11/2016 02/19/15   Iris Pertarly J Rivet, MD  topiramate (TOPAMAX) 50 MG tablet Take 50 mg by mouth 2 (two) times daily. 10/22/15   Historical Provider, MD  valACYclovir (VALTREX) 500 MG tablet Take 1,000 mg by mouth daily.  07/12/15   Historical Provider, MD    Family History Family History  Problem Relation Age of Onset  . Anxiety disorder Mother   . Anxiety disorder Sister   . Anxiety disorder Maternal Grandmother   . Anxiety disorder Father   . Alcohol abuse Father   . Alcohol abuse Brother   . Alcohol abuse Paternal Uncle     Social History Social History  Substance Use Topics  . Smoking status: Never Smoker  . Smokeless tobacco: Never Used  . Alcohol use Yes     Comment: Wine and Vodka. Last drink: Today     Allergies   Naltrexone and Baclofen   Review of Systems Review of Systems  Cardiovascular: Positive for chest pain.  All other systems reviewed and are negative.    Physical Exam Updated Vital Signs BP 120/76 (BP Location: Left Arm)   Pulse (!) 122   Temp 98.6 F (37 C) (Oral)   Resp 15   Ht 5\' 4"  (1.626 m)   Wt 125  lb (56.7 kg)   SpO2 95%   BMI 21.46 kg/m   Physical Exam  Constitutional: She is oriented to person, place, and time. She appears well-developed and well-nourished. No distress.  HENT:  Head: Normocephalic and atraumatic.  Mouth/Throat: Oropharynx is clear and moist.  Eyes: Conjunctivae and EOM are normal. Pupils are equal, round, and reactive to light.  Neck: Normal range of motion. Neck supple.  Cardiovascular: Regular rhythm and intact distal pulses.  Tachycardia present.   No murmur heard. Pulmonary/Chest: Effort normal and breath sounds normal. No respiratory distress. She has no wheezes. She has no rales.    Abdominal: Soft. She exhibits no distension. There is no tenderness. There is no rebound and no guarding.  Musculoskeletal: Normal range of  motion. She exhibits tenderness. She exhibits no edema.  Diffuse ecchymosis in various stages of healing over the upper extremities  Neurological: She is alert and oriented to person, place, and time.  Skin: Skin is warm and dry. No rash noted. No erythema.  Psychiatric: She exhibits a depressed mood. She expresses no homicidal and no suicidal ideation.  Patient is currently intoxicated  Nursing note and vitals reviewed.    ED Treatments / Results  Labs (all labs ordered are listed, but only abnormal results are displayed) Labs Reviewed  COMPREHENSIVE METABOLIC PANEL - Abnormal; Notable for the following:       Result Value   CO2 19 (*)    Glucose, Bld 115 (*)    Calcium 8.7 (*)    Total Protein 8.2 (*)    AST 96 (*)    ALT 180 (*)    Anion gap 16 (*)    All other components within normal limits  ETHANOL - Abnormal; Notable for the following:    Alcohol, Ethyl (B) 337 (*)    All other components within normal limits  ACETAMINOPHEN LEVEL - Abnormal; Notable for the following:    Acetaminophen (Tylenol), Serum <10 (*)    All other components within normal limits  RAPID URINE DRUG SCREEN, HOSP PERFORMED - Abnormal; Notable  for the following:    Benzodiazepines POSITIVE (*)    All other components within normal limits  SALICYLATE LEVEL  CBC  I-STAT BETA HCG BLOOD, ED ([facility], [facility], [facility] ONLY)    EKG  EKG Interpretation None       Radiology Dg Chest 2 View  Result Date: 02/26/2016 CLINICAL DATA:  Recent fall, mid chest pain EXAM: CHEST  2 VIEW COMPARISON:  Chest x-ray of 05/29/2015 FINDINGS: No active infiltrate or effusion is seen. No pneumothorax is noted. Mediastinal and hilar contours are unremarkable. The heart is within normal limits in size. No bony abnormality is seen. IMPRESSION: No active cardiopulmonary disease. Electronically Signed   By: Dwyane Dee M.D.   On: 02/26/2016 16:58    Procedures Procedures (including critical care time)  Medications Ordered in ED Medications  sodium chloride 0.9 % bolus 1,000 mL (not administered)     Initial Impression / Assessment and Plan / ED Course  I have reviewed the triage vital signs and the nursing notes.  Pertinent labs & imaging results that were available during my care of the patient were reviewed by me and considered in my medical decision making (see chart for details).     Patient presenting today with obvious alcohol intoxication, dehydration and apparently suicidal ideation at home but denying suicidal ideation currently. Patient is complaining of diffuse aches and pains from recurrent falls due to ongoing intoxication. She currently denies any abdominal pain or vomiting but is having some chest wall tenderness. Labs are significant for alcohol level of 337, mild metabolic acidosis with a gap of 16, urine positive for benzodiazepine and elevated LFTs which is most likely related to her heavy alcohol consumption. Patient will be given IV fluids. Also x-ray to evaluate for rib fracture.   5:20 PM CXR wnl.  Pt will need to sober up and then will re-eval.  Final Clinical Impressions(s) / ED Diagnoses   Final diagnoses:  None    New  Prescriptions New Prescriptions   No medications on file     Gwyneth Sprout, MD 02/26/16 1720

## 2016-02-26 NOTE — ED Notes (Signed)
Pt ambulatory to restroom without assistance.  Pt exhibiting tremors in hands and feet.

## 2016-02-26 NOTE — ED Notes (Signed)
Patient given dinner tray.  Alert and cooperative at this time.

## 2016-02-26 NOTE — ED Notes (Signed)
ED Provider at bedside. 

## 2016-02-26 NOTE — ED Notes (Signed)
Pt reports that she has been drinking a bottle of wine daily, denies SI/HI/AVH is requesting ETOH detox.

## 2016-02-26 NOTE — Discharge Instructions (Signed)
Take librium to help with alcohol detox.  DO NOT DRINK WHILE TAKING THIS MEDICATION AS THIS CAN CAUSE SLOWED BREATHING AND DEATH. Follow up with your primary care physician.  Follow up with an outpatient rehab facility using the resources provided.  Return to the ED for any new or concerning symptoms or feelings like you may harm yourself or others.

## 2016-02-26 NOTE — ED Notes (Signed)
Pt ambulatory and independent at discharge.  Verbalized understanding of discharge instructions and importance of not consuming alcohol while using librium.

## 2016-02-26 NOTE — ED Triage Notes (Addendum)
PT STS SHE TRIED REHAB FOR 30 DAYS, BUT HER LIFE STILL "SUCKED" WHEN SHE GOT OUT, SO SHE HAS BEEN DRINKING SINCE. SHE STS SHE WANTED TO DIE YESTERDAY, AND "SORT OF" FEELS LIKE DYING TODAY. PT STS SHE HAS BEEN DRINKING WINE ALL DAY.

## 2016-02-26 NOTE — ED Notes (Signed)
Etoh 337

## 2016-03-06 ENCOUNTER — Emergency Department (HOSPITAL_COMMUNITY)
Admission: EM | Admit: 2016-03-06 | Discharge: 2016-03-07 | Disposition: A | Discharge status: Home / Self Care | Payer: BLUE CROSS/BLUE SHIELD | Attending: Emergency Medicine | Admitting: Emergency Medicine

## 2016-03-06 ENCOUNTER — Emergency Department (HOSPITAL_COMMUNITY): Payer: BLUE CROSS/BLUE SHIELD

## 2016-03-06 DIAGNOSIS — W1830XA Fall on same level, unspecified, initial encounter: Secondary | ICD-10-CM | POA: Insufficient documentation

## 2016-03-06 DIAGNOSIS — F10920 Alcohol use, unspecified with intoxication, uncomplicated: Secondary | ICD-10-CM

## 2016-03-06 DIAGNOSIS — Y999 Unspecified external cause status: Secondary | ICD-10-CM | POA: Insufficient documentation

## 2016-03-06 DIAGNOSIS — M79632 Pain in left forearm: Secondary | ICD-10-CM | POA: Diagnosis not present

## 2016-03-06 DIAGNOSIS — Y929 Unspecified place or not applicable: Secondary | ICD-10-CM | POA: Insufficient documentation

## 2016-03-06 DIAGNOSIS — S0083XA Contusion of other part of head, initial encounter: Secondary | ICD-10-CM

## 2016-03-06 DIAGNOSIS — Y939 Activity, unspecified: Secondary | ICD-10-CM | POA: Diagnosis not present

## 2016-03-06 DIAGNOSIS — Z79899 Other long term (current) drug therapy: Secondary | ICD-10-CM | POA: Diagnosis not present

## 2016-03-06 DIAGNOSIS — F10129 Alcohol abuse with intoxication, unspecified: Secondary | ICD-10-CM | POA: Insufficient documentation

## 2016-03-06 DIAGNOSIS — S0990XA Unspecified injury of head, initial encounter: Secondary | ICD-10-CM | POA: Diagnosis present

## 2016-03-06 LAB — I-STAT BETA HCG BLOOD, ED (MC, WL, AP ONLY): I-stat hCG, quantitative: <5 m[IU]/mL (ref <5)

## 2016-03-06 MED ORDER — ACETAMINOPHEN 325 MG PO TABS
650.0000 mg | ORAL_TABLET | Freq: Once | ORAL | Status: AC
  Administered 2016-03-06: 650 mg via ORAL
  Filled 2016-03-06: qty 2

## 2016-03-06 MED ORDER — LORAZEPAM 2 MG/ML IJ SOLN
1.0000 mg | Freq: Once | INTRAMUSCULAR | Status: AC
  Administered 2016-03-06: 1 mg via INTRAVENOUS
  Filled 2016-03-06: qty 1

## 2016-03-06 MED ORDER — SODIUM CHLORIDE 0.9 % IV BOLUS (SEPSIS)
1000.0000 mL | Freq: Once | INTRAVENOUS | Status: AC
  Administered 2016-03-06: 1000 mL via INTRAVENOUS

## 2016-03-06 MED ORDER — LORAZEPAM 2 MG/ML IJ SOLN: 2.0000 mg | Freq: Once | INTRAMUSCULAR | Status: DC

## 2016-03-06 NOTE — ED Notes (Signed)
Bed: WTR6 Expected date:  Expected time:  Means of arrival:  Comments: EMS 

## 2016-03-06 NOTE — ED Triage Notes (Signed)
Pt states that she fell several times this morning, forward and on her L side. No blood thinners. Hematoma noted to R forehead. Pain to L arm and knee. Alcohol Intoxication. Alert and oriented.

## 2016-03-06 NOTE — ED Provider Notes (Signed)
[facility]-EMERGENCY DEPT Provider Note   CSN: [id number] Arrival date & time: 03/06/16  2027  By signing my name below, I, Modena Jansky, attest that this documentation has been prepared under the direction and in the presence of non-physician practitioner, Antony Madura, PA-C. Electronically Signed: Modena Jansky, Scribe. 03/06/2016. 10:11 PM.   History   Chief Complaint Chief Complaint  Patient presents with  . Fall  . Alcohol Intoxication   The history is provided by the patient. The history is limited by the condition of the patient. No language interpreter was used.    LEVEL 5 CAVEAT DUE TO ALCOHOL INTOXICATION  HPI Comments: [name] Stewart is a 36 y.o. female with a PMHx of alcoholism who presents to the Emergency Department complaining of several fall that occurred this morning. She has had several ED visits over the past month for alcohol intoxication. She states she had wine to drink today, unspecified amount. She currently has a headache and LUE pain. She denies any SI, HI, or other complaints.   PCP: Verlon Au, MD   Past Medical History:  Diagnosis Date  . Alcohol related seizure (HCC)   . Alcoholic (HCC)   . Anxiety   . Bipolar disorder (HCC)   . Depression   . Post traumatic stress disorder (PTSD)   . Tension headache, chronic    "monthly" (01/31/2015)    Patient Active Problem List   Diagnosis Date Noted  . Withdrawal seizures (HCC) 08/31/2015  . Tachycardia 08/31/2015  . Alcohol intoxication (HCC) 08/13/2015  . Depression 05/29/2015  . Generalized anxiety disorder   . Transaminitis 02/17/2015  . Major depressive disorder, recurrent episode, severe (HCC) 02/05/2015  . PTSD (post-traumatic stress disorder) 12/14/2014  . Bipolar disorder with severe depression (HCC) 12/14/2014  . Alcohol use disorder, severe, dependence (HCC) 12/13/2014  . Metabolic acidosis 01/01/2014  . Alcohol withdrawal (HCC) 01/01/2014  . GERD (gastroesophageal reflux disease)  01/01/2014  . Coarse tremors 01/01/2014    Past Surgical History:  Procedure Laterality Date  . EXCISIONAL HEMORRHOIDECTOMY  20111  . OSTEOCHONDROMA EXCISION Right ~ 1996   "knee"  . REFRACTIVE SURGERY Bilateral ~ 2011    OB History    No data available       Home Medications    Prior to Admission medications   Medication Sig Start Date End Date Taking? Authorizing Provider  acamprosate (CAMPRAL) 333 MG tablet Take 666 mg by mouth 3 (three) times daily.  02/21/16   Historical Provider, MD  chlordiazePOXIDE (LIBRIUM) 25 MG capsule 50mg  PO TID x 1D, then 25-50mg  PO BID X 1D, then 25-50mg  PO QD X 1D 02/26/16   Cheri Fowler, PA-C  folic acid (FOLVITE) 1 MG tablet Take 1 tablet (1 mg total) by mouth daily. Patient not taking: Reported on 01/11/2016 02/19/15   Iris Pert Rivet, MD  gabapentin (NEURONTIN) 300 MG capsule Take 300-600 mg by mouth 2 (two) times daily. 300 mg qam and 600 mg qhs 08/23/15   Historical Provider, MD  lamoTRIgine (LAMICTAL) 200 MG tablet Take 200 mg by mouth daily.    Historical Provider, MD  Levonorgestrel-Ethinyl Estradiol (CAMRESE) 0.15-0.03 &0.01 MG tablet Take 1 tablet by mouth daily.    Historical Provider, MD  lurasidone (LATUDA) 40 MG TABS tablet Take 40 mg by mouth every evening.  02/21/16 03/22/16  Historical Provider, MD  metoprolol tartrate (LOPRESSOR) 50 MG tablet Take 1 tablet (50 mg total) by mouth 2 (two) times daily. Patient not taking: Reported on 01/11/2016 08/14/15   Bess Harvest  Curlene Labrum, MD  Multiple Vitamin (MULTIVITAMIN WITH MINERALS) TABS tablet Take 1 tablet by mouth daily. 02/19/15   Carly Arlyce Harman, MD  naltrexone (DEPADE) 50 MG tablet Take 50 mg by mouth daily.  02/21/16 02/20/17  Historical Provider, MD  naphazoline-glycerin (CLEAR EYES) 0.012-0.2 % SOLN Place 1 drop into both eyes 4 (four) times daily as needed for irritation.    Historical Provider, MD  ondansetron (ZOFRAN) 4 MG tablet Take 1 tablet (4 mg total) by mouth every 8 (eight) hours as needed  for nausea or vomiting. Patient taking differently: Take 4 mg by mouth at bedtime.  01/12/16   Antony Madura, PA-C  pantoprazole (PROTONIX) 40 MG tablet Take 1 tablet (40 mg total) by mouth daily. Patient not taking: Reported on 01/11/2016 08/14/15   Leroy Sea, MD  thiamine 100 MG tablet Take 1 tablet (100 mg total) by mouth daily. Patient not taking: Reported on 01/11/2016 02/19/15   Iris Pert Rivet, MD  topiramate (TOPAMAX) 50 MG tablet Take 50 mg by mouth 2 (two) times daily. 10/22/15   Historical Provider, MD  valACYclovir (VALTREX) 500 MG tablet Take 1,000 mg by mouth daily.  07/12/15   Historical Provider, MD    Family History Family History  Problem Relation Age of Onset  . Anxiety disorder Mother   . Anxiety disorder Sister   . Anxiety disorder Maternal Grandmother   . Anxiety disorder Father   . Alcohol abuse Father   . Alcohol abuse Brother   . Alcohol abuse Paternal Uncle     Social History Social History  Substance Use Topics  . Smoking status: Never Smoker  . Smokeless tobacco: Never Used  . Alcohol use Yes     Comment: Wine and Vodka. Last drink: Today     Allergies   Naltrexone and Baclofen   Review of Systems Review of Systems  Unable to perform ROS: Other    Physical Exam Updated Vital Signs BP 119/77 (BP Location: Right Arm)   Pulse 118   Temp 98.8 F (37.1 C) (Oral)   Resp 18   SpO2 96%   Physical Exam  Constitutional: She is oriented to person, place, and time. She appears well-developed and well-nourished. No distress.  Nontoxic appearing; sleeping on initial presentation  HENT:  Head: Normocephalic. Head is without raccoon's eyes and without Battle's sign.    Contusion to right forehead. No skull instability, battle's sign, or raccoon's eyes.  Eyes: Conjunctivae and EOM are normal. No scleral icterus.  Neck: Normal range of motion.  Normal ROM appreciated. No bony deformities, step offs, or crepitus to cervical spine.  Cardiovascular:  Regular rhythm and intact distal pulses.   Tachycardia  Pulmonary/Chest: Effort normal. No respiratory distress. She has no wheezes.  Lungs CTAB  Musculoskeletal: Normal range of motion.       Left elbow: Normal.       Left wrist: Normal.       Left forearm: She exhibits tenderness and bony tenderness. She exhibits no swelling, no edema and no deformity.  Neurological: She is alert and oriented to person, place, and time. She exhibits normal muscle tone. Coordination normal.  Skin: Skin is warm and dry. No rash noted. She is not diaphoretic. No erythema. No pallor.  Psychiatric: She has a normal mood and affect. Her behavior is normal. Her speech is slurred.  Nursing note and vitals reviewed.    ED Treatments / Results  DIAGNOSTIC STUDIES: Oxygen Saturation is 96% on RA, normal by my  interpretation.    COORDINATION OF CARE: 10:15 PM- Pt advised of plan for treatment and pt agrees.  Labs (all labs ordered are listed, but only abnormal results are displayed) Labs Reviewed  I-STAT CHEM 8, ED - Abnormal; Notable for the following:       Result Value   Calcium, Ion 0.93 (*)    Hemoglobin 11.2 (*)    HCT 33.0 (*)    All other components within normal limits  I-STAT BETA HCG BLOOD, ED ([facility], [facility], [facility] ONLY)    EKG  EKG Interpretation None       Radiology Dg Forearm Left  Result Date: 03/06/2016 CLINICAL DATA:  Pain after fall EXAM: LEFT FOREARM - 2 VIEW COMPARISON:  None. FINDINGS: There is no evidence of fracture or other focal bone lesions. Soft tissues are unremarkable. IMPRESSION: Negative. Electronically Signed   By: Tollie Eth M.D.   On: 03/06/2016 22:37   Ct Head Wo Contrast  Result Date: 03/06/2016 CLINICAL DATA:  36 y/o F; history of alcoholism presenting after a fall with complaints of headache and left upper extremity pain. EXAM: CT HEAD WITHOUT CONTRAST CT CERVICAL SPINE WITHOUT CONTRAST TECHNIQUE: Multidetector CT imaging of the head and cervical spine was performed  following the standard protocol without intravenous contrast. Multiplanar CT image reconstructions of the cervical spine were also generated. COMPARISON:  01/11/2016 CT head.  08/29/2015 CT cervical spine. FINDINGS: CT HEAD FINDINGS Brain: No evidence of acute infarction, hemorrhage, hydrocephalus, extra-axial collection or mass lesion/mass effect. Vascular: No hyperdense vessel or unexpected calcification. Skull: Right frontal scalp thinning and soft tissue probably representing scarring or skin lesion, stable from prior study. No large scalp hematoma. No displaced calvarial fracture. Sinuses/Orbits: No acute finding. Other: High-riding left jugular bulb with thin sigmoid plate. CT CERVICAL SPINE FINDINGS Alignment: Normal. Skull base and vertebrae: No acute fracture. No primary bone lesion or focal pathologic process. Soft tissues and spinal canal: No prevertebral fluid or swelling. No visible canal hematoma. Disc levels:  No significant cervical degenerative changes. Upper chest: Negative. Other: Negative. IMPRESSION: 1. No acute intracranial abnormality identified. 2. Right frontal scalp thinning and soft tissue probably representing scarring or skin lesion, stable from prior study. 3. No displaced calvarial fracture. 4. No acute fracture or dislocation of cervical spine. Electronically Signed   By: Mitzi Hansen M.D.   On: 03/06/2016 22:50   Ct Cervical Spine Wo Contrast  Result Date: 03/06/2016 CLINICAL DATA:  37 y/o F; history of alcoholism presenting after a fall with complaints of headache and left upper extremity pain. EXAM: CT HEAD WITHOUT CONTRAST CT CERVICAL SPINE WITHOUT CONTRAST TECHNIQUE: Multidetector CT imaging of the head and cervical spine was performed following the standard protocol without intravenous contrast. Multiplanar CT image reconstructions of the cervical spine were also generated. COMPARISON:  01/11/2016 CT head.  08/29/2015 CT cervical spine. FINDINGS: CT HEAD FINDINGS  Brain: No evidence of acute infarction, hemorrhage, hydrocephalus, extra-axial collection or mass lesion/mass effect. Vascular: No hyperdense vessel or unexpected calcification. Skull: Right frontal scalp thinning and soft tissue probably representing scarring or skin lesion, stable from prior study. No large scalp hematoma. No displaced calvarial fracture. Sinuses/Orbits: No acute finding. Other: High-riding left jugular bulb with thin sigmoid plate. CT CERVICAL SPINE FINDINGS Alignment: Normal. Skull base and vertebrae: No acute fracture. No primary bone lesion or focal pathologic process. Soft tissues and spinal canal: No prevertebral fluid or swelling. No visible canal hematoma. Disc levels:  No significant cervical degenerative changes. Upper chest: Negative.  Other: Negative. IMPRESSION: 1. No acute intracranial abnormality identified. 2. Right frontal scalp thinning and soft tissue probably representing scarring or skin lesion, stable from prior study. 3. No displaced calvarial fracture. 4. No acute fracture or dislocation of cervical spine. Electronically Signed   By: Mitzi HansenLance  Furusawa-Stratton M.D.   On: 03/06/2016 22:50    Procedures Procedures (including critical care time)  Medications Ordered in ED Medications  acetaminophen (TYLENOL) tablet 650 mg (650 mg Oral Given 03/06/16 2317)  sodium chloride 0.9 % bolus 1,000 mL (0 mLs Intravenous Stopped 03/07/16 0040)  LORazepam (ATIVAN) injection 1 mg (1 mg Intravenous Given 03/06/16 2301)  LORazepam (ATIVAN) injection 1 mg (1 mg Intravenous Given 03/07/16 0032)  sodium chloride 0.9 % bolus 1,000 mL (0 mLs Intravenous Stopped 03/07/16 0158)     Initial Impression / Assessment and Plan / ED Course  I have reviewed the triage vital signs and the nursing notes.  Pertinent labs & imaging results that were available during my care of the patient were reviewed by me and considered in my medical decision making (see chart for details).     36 year old  female presents to the emergency department for evaluation of a fall secondary to chronic alcohol abuse. Patient reports drinking an undisclosed amount of wine today. She is visibly intoxicated.  Patient initially tachycardic on arrival. This has improved with Ativan and IV fluids. Patient has been allowed to sleep and sober in the emergency department. Head and cervical spine imaging was obtained which shows no evidence of acute traumatic injury. Patient also complaining of pain to her left forearm. Forearm x-ray is unremarkable.  After multiple hours of observation in the emergency department, patient is now able to ambulate steadily. Her tachycardia has improved. She does not appear to ever be in normal sinus rhythm at baseline, upon chart review. Return precautions given as well as resource guide for outpatient treatment facilities. Patient discharged in stable condition in the care of a sober friend.   Final Clinical Impressions(s) / ED Diagnoses   Final diagnoses:  Alcoholic intoxication without complication (HCC)  Forehead contusion, initial encounter  Left forearm pain    New Prescriptions Discharge Medication List as of 03/07/2016  4:59 AM     I personally performed the services described in this documentation, which was scribed in my presence. The recorded information has been reviewed and is accurate.       Antony MaduraKelly Keymora Grillot, PA-C 03/07/16 0706    Antony MaduraKelly Katheleen Stella, PA-C 03/07/16 [id number]    Doug SouSam Jacubowitz, MD 03/10/16 1210

## 2016-03-07 LAB — I-STAT CHEM 8, ED
BUN: 6 mg/dL (ref 6–20)
Calcium, Ion: 0.93 mmol/L — ABNORMAL LOW (ref 1.15–1.40)
Chloride: 107 mmol/L (ref 101–111)
Creatinine, Ser: 0.7 mg/dL (ref 0.44–1.00)
Glucose, Bld: 89 mg/dL (ref 65–99)
HCT: 33 % — ABNORMAL LOW (ref 36.0–46.0)
Hemoglobin: 11.2 g/dL — ABNORMAL LOW (ref 12.0–15.0)
Potassium: 3.5 mmol/L (ref 3.5–5.1)
Sodium: 144 mmol/L (ref 135–145)
TCO2: 23 mmol/L (ref 0–100)

## 2016-03-07 MED ORDER — SODIUM CHLORIDE 0.9 % IV BOLUS (SEPSIS)
1000.0000 mL | Freq: Once | INTRAVENOUS | Status: AC
  Administered 2016-03-07: 1000 mL via INTRAVENOUS

## 2016-03-07 MED ORDER — LORAZEPAM 2 MG/ML IJ SOLN
1.0000 mg | Freq: Once | INTRAMUSCULAR | Status: AC
  Administered 2016-03-07: 1 mg via INTRAVENOUS
  Filled 2016-03-07: qty 1

## 2016-03-07 NOTE — ED Notes (Signed)
Bed: WA07 Expected date:  Expected time:  Means of arrival:  Comments: 

## 2016-03-07 NOTE — ED Notes (Signed)
Pt ambulating back to room from bathroom with visitor. Pt ambulating with minimal assistance. Pt settled back into room and given beverage and sandwich at this time.

## 2016-03-07 NOTE — ED Provider Notes (Signed)
Patient reports she fell earlier today. She admits to drinking alcohol earlier today. She complains of mild headache and left forearm pain. She denies abdominal pain. Denies knee pain. She feels mildly tremulous. Patient is alert us go, score 15 HEENT exam normocephalic no facial asymmetry. Neck supple trachea midline no point tenderness lungs clear equal breath sounds heart mildly tachycardic regular rhythm abdomen normal active bowel sounds nontender no contusion. Neurologic Glasgow Coma Score 15 moves all extremities well gait normal. Lightheaded on standing.   Doug SouSam Kaleiyah Polsky, MD 03/07/16 [id number]

## 2016-03-08 ENCOUNTER — Emergency Department (HOSPITAL_COMMUNITY): Payer: BLUE CROSS/BLUE SHIELD

## 2016-03-08 ENCOUNTER — Encounter (HOSPITAL_COMMUNITY): Payer: Self-pay | Admitting: Emergency Medicine

## 2016-03-08 ENCOUNTER — Emergency Department (HOSPITAL_COMMUNITY)
Admission: EM | Admit: 2016-03-08 | Discharge: 2016-03-08 | Disposition: A | Discharge status: Home / Self Care | Payer: BLUE CROSS/BLUE SHIELD | Attending: Emergency Medicine | Admitting: Emergency Medicine

## 2016-03-08 DIAGNOSIS — Z79899 Other long term (current) drug therapy: Secondary | ICD-10-CM | POA: Diagnosis not present

## 2016-03-08 DIAGNOSIS — F10120 Alcohol abuse with intoxication, uncomplicated: Secondary | ICD-10-CM | POA: Diagnosis not present

## 2016-03-08 DIAGNOSIS — W1839XA Other fall on same level, initial encounter: Secondary | ICD-10-CM | POA: Diagnosis not present

## 2016-03-08 DIAGNOSIS — Y999 Unspecified external cause status: Secondary | ICD-10-CM | POA: Insufficient documentation

## 2016-03-08 DIAGNOSIS — S300XXA Contusion of lower back and pelvis, initial encounter: Secondary | ICD-10-CM

## 2016-03-08 DIAGNOSIS — Y939 Activity, unspecified: Secondary | ICD-10-CM | POA: Diagnosis not present

## 2016-03-08 DIAGNOSIS — Y92002 Bathroom of unspecified non-institutional (private) residence single-family (private) house as the place of occurrence of the external cause: Secondary | ICD-10-CM | POA: Diagnosis not present

## 2016-03-08 DIAGNOSIS — F10920 Alcohol use, unspecified with intoxication, uncomplicated: Secondary | ICD-10-CM

## 2016-03-08 DIAGNOSIS — M549 Dorsalgia, unspecified: Secondary | ICD-10-CM

## 2016-03-08 DIAGNOSIS — S0083XA Contusion of other part of head, initial encounter: Secondary | ICD-10-CM | POA: Insufficient documentation

## 2016-03-08 DIAGNOSIS — S3992XA Unspecified injury of lower back, initial encounter: Secondary | ICD-10-CM | POA: Diagnosis present

## 2016-03-08 DIAGNOSIS — M545 Low back pain, unspecified: Secondary | ICD-10-CM

## 2016-03-08 MED ORDER — IBUPROFEN 200 MG PO TABS
600.0000 mg | ORAL_TABLET | Freq: Once | ORAL | Status: AC
  Administered 2016-03-08: 600 mg via ORAL
  Filled 2016-03-08: qty 3

## 2016-03-08 NOTE — ED Notes (Signed)
Mom called stated on [place] way from [place], will arrive around 6 pm.  Mom states [place] is going to [place]hab in [place] tomorrow. If all possible, hold patient in ER until mom arrives.

## 2016-03-08 NOTE — ED Triage Notes (Signed)
Pt BIB friend for alcohol intoxication and back injury; pt can't remember how she hurt her back; pain is located mid-back; pt appears very intoxicated

## 2016-03-08 NOTE — ED Provider Notes (Signed)
[facility]-EMERGENCY DEPT Provider Note   CSN: [id number] Arrival date & time: 03/08/16  0441     History   Chief Complaint Chief Complaint  Patient presents with  . Back Injury  . Alcohol Intoxication    HPI [name] Stewart is a 36 y.o. female with a PMHx of alcohol related seizures, bipolar, depression, PTSD, chronic headaches, anxiety, and alcoholism, who presents to the ED with complaints of back pain and alcohol intoxication. Level 5 caveat due to intoxication. Patient states that she injured her back when she fell in the bathroom, isn't initially sure when she fell but then recalls that she was seen in the ER afterwards, and chart review reveals she was seen 2 days ago. Chart review reveals she was seen in the ED on 03/06/16 for EtOH intoxication and fall, and had CT head/neck and L forearm xray which were unremarkable. She's had multiple ED visits for EtOH intoxication. She states that she is not sure how she fell or how she injured her back aside from during the fall. She describes the pain is 6/10 constant aching nonradiating lower back pain, worse with movement, improved with rest, and with no treatments tried prior to arrival. She admits to consuming alcohol last night, but cannot tell me how much she drank. She arrives intoxicated. She denies fevers, chills, CP, SOB, abd pain, N/V/D/C, hematuria, dysuria, incontinence of urine/stool, saddle anesthesia/cauda equina symptoms, myalgias, arthralgias, numbness, tingling, focal weakness, or any other complaints at this time.    The history is provided by the patient and medical records. No language interpreter was used.  Alcohol Intoxication  Pertinent negatives include no chest pain, no abdominal pain and no shortness of breath.  Back Pain   This is a new problem. The current episode started 2 days ago. The problem occurs constantly. The problem has not changed since onset.The pain is associated with falling. The pain is present in the lumbar spine.  The quality of the pain is described as aching. The pain does not radiate. The pain is at a severity of 6/10. The pain is moderate. Exacerbated by: movement. The pain is the same all the time. Pertinent negatives include no chest pain, no fever, no numbness, no abdominal pain, no bowel incontinence, no perianal numbness, no bladder incontinence, no dysuria, no paresthesias, no paresis, no tingling and no weakness. She has tried bed rest for the symptoms. The treatment provided mild relief.    Past Medical History:  Diagnosis Date  . Alcohol related seizure (HCC)   . Alcoholic (HCC)   . Anxiety   . Bipolar disorder (HCC)   . Depression   . Post traumatic stress disorder (PTSD)   . Tension headache, chronic    "monthly" (01/31/2015)    Patient Active Problem List   Diagnosis Date Noted  . Withdrawal seizures (HCC) 08/31/2015  . Tachycardia 08/31/2015  . Alcohol intoxication (HCC) 08/13/2015  . Depression 05/29/2015  . Generalized anxiety disorder   . Transaminitis 02/17/2015  . Major depressive disorder, recurrent episode, severe (HCC) 02/05/2015  . PTSD (post-traumatic stress disorder) 12/14/2014  . Bipolar disorder with severe depression (HCC) 12/14/2014  . Alcohol use disorder, severe, dependence (HCC) 12/13/2014  . Metabolic acidosis 01/01/2014  . Alcohol withdrawal (HCC) 01/01/2014  . GERD (gastroesophageal reflux disease) 01/01/2014  . Coarse tremors 01/01/2014    Past Surgical History:  Procedure Laterality Date  . EXCISIONAL HEMORRHOIDECTOMY  20111  . OSTEOCHONDROMA EXCISION Right ~ 1996   "knee"  . REFRACTIVE SURGERY Bilateral ~  2011    OB History    No data available       Home Medications    Prior to Admission medications   Medication Sig Start Date End Date Taking? Authorizing Provider  acamprosate (CAMPRAL) 333 MG tablet Take 666 mg by mouth 3 (three) times daily.  02/21/16  Yes Historical Provider, MD  gabapentin (NEURONTIN) 300 MG capsule Take 300-600 mg  by mouth 2 (two) times daily. 300 mg qam and 600 mg qhs 08/23/15  Yes Historical Provider, MD  lamoTRIgine (LAMICTAL) 200 MG tablet Take 200 mg by mouth daily.   Yes Historical Provider, MD  Levonorgestrel-Ethinyl Estradiol (CAMRESE) 0.15-0.03 &0.01 MG tablet Take 1 tablet by mouth daily.   Yes Historical Provider, MD  lurasidone (LATUDA) 40 MG TABS tablet Take 40 mg by mouth every evening.  02/21/16 03/22/16 Yes Historical Provider, MD  Multiple Vitamin (MULTIVITAMIN WITH MINERALS) TABS tablet Take 1 tablet by mouth daily. 02/19/15  Yes Carly Arlyce Harman, MD  naltrexone (DEPADE) 50 MG tablet Take 25 mg by mouth daily.  02/21/16 02/20/17 Yes Historical Provider, MD  naphazoline-glycerin (CLEAR EYES) 0.012-0.2 % SOLN Place 1 drop into both eyes 4 (four) times daily as needed for irritation.   Yes Historical Provider, MD  ondansetron (ZOFRAN) 4 MG tablet Take 1 tablet (4 mg total) by mouth every 8 (eight) hours as needed for nausea or vomiting. Patient taking differently: Take 4 mg by mouth at bedtime.  01/12/16  Yes Antony Madura, PA-C  topiramate (TOPAMAX) 50 MG tablet Take 50 mg by mouth 2 (two) times daily. 10/22/15  Yes Historical Provider, MD  valACYclovir (VALTREX) 500 MG tablet Take 1,000 mg by mouth daily.  07/12/15  Yes Historical Provider, MD  chlordiazePOXIDE (LIBRIUM) 25 MG capsule 50mg  PO TID x 1D, then 25-50mg  PO BID X 1D, then 25-50mg  PO QD X 1D Patient not taking: Reported on 03/08/2016 02/26/16   Cheri Fowler, PA-C  folic acid (FOLVITE) 1 MG tablet Take 1 tablet (1 mg total) by mouth daily. Patient not taking: Reported on 01/11/2016 02/19/15   Su Hoff, MD  metoprolol tartrate (LOPRESSOR) 50 MG tablet Take 1 tablet (50 mg total) by mouth 2 (two) times daily. Patient not taking: Reported on 01/11/2016 08/14/15   Leroy Sea, MD  pantoprazole (PROTONIX) 40 MG tablet Take 1 tablet (40 mg total) by mouth daily. Patient not taking: Reported on 01/11/2016 08/14/15   Leroy Sea, MD  thiamine  100 MG tablet Take 1 tablet (100 mg total) by mouth daily. Patient not taking: Reported on 01/11/2016 02/19/15   Su Hoff, MD    Family History Family History  Problem Relation Age of Onset  . Anxiety disorder Mother   . Anxiety disorder Sister   . Anxiety disorder Maternal Grandmother   . Anxiety disorder Father   . Alcohol abuse Father   . Alcohol abuse Brother   . Alcohol abuse Paternal Uncle     Social History Social History  Substance Use Topics  . Smoking status: Never Smoker  . Smokeless tobacco: Never Used  . Alcohol use Yes     Comment: Wine and Vodka. Last drink: Today     Allergies   Naltrexone and Baclofen   Review of Systems Review of Systems  Constitutional: Negative for chills and fever.  Respiratory: Negative for shortness of breath.   Cardiovascular: Negative for chest pain.  Gastrointestinal: Negative for abdominal pain, bowel incontinence, constipation, diarrhea, nausea and vomiting.  Genitourinary: Negative for  bladder incontinence, dysuria and hematuria.  Musculoskeletal: Positive for back pain. Negative for arthralgias and myalgias.  Skin: Negative for color change.  Allergic/Immunologic: Negative for immunocompromised state.  Neurological: Negative for tingling, weakness, numbness and paresthesias.  Hematological: Does not bruise/bleed easily.   Level 5 caveat due to intoxication  Physical Exam Updated Vital Signs BP 109/84 (BP Location: Left Arm)   Pulse 104   Temp 98.1 F (36.7 C) (Oral)   Resp 16   LMP  (LMP Unknown) Comment: cannot remember last period, states no chance of pregnancy  SpO2 95%    Physical Exam  Constitutional: She is oriented to person, place, and time. Vital signs are normal. She appears well-developed and well-nourished. She is cooperative. She is easily aroused.  Non-toxic appearance. No distress.  Afebrile, nontoxic, NAD, sleeping but easily aroused, appears intoxicated however able to cooperate with most of  the exam  HENT:  Head: Normocephalic. Head is with contusion.    Mouth/Throat: Oropharynx is clear and moist and mucous membranes are normal.  Small contusion to R forehead, as noted in prior exam from last ED visit, no scalp crepitus or deformity  Eyes: Conjunctivae and EOM are normal. Right eye exhibits no discharge. Left eye exhibits no discharge.  Neck: Normal range of motion. Neck supple. No spinous process tenderness and no muscular tenderness present. No neck rigidity. Normal range of motion present.  FROM intact without spinous process TTP, no bony stepoffs or deformities, no paraspinous muscle TTP or muscle spasms. No rigidity or meningeal signs. No bruising or swelling.   Cardiovascular: Normal rate, regular rhythm, normal heart sounds and intact distal pulses.  Exam reveals no gallop and no friction rub.   No murmur heard. Pulmonary/Chest: Effort normal and breath sounds normal. No respiratory distress. She has no decreased breath sounds. She has no wheezes. She has no rhonchi. She has no rales.  Abdominal: Soft. Normal appearance and bowel sounds are normal. She exhibits no distension. There is no tenderness. There is no rigidity, no rebound, no guarding, no CVA tenderness, no tenderness at McBurney's point and negative Murphy's sign.  Musculoskeletal:       Thoracic back: She exhibits tenderness, bony tenderness and swelling. She exhibits normal range of motion, no deformity and no spasm.       Lumbar back: She exhibits tenderness, bony tenderness and swelling. She exhibits normal range of motion, no deformity and no spasm.       Back:  Thoracic and lumbar spine with FROM intact with mild midline spinous process TTP from lower T-spine to upper L-spine area overlying a small bruise and swollen area, no bony stepoffs or deformities, no paraspinous muscle TTP or muscle spasms. Small old healing bruise to L lower back/posterior hip as well which is nonTTP. Strength and sensation grossly  intact in all extremities, negative SLR bilaterally, gait nonantalgic. Distal pulses intact.   Neurological: She is oriented to person, place, and time and easily aroused. She has normal strength. No sensory deficit.  Intoxicated however once aroused, pt is A&O with no focal neuro deficits  Skin: Skin is warm, dry and intact. Bruising noted. No rash noted.  Small bruise to back as mentioned above, small contusion to R forehead as mentioned above  Psychiatric: She has a normal mood and affect.  Nursing note and vitals reviewed.    ED Treatments / Results  Labs (all labs ordered are listed, but only abnormal results are displayed) Labs Reviewed - No data to display  EKG  EKG Interpretation None       Radiology Dg Thoracic Spine W/swimmers  Result Date: 03/08/2016 CLINICAL DATA:  Back injury.  Alcohol intoxication. EXAM: THORACIC SPINE - 3 VIEWS COMPARISON:  None. FINDINGS: There is no evidence of thoracic spine fracture. Alignment is normal. No other significant bone abnormalities are identified. IMPRESSION: Negative. Electronically Signed   By: Elsie Stain M.D.   On: 03/08/2016 08:02   Dg Lumbar Spine Complete  Result Date: 03/08/2016 CLINICAL DATA:  Low back pain.  Alcohol intoxication. EXAM: LUMBAR SPINE - COMPLETE 4+ VIEW COMPARISON:  06/01/2015. FINDINGS: There is no evidence of lumbar spine fracture. Alignment is normal without subluxation. No pars defects. Intervertebral disc spaces are maintained. IMPRESSION: Negative.  Stable exam. Electronically Signed   By: Elsie Stain M.D.   On: 03/08/2016 08:04   Dg Forearm Left  Result Date: 03/06/2016 CLINICAL DATA:  Pain after fall EXAM: LEFT FOREARM - 2 VIEW COMPARISON:  None. FINDINGS: There is no evidence of fracture or other focal bone lesions. Soft tissues are unremarkable. IMPRESSION: Negative. Electronically Signed   By: Tollie Eth M.D.   On: 03/06/2016 22:37   Ct Head Wo Contrast  Result Date: 03/06/2016 CLINICAL DATA:   36 y/o F; history of alcoholism presenting after a fall with complaints of headache and left upper extremity pain. EXAM: CT HEAD WITHOUT CONTRAST CT CERVICAL SPINE WITHOUT CONTRAST TECHNIQUE: Multidetector CT imaging of the head and cervical spine was performed following the standard protocol without intravenous contrast. Multiplanar CT image reconstructions of the cervical spine were also generated. COMPARISON:  01/11/2016 CT head.  08/29/2015 CT cervical spine. FINDINGS: CT HEAD FINDINGS Brain: No evidence of acute infarction, hemorrhage, hydrocephalus, extra-axial collection or mass lesion/mass effect. Vascular: No hyperdense vessel or unexpected calcification. Skull: Right frontal scalp thinning and soft tissue probably representing scarring or skin lesion, stable from prior study. No large scalp hematoma. No displaced calvarial fracture. Sinuses/Orbits: No acute finding. Other: High-riding left jugular bulb with thin sigmoid plate. CT CERVICAL SPINE FINDINGS Alignment: Normal. Skull base and vertebrae: No acute fracture. No primary bone lesion or focal pathologic process. Soft tissues and spinal canal: No prevertebral fluid or swelling. No visible canal hematoma. Disc levels:  No significant cervical degenerative changes. Upper chest: Negative. Other: Negative. IMPRESSION: 1. No acute intracranial abnormality identified. 2. Right frontal scalp thinning and soft tissue probably representing scarring or skin lesion, stable from prior study. 3. No displaced calvarial fracture. 4. No acute fracture or dislocation of cervical spine. Electronically Signed   By: Mitzi Hansen M.D.   On: 03/06/2016 22:50   Ct Cervical Spine Wo Contrast  Result Date: 03/06/2016 CLINICAL DATA:  36 y/o F; history of alcoholism presenting after a fall with complaints of headache and left upper extremity pain. EXAM: CT HEAD WITHOUT CONTRAST CT CERVICAL SPINE WITHOUT CONTRAST TECHNIQUE: Multidetector CT imaging of the head and  cervical spine was performed following the standard protocol without intravenous contrast. Multiplanar CT image reconstructions of the cervical spine were also generated. COMPARISON:  01/11/2016 CT head.  08/29/2015 CT cervical spine. FINDINGS: CT HEAD FINDINGS Brain: No evidence of acute infarction, hemorrhage, hydrocephalus, extra-axial collection or mass lesion/mass effect. Vascular: No hyperdense vessel or unexpected calcification. Skull: Right frontal scalp thinning and soft tissue probably representing scarring or skin lesion, stable from prior study. No large scalp hematoma. No displaced calvarial fracture. Sinuses/Orbits: No acute finding. Other: High-riding left jugular bulb with thin sigmoid plate. CT CERVICAL SPINE FINDINGS Alignment: Normal. Skull base  and vertebrae: No acute fracture. No primary bone lesion or focal pathologic process. Soft tissues and spinal canal: No prevertebral fluid or swelling. No visible canal hematoma. Disc levels:  No significant cervical degenerative changes. Upper chest: Negative. Other: Negative. IMPRESSION: 1. No acute intracranial abnormality identified. 2. Right frontal scalp thinning and soft tissue probably representing scarring or skin lesion, stable from prior study. 3. No displaced calvarial fracture. 4. No acute fracture or dislocation of cervical spine. Electronically Signed   By: Mitzi HansenLance  Furusawa-Stratton M.D.   On: 03/06/2016 22:50    Procedures Procedures (including critical care time)  Medications Ordered in ED Medications  ibuprofen (ADVIL,MOTRIN) tablet 600 mg (600 mg Oral Given 03/08/16 0758)     Initial Impression / Assessment and Plan / ED Course  I have reviewed the triage vital signs and the nursing notes.  Pertinent labs & imaging results that were available during my care of the patient were reviewed by me and considered in my medical decision making (see chart for details).     36 y.o. female here with EtOH intoxication and c/o lower  back pain from a fall 2 days ago. On exam, appears intoxicated but able to cooperate with exam and ambulate without assistance. No red flag s/s of low back pain. No s/s of central cord compression or cauda equina. Lower extremities are neurovascularly intact and patient is ambulating, although there is midline tenderness to the lower T-spine/upper L-spine area with a small amount of bruising and swelling over the spinous processes. Will obtain xrays and give ibuprofen, then reassess shortly. Of note, she has a small bruise/swollen area to the R forehead, was evaluated 2 days ago and had head/neck CT imaging which was negative, doubt need for repeat imaging.  10:26 AM Xrays negative, pt more sobered up at this time. Discussed that this is likely a contusion; alcohol cessation strongly recommended. Patient was counseled on back pain precautions and told to do activity as tolerated but do not lift, push, or pull heavy objects more than 10 pounds for the next week. Patient counseled to use ice or heat on back for no longer than 15 minutes every hour. Advised tylenol and motrin use as well. Outpatient resources for alcohol abuse given; however pt clinically sober and ambulatory without difficulty so she is stable for discharge at this time.  Patient urged to follow-up with PCP if pain does not improve with treatment and rest or if pain becomes recurrent. Urged to return with worsening severe pain, loss of bowel or bladder control, trouble walking. List of resources for alcohol abuse given. The patient verbalizes understanding and agrees with the plan.   Final Clinical Impressions(s) / ED Diagnoses   Final diagnoses:  Back pain  Alcoholic intoxication without complication (HCC)  Acute midline low back pain without sciatica  Contusion of lower back, initial encounter    New Prescriptions New Prescriptions   No medications on file     [address]Horace Wishon, PA-C 03/08/16 1027    Heide Scaleshristopher J Tegeler,  MD 03/09/16 [phone]

## 2016-03-08 NOTE — ED Notes (Signed)
Pt's friend RAY states for pt to call him at discharge for a ride

## 2016-03-08 NOTE — Discharge Instructions (Signed)
STOP DRINKING ALCOHOL! This is not helping your situation, and causing you to have repeated falls and injuries, as well as repeated visits to the ER. For your back pain, see the instructions below for detailed management options, but use ice and/or heat to the areas of soreness no more than 20 minutes per hour, alternate between tylenol and motrin as needed for pain, and follow up with your primary care doctor in 1 week for recheck of symptoms. Return to the ER for emergent changes or worsening symptoms  Back Pain: Your back pain should be treated with medicines such as ibuprofen or aleve and this back pain should get better over the next 2 weeks.  However if you develop severe or worsening pain, low back pain with fever, numbness, weakness or inability to walk or urinate, you should return to the ER immediately.  Please follow up with your doctor this week for a recheck if still having symptoms.  Avoid heavy lifting over 10 pounds over the next two weeks.  Low back pain is discomfort in the lower back that may be due to injuries to muscles and ligaments around the spine.  Occasionally, it may be caused by a a problem to a part of the spine called a disc.  The pain may last several days or a week;  However, most patients get completely well in 4 weeks.  Self - care:  The application of ice and heat can help soothe the pain.  Maintaining your daily activities, including walking, is encourged, as it will help you get better faster than just staying in bed. Perform gentle stretching as discussed. Drink plenty of fluids.  Medications are also useful to help with pain control.  A commonly prescribed medication includes over the counter tylenol; take as directed on the bottle.  Non steroidal anti inflammatory medications including Ibuprofen and naproxen;  These medications help both pain and swelling and are very useful in treating back pain.  They should be taken with food, as they can cause stomach upset, and  more seriously, stomach bleeding.    SEEK IMMEDIATE MEDICAL ATTENTION IF: New numbness, tingling, weakness, or problem with the use of your arms or legs.  Severe back pain not relieved with medications.  Difficulty with or loss of control of your bowel or bladder control.  Increasing pain in any areas of the body (such as chest or abdominal pain).  Shortness of breath, dizziness or fainting.  Nausea (feeling sick to your stomach), vomiting, fever, or sweats.  You will need to follow up with  Your primary healthcare provider in 1-2 weeks for reassessment.

## 2016-05-17 IMAGING — DX DG LUMBAR SPINE 2-3V
3 series · 3 of 3 positions shown · non-contrast
Comparison: No recent prior .

CLINICAL DATA: Back pain .

EXAM:
LUMBAR SPINE - 2-3 VIEW

[l-spine ap]
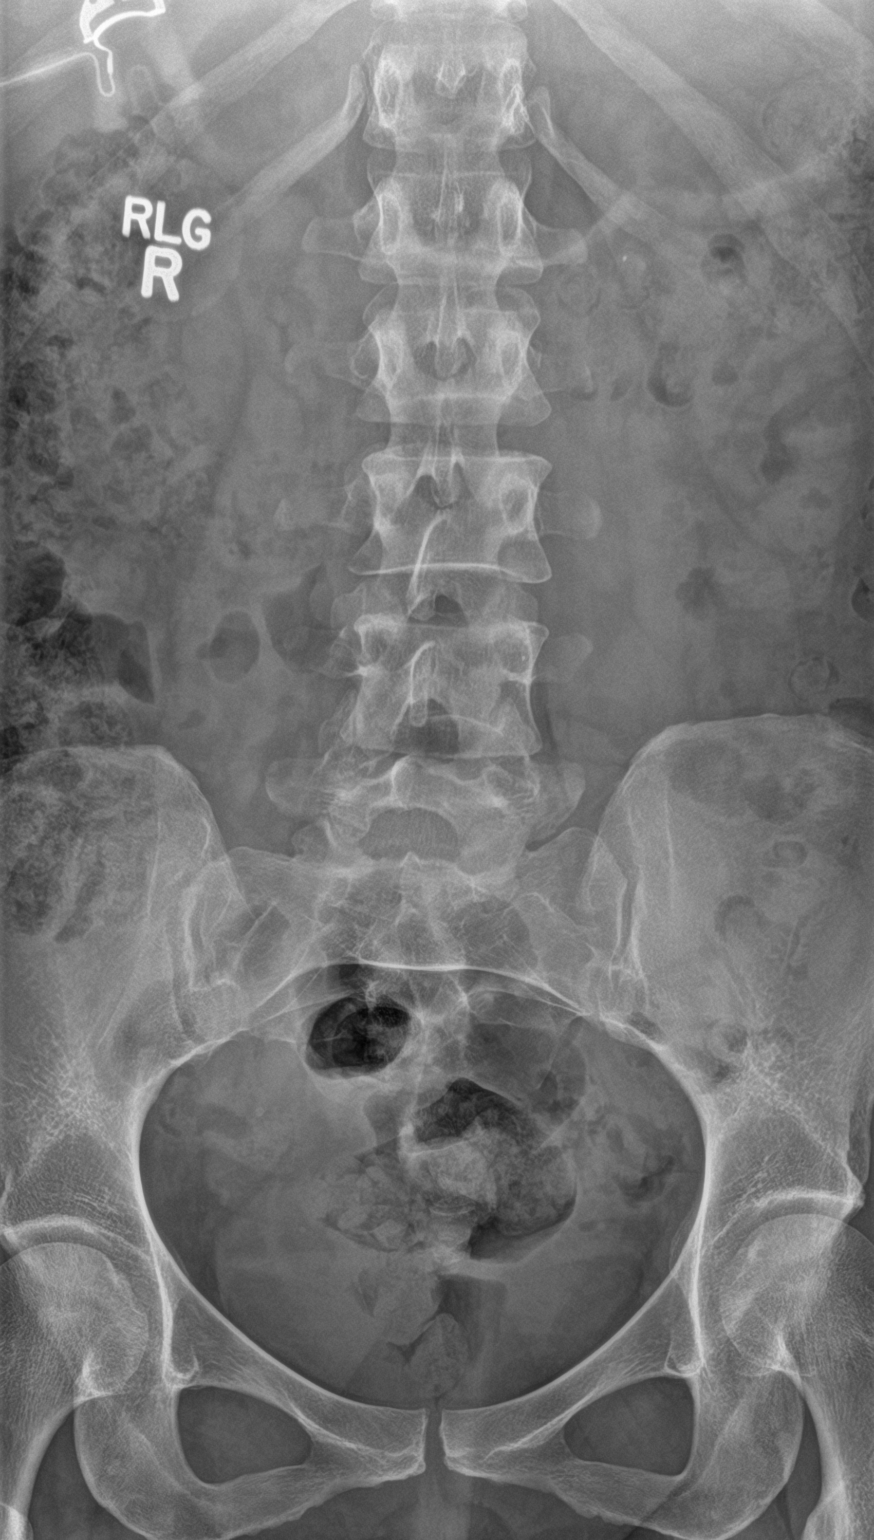

[l-spine lat]
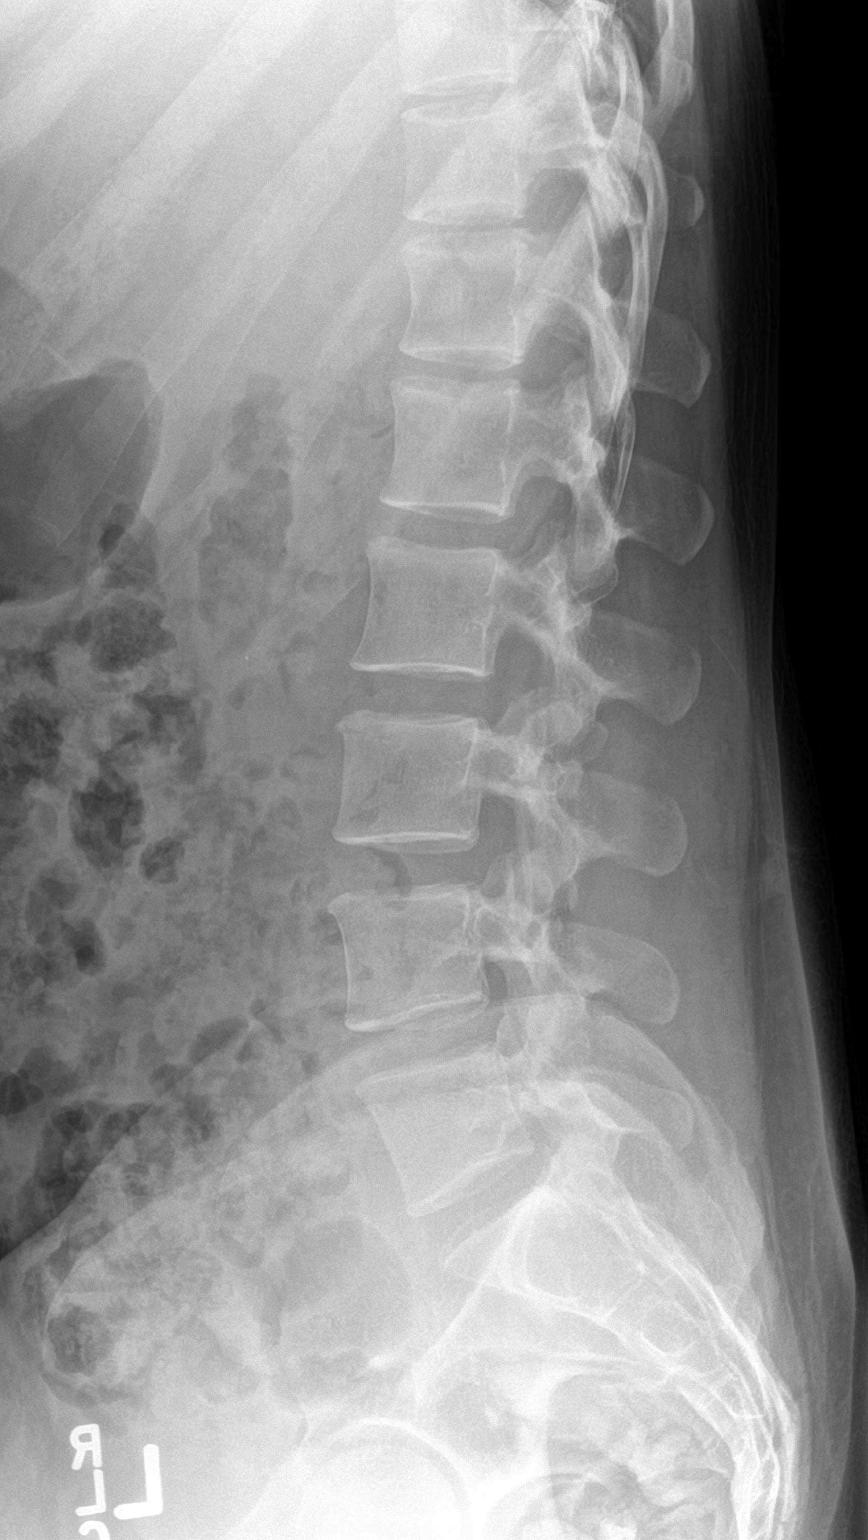

[l-spine spot]
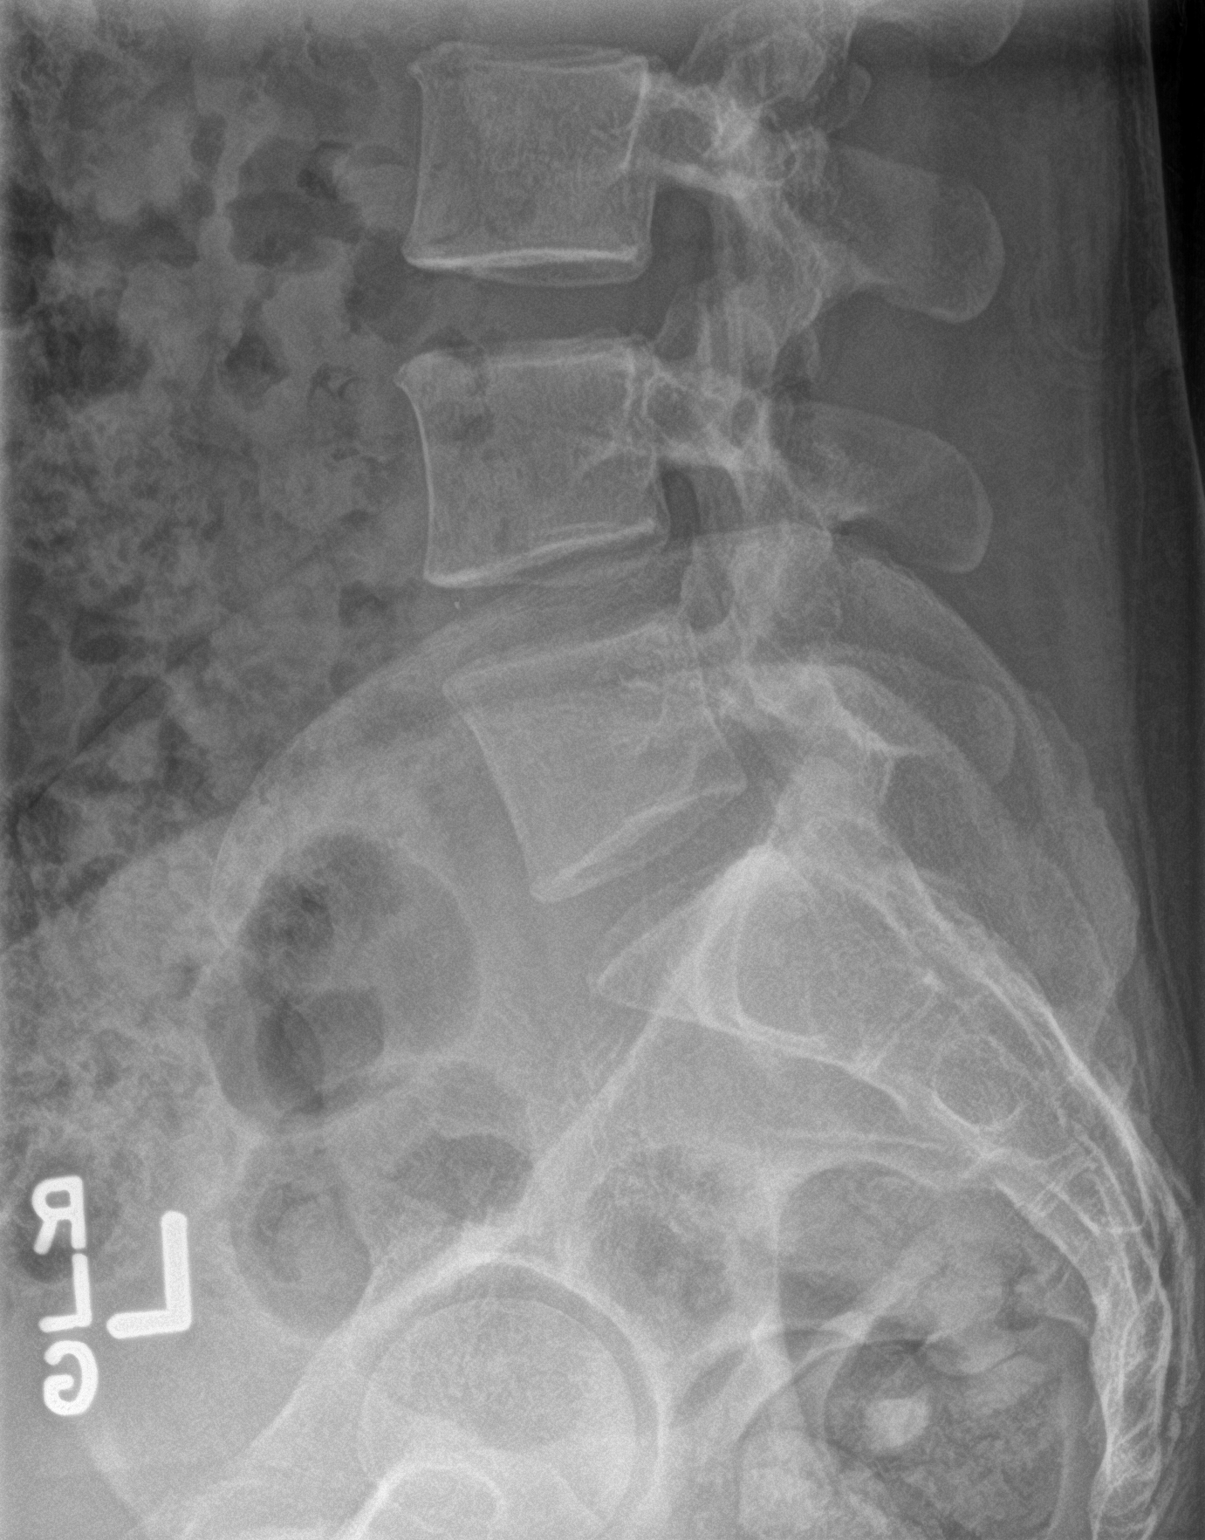

[3 of 3 positions shown; findings below may reference images not displayed]

FINDINGS: Multilevel mild degenerative change. No acute bony abnormality
identified. Normal Mild scoliosis concave right. Normal
mineralization.
IMPRESSION: Mild scoliosis concave right.  No acute abnormality .
